# Patient Record
Sex: Female | Born: 1985 | Race: White | Hispanic: No | Marital: Married | Smoking: Never smoker
Health system: Southern US, Community
[De-identification: ages and names within clinical notes are randomized; demographics above are authoritative.]

## PROBLEM LIST (undated history)

## (undated) ENCOUNTER — Inpatient Hospital Stay (HOSPITAL_COMMUNITY): Payer: Self-pay

## (undated) DIAGNOSIS — E119 Type 2 diabetes mellitus without complications: Secondary | ICD-10-CM

## (undated) DIAGNOSIS — I10 Essential (primary) hypertension: Secondary | ICD-10-CM

## (undated) HISTORY — DX: Essential (primary) hypertension: I10

## (undated) HISTORY — PX: WISDOM TOOTH EXTRACTION: SHX21

## (undated) HISTORY — DX: Type 2 diabetes mellitus without complications: E11.9

---
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
## 2012-08-11 DIAGNOSIS — M25859 Other specified joint disorders, unspecified hip: Secondary | ICD-10-CM | POA: Insufficient documentation

## 2012-08-11 DIAGNOSIS — M7070 Other bursitis of hip, unspecified hip: Secondary | ICD-10-CM | POA: Insufficient documentation

## 2012-08-11 DIAGNOSIS — S73199A Other sprain of unspecified hip, initial encounter: Secondary | ICD-10-CM | POA: Insufficient documentation

## 2012-08-11 DIAGNOSIS — M25559 Pain in unspecified hip: Secondary | ICD-10-CM | POA: Insufficient documentation

## 2013-05-16 DIAGNOSIS — J343 Hypertrophy of nasal turbinates: Secondary | ICD-10-CM | POA: Insufficient documentation

## 2014-02-09 HISTORY — PX: DE QUERVAIN'S RELEASE: SHX1439

## 2014-03-14 DIAGNOSIS — E109 Type 1 diabetes mellitus without complications: Secondary | ICD-10-CM | POA: Insufficient documentation

## 2014-03-14 DIAGNOSIS — H30042 Focal chorioretinal inflammation, macular or paramacular, left eye: Secondary | ICD-10-CM | POA: Insufficient documentation

## 2014-03-30 DIAGNOSIS — E785 Hyperlipidemia, unspecified: Secondary | ICD-10-CM | POA: Insufficient documentation

## 2015-02-18 ENCOUNTER — Other Ambulatory Visit: Payer: Self-pay

## 2015-02-18 VITALS — BP 122/88 | HR 88 | Resp 16 | Ht 64.0 in | Wt 184.0 lb

## 2015-02-18 DIAGNOSIS — I10 Essential (primary) hypertension: Secondary | ICD-10-CM | POA: Insufficient documentation

## 2015-02-18 DIAGNOSIS — E109 Type 1 diabetes mellitus without complications: Secondary | ICD-10-CM

## 2015-02-18 NOTE — Patient Instructions (Signed)
1. Plan to meet with Jenita SeashoreBev Paddock RD at [facility] 03/14/15.   2. Plan to eat 30-45 GM (2-3) servings of carbohydrate a meal and 15 GM for snacks.  Plan to eat protein with your snacks 3. Plan to check blood sugars fasting and before meal, bedtime and as needed.  Goals of 80-130 fasting and 180 or less after eating. 4. Plan to do work out DVD twice a week for 90 minutes 5. Plan to see Dr. Claiborne Riggber on 02/19/15 6. Plan to return to [facility] on 04/23/15 at [facility]4PM

## 2015-02-18 NOTE — Patient Outreach (Signed)
[facility]) Care Management   02/18/2015  Valerie ButtStephanie Horwitz Mar 26, 1985 [id number]  Valerie ButtStephanie Wachob is an 30 y.o. female.  Member seen for initial office visit for Link to Wellness program for self management of Type 1 diabetes  Subjective: Member states that she would like to get back on the insulin pump and get her blood sugars under better control as she and her husband are wanting to start a family.  States she was on the pump in the past and she had better control when she was on the pump.  States it has been years since she was on a pump and she would like to get the new Medtronic pump if possible.  States that her blood sugars are higher during the day and low in the morning.  States she is to see her MD tomorrow to discuss her blood sugar levels and to talk about getting started on the pump.  States she does give herself a correction bolus of insulin every time she eats and she feels is doing a good job of counting her CHO.  States that she needs to watch her portion sizes at dinner.  States she had been running 3 times a week before the holidays but she has stopped doing much exercise in the last month or so.    Objective:   Review of Systems  All other systems reviewed and are negative.   Physical Exam Today's Vitals   02/18/15 1413  BP: 122/88  Pulse: 88  Resp: 16  Height: 1.626 m (5\' 4" )  Weight: 184 lb (83.462 kg)  SpO2: 99%  PainSc: 0-No pain   Current Medications:   Current Outpatient Prescriptions  Medication Sig Dispense Refill  . insulin aspart (NOVOLOG) 100 UNIT/ML injection Inject 1 Units into the skin 3 (three) times daily with meals. Amount varies on blood sugar and CHO correction    . insulin glargine (LANTUS) 100 UNIT/ML injection Inject 50 Units into the skin at bedtime.    . methyldopa (ALDOMET) 250 MG tablet Take 250 mg by mouth 2 (two) times daily.    . mometasone (NASONEX) 50 MCG/ACT nasal spray Place 2 sprays into the nose daily.     No  current facility-administered medications for this visit.    Functional Status:   In your present state of health, do you have any difficulty performing the following activities: 02/18/2015  Hearing? N  Vision? N  Difficulty concentrating or making decisions? N  Walking or climbing stairs? N  Dressing or bathing? N  Doing errands, shopping? N    Fall/Depression Screening:    PHQ 2/9 Scores 02/18/2015  PHQ - 2 Score 0    Assessment:  Member seen for initial office visit for Link to Wellness program for self management of Type 1 diabetes.  Member is above self management goal of hemoglobin A1C of  7% with last reading 8%.  Member has good knowledge of CHO counting and disease management.  Reports needing to improve blood sugars and to increase exercise.  She desires to become pregnant and would like to get on insulin pump to help improve her glycemic control.  Link to Wellness pharmacy benefits activated as she has good knowledge of diabetes and she has an upcoming appointment with Jenita SeashoreBev Paddock RD, CDE.  Plan:   Plan to meet with Jenita SeashoreBev Paddock RD [facility].   Plan to eat 30-45 GM (2-3) servings of carbohydrate a meal and 15 GM for  snacks.  Plan to eat protein with your snacks Plan to check blood sugars fasting and before meal, bedtime and as needed.  Goals of 80-130 fasting and 180 or less after eating. Plan to do work out DVD twice a week for 90 minutes Plan to see Dr. Claiborne Rigg on 02/19/15 to discuss insulin pump options Plan to return to [facility] to Wellness on 04/23/15 at 4PM  [facility] CM Care Plan Problem One        Most Recent Value   Care Plan Problem One  Elevated blood sugars related to dx of Type 1 DM   Role Documenting the Problem One  Care Management Coordinator   Care Plan for Problem One  Active   [facility] Long Term Goal (31-90 days)  Member will decrease hemoglobin A1C to 7 or below in the next 90 days   [facility] Long Term Goal Start Date  02/18/15    Interventions for Problem One Long Term Goal  Given Link to [facility]ness diabetes education teaching packet and reviewed program requirements, Instructed on CHO counting and portion control, Instructed about the insulin pump benefit with Link to Wellness and encouraged to discuss with MD about going back on the insulin pump, Instructed to keep her appointment with Jenita Seashore RD on 03/14/15 and to have her discuss the process of getting started on the insulin pump, Instructed on importance of having her blood sugars in good control if she is planning in trying to get pregnant, Instructed on importance of regular exercise for glycemic control    [name] RN, [facility] Care Management Coordinator-Link to Wellness [facility] Care Management [phone]

## 2015-02-19 DIAGNOSIS — E1065 Type 1 diabetes mellitus with hyperglycemia: Secondary | ICD-10-CM | POA: Diagnosis not present

## 2015-02-21 MED FILL — ACCU-CHEK MULTICLIX LANCETS: 39 days supply | Qty: 306 | Fill #0

## 2015-02-21 MED FILL — BD PEN NDL NANO 32GX5/32: 32G X 4 MM | 30 days supply | Qty: 300 | Fill #0

## 2015-02-21 MED FILL — CONTOUR NEXT STRIPS: 38 days supply | Qty: 300 | Fill #0

## 2015-02-21 MED FILL — LANTUS SOLOSTAR 100 UNITS/M: 100 | 60 days supply | Qty: 30 | Fill #0

## 2015-02-25 MED FILL — NOVOLOG FLEXPEN SYRINGE: 100 | 30 days supply | Qty: 30 | Fill #0

## 2015-02-25 MED FILL — GLUCAGON 1 MG EMERGENCY KIT: 1 | 30 days supply | Qty: 2 | Fill #0

## 2015-03-05 DIAGNOSIS — E108 Type 1 diabetes mellitus with unspecified complications: Secondary | ICD-10-CM | POA: Diagnosis not present

## 2015-03-14 ENCOUNTER — Encounter: Payer: Self-pay | Admitting: *Deleted

## 2015-03-14 ENCOUNTER — Encounter: Payer: 59 | Attending: Endocrinology | Admitting: *Deleted

## 2015-03-14 DIAGNOSIS — E109 Type 1 diabetes mellitus without complications: Secondary | ICD-10-CM

## 2015-03-14 DIAGNOSIS — Z713 Dietary counseling and surveillance: Secondary | ICD-10-CM | POA: Diagnosis not present

## 2015-03-20 MED FILL — MOMETASONE FUROATE 50 MCG S: 50 | 30 days supply | Qty: 17 | Fill #0

## 2015-03-20 NOTE — Progress Notes (Signed)
CGM Training:  Appt start time: 1700 end time:1830.  Assessment:  Primary concerns today: Patient here for initiation of Medtronic Continuous Glucose Monitoring.   Medications: see list     Intervention:   Understanding Glucose Sensing Programming Sensor Information  High Glucose: 300 mg/dl  OFF  Low Glucose 80 mg/dl  ON  Other settings to be added at follow up visit Starting [employer] of Product  Entering BG, Calibration Technique and Graphs with Public librarian and Alarms   Follow Up Patient to see MD for insulin dose adjustments and to schedule visit with me for CGM follow up within 2 week(s).

## 2015-03-20 NOTE — Progress Notes (Signed)
Diabetes Self-Management Education  Visit Type: First/Initial  Appt. Start Time: 1500 Appt. End Time: 1630  03/20/2015  Ms. Valerie Dennis, identified by name and date of birth, is a 30 y.o. female with a diagnosis of Diabetes: Type 1.   ASSESSMENT  Last menstrual period 01/11/2015. There is no weight on file to calculate BMI.      Diabetes Self-Management Education - 03/14/15 1505    Visit Information   Visit Type First/Initial   Initial Visit   Diabetes Type Type 1   Are you currently following a meal plan? No   Are you taking your medications as prescribed? Yes   Date Diagnosed age 53, 13 years ago   Health Coping   How would you rate your overall health? Fair   Psychosocial Assessment   Patient Belief/Attitude about Diabetes Defeat/Burnout   Self-care barriers None   Self-management support Family   Other persons present Patient;Spouse/SO   Patient Concerns Glycemic Control   Special Needs None   Preferred Learning Style Auditory   What is the last grade level you completed in school? Bachelor's degree   Complications   Last HgB A1C per patient/outside source 8 %   How often do you check your blood sugar? > 4 times/day   Number of hypoglycemic episodes per month 20   Can you tell when your blood sugar is low? Yes  sometimes not   What do you do if your blood sugar is low? juice box 4 oz   Have you had a dilated eye exam in the past 12 months? Yes   Have you had a dental exam in the past 12 months? Yes   Are you checking your feet? Yes   How many days per week are you checking your feet? 7   Dietary Intake   Breakfast usually - egg and cheese croissant at work, occasionally grapes   Snack (morning) no   Lunch [place] sandwich OR soup,    Snack (afternoon) no   Dinner cooking at home more often; lean meat, steamable vegetables,    Beverage(s) diet Coke, diet Sprite anytime after lunch, flavored carbonated water, wine   Exercise   Exercise Type Light (walking /  raking leaves)   How many days per week to you exercise? 2   How many minutes per day do you exercise? 60   Total minutes per week of exercise 120   Patient Education   Previous Diabetes Education Yes (please comment)  when diagnosed 13 years ago   Disease state  Definition of diabetes, type 1 and 2, and the diagnosis of diabetes   Nutrition management  Role of diet in the treatment of diabetes and the relationship between the three main macronutrients and blood glucose level   Physical activity and exercise  Role of exercise on diabetes management, blood pressure control and cardiac health.   Medications Other (comment)  On insulin pump, would like to start CGM ASAP   Preconception care Reviewed with patient blood glucose goals with pregnancy   Individualized Goals (developed by patient)   Nutrition Other (comment)  Confortable with Carb Counting in grams, introduced Carb Counting with Food Groups for restaurant meals   Medications take my medication as prescribed   Monitoring  test blood glucose pre and post meals as discussed  Plans to start CGM today if possible   Health Coping Other (comment)  Patient states she has good support from her husband who was here today too.   Outcomes  Expected Outcomes Demonstrated interest in learning. Expect positive outcomes   Future DMSE 2 wks   Program Status Not Completed      Individualized Plan for Diabetes Self-Management Training:   Learning Objective:  Patient will have a greater understanding of diabetes self-management. Patient education plan is to attend individual and/or group sessions per assessed needs and concerns.   Plan:   Patient Instructions  Plan:  Continue to count your carbs and enter into insulin pump for accurate bolusing at meals  Include protein in moderation with your meals and snacks Continue reading food labels for Total Carbohydrate of foods Continue checking BG at alternate times per day as directed by MD   Continue taking medication as directed by MD Consider attending DM 1/Pump Support Group meetings for ongoing education and social support.       Expected Outcomes:  Demonstrated interest in learning. Expect positive outcomes  Education material provided: Living Well with Diabetes, A1C conversion sheet, Meal plan card, Support group flyer and Carbohydrate counting sheet  If problems or questions, patient to contact team via:  Phone and Email  Future DSME appointment: 2 wks  Patient plans to come back in 1 hour for training on CGM today.

## 2015-03-20 NOTE — Patient Instructions (Signed)
Plan:  Continue to count your carbs and enter into insulin pump for accurate bolusing at meals  Include protein in moderation with your meals and snacks Continue reading food labels for Total Carbohydrate of foods Continue checking BG at alternate times per day as directed by MD  Continue taking medication as directed by MD Consider attending DM 1/Pump Support Group meetings for ongoing education and social support.

## 2015-03-25 MED FILL — NOVOLOG FLEXPEN SYRINGE: 100 | 30 days supply | Qty: 30 | Fill #1

## 2015-03-25 MED FILL — ACCU-CHEK MULTICLIX LANCETS: 39 days supply | Qty: 306 | Fill #1

## 2015-03-25 MED FILL — CONTOUR NEXT STRIPS: 38 days supply | Qty: 300 | Fill #1

## 2015-03-25 MED FILL — BD PEN NDL NANO 32GX5/32: 32G X 4 MM | 30 days supply | Qty: 300 | Fill #1

## 2015-04-04 ENCOUNTER — Ambulatory Visit: Payer: 59 | Admitting: *Deleted

## 2015-04-22 MED FILL — BD PEN NDL NANO 32GX5/32: 32G X 4 MM | 30 days supply | Qty: 300 | Fill #2

## 2015-04-22 MED FILL — ACCU-CHEK MULTICLIX LANCETS: 39 days supply | Qty: 306 | Fill #2

## 2015-04-22 MED FILL — CONTOUR NEXT STRIPS: 38 days supply | Qty: 300 | Fill #2

## 2015-04-22 MED FILL — MOMETASONE FUROATE 50 MCG S: 50 | 30 days supply | Qty: 17 | Fill #1

## 2015-04-22 MED FILL — LANTUS SOLOSTAR 100 UNITS/M: 100 | 60 days supply | Qty: 30 | Fill #1

## 2015-04-22 MED FILL — NOVOLOG FLEXPEN SYRINGE: 100 | 30 days supply | Qty: 30 | Fill #2

## 2015-04-23 ENCOUNTER — Other Ambulatory Visit: Payer: Self-pay

## 2015-04-23 VITALS — BP 110/60 | HR 84 | Resp 16 | Ht 64.0 in | Wt 189.2 lb

## 2015-04-23 DIAGNOSIS — E109 Type 1 diabetes mellitus without complications: Secondary | ICD-10-CM

## 2015-04-23 NOTE — Patient Outreach (Signed)
[facility]) Care Management   04/23/2015  Valerie Dennis 02-Jul-1985 [id number]  Valerie Dennis is an 30 y.o. female.   Member seen for follow up office visit for Link to Wellness program for self management of Type 1 diabetes  Subjective: Member states that she now has her Medtronic insulin pump and continuous glucose monitor.  States that Pul[employer] Homes[employer] Paddock Dennis helped her get started again on the pump.  States that the first few weeks were an adjustment for using the continuous glucose monitor and getting her alarms set.  States that she is having less lows and high readings now.  States she does not have a follow up appointment with the Dennis and she needs to call to schedule.  States she has been exercising more and she is doing her workout videos twice a week.  States she did go [place] class at [place] and she plans to start running again when the weather is warmer.  Objective:   Review of Systems  All other systems reviewed and are negative.   Physical Exam Today's Vitals   04/23/15 1606  BP: 110/60  Pulse: 84  Resp: 16  Height: 1.626 m (5\' 4" )  Weight: 189 lb 3.2 oz (85.821 kg)  SpO2: 98%  PainSc: 0-No pain   Current Medications:   Current Outpatient Prescriptions  Medication Sig Dispense Refill  . glucagon (GLUCAGON EMERGENCY) 1 MG injection Inject 1 mg into the vein once as needed.    . insulin aspart (NOVOLOG) 100 UNIT/ML injection Inject 1 Units into the skin continuous. Insulin pump    . methyldopa (ALDOMET) 250 MG tablet Take 250 mg by mouth 2 (two) times daily.    . mometasone (NASONEX) 50 MCG/ACT nasal spray Place 2 sprays into the nose daily.    . insulin glargine (LANTUS) 100 UNIT/ML injection Inject 50 Units into the skin at bedtime. Reported on 04/23/2015     No current facility-administered medications for this visit.    Functional Status:   In your present state of health, do you have any difficulty performing the following  activities: 04/23/2015 02/18/2015  Hearing? N N  Vision? N N  Difficulty concentrating or making decisions? N N  Walking or climbing stairs? N N  Dressing or bathing? N N  Doing errands, shopping? N N    Fall/Depression Screening:    PHQ 2/9 Scores 04/23/2015 03/14/2015 02/18/2015  PHQ - 2 Score 0 0 0    Assessment:   Member seen for follow up office visit for Link to Wellness program for self management of Type 1 diabetes.  Member has not had her hemoglobin A1C checked since MD visit in December.  She is not meeting goal of below 7% with last hemoglobin A1C of 8%.  Member now is wearing the Medtronic MiniMed 630G with Enlite sensor.  Her 30 day average has been 186.  Member has not followed up with pump trainer since starting pump.  Member is watching her CHO intake and giving her bolus accordingly.  She has started exercising 2-3 times a week.    Plan:   Plan to call to schedule follow up appointment with  [facility]  [phone] Plan to eat 30-45 GM (2-3) servings of carbohydrate a meal and 15 GM for snacks.  Plan to eat protein with your snacks Plan to do work out video twice a week for 90 minutes and go to gym or run  one day a week Plan to return to [facility] on 06/24/15 at [facility]   [facility] CM Care Plan Problem One        Most Recent Value   Care Plan Problem One  Elevated blood sugars related to dx of Type 1 DM   Role Documenting the Problem One  Care Management Coordinator   Care Plan for Problem One  Active   [facility] Long Term Goal (31-90 days)  Member will decrease hemoglobin A1C to 7 or below in the next 90 days   [facility] Long Term Goal Start Date  04/23/15 [Not due to have hemoglobin A1C rechecked yet]   Interventions for Problem One Long Term Goal  Reinforced CHO counting and portion control and to try to give insulin bolus based on her CHO intake and correction factor, Instructed to call to schedule a follow up  appointment with Valerie Dennis,  Reinforced on importance of regular exercise for glycemic control     [name] RN, [facility] Care Management Coordinator-Link to Wellness [facility] Care Management [phone]

## 2015-04-23 NOTE — Patient Instructions (Addendum)
1. Plan to call to schedule follow up appointment with  Jenita SeashoreBev Paddock RD at [facility]  [phone] 2. Plan to eat 30-45 GM (2-3) servings of carbohydrate a meal and 15 GM for snacks.  Plan to eat protein with your snacks 3. Plan to do work out video twice a week for 90 minutes and go to gym or run one day a week 4. Plan to return to [facility] on 06/24/15 at [facility]4PM

## 2015-04-29 DIAGNOSIS — E108 Type 1 diabetes mellitus with unspecified complications: Secondary | ICD-10-CM | POA: Diagnosis not present

## 2015-04-29 MED FILL — ZOLPIDEM TARTRATE 5 MG TAB: 5 | 20 days supply | Qty: 20 | Fill #0

## 2015-05-01 DIAGNOSIS — I10 Essential (primary) hypertension: Secondary | ICD-10-CM | POA: Diagnosis not present

## 2015-05-01 DIAGNOSIS — F909 Attention-deficit hyperactivity disorder, unspecified type: Secondary | ICD-10-CM | POA: Diagnosis not present

## 2015-05-01 DIAGNOSIS — B349 Viral infection, unspecified: Secondary | ICD-10-CM | POA: Diagnosis not present

## 2015-05-01 DIAGNOSIS — E1065 Type 1 diabetes mellitus with hyperglycemia: Secondary | ICD-10-CM | POA: Diagnosis not present

## 2015-05-02 MED FILL — D-AMPHETAMINE ER 15 MG CAP: 15 | 30 days supply | Qty: 120 | Fill #0

## 2015-05-09 MED FILL — IPRATROPIUM 0.03% SPRAY: 0.03 | 87 days supply | Qty: 30 | Fill #0

## 2015-05-22 ENCOUNTER — Ambulatory Visit: Payer: 59 | Admitting: *Deleted

## 2015-05-24 MED FILL — MOMETASONE FUROATE 50 MCG S: 50 | 30 days supply | Qty: 17 | Fill #2

## 2015-05-24 MED FILL — NOVOLOG FLEXPEN SYRINGE: 100 | 30 days supply | Qty: 30 | Fill #3

## 2015-05-24 MED FILL — CONTOUR NEXT STRIPS: 38 days supply | Qty: 300 | Fill #3

## 2015-05-28 MED FILL — SF 5000 PLUS CREAM: 1.1 | 30 days supply | Qty: 51 | Fill #0

## 2015-05-29 ENCOUNTER — Ambulatory Visit: Payer: 59 | Admitting: *Deleted

## 2015-06-05 DIAGNOSIS — E108 Type 1 diabetes mellitus with unspecified complications: Secondary | ICD-10-CM | POA: Diagnosis not present

## 2015-06-24 ENCOUNTER — Other Ambulatory Visit: Payer: Self-pay

## 2015-06-24 VITALS — BP 112/74 | HR 64 | Resp 16 | Ht 64.0 in | Wt 184.2 lb

## 2015-06-24 DIAGNOSIS — E109 Type 1 diabetes mellitus without complications: Secondary | ICD-10-CM

## 2015-06-24 LAB — POCT GLYCOSYLATED HEMOGLOBIN (HGB A1C): Hemoglobin A1C: 7.3

## 2015-06-24 NOTE — Patient Outreach (Signed)
[facility]) Care Management   06/24/2015  Valerie Dennis 06/08/1985 [id number]  Valerie Dennis is an 30 y.o. female.   Member seen for follow up office visit for Link to Wellness program for self management of Type 1 diabetes  Subjective: Member states that she really likes using her insulin pump and sensor now that she has learned how to program it to help alert her for lows and highs.  States that she is just returned from a week long hiking retreat in [place].  States that she only went low one time during the trip even though she was hiking for miles every day.  States that since she started wearing her pump and sensor she has had fewer lows and fewer high readings.  States that she plans to start running at least 2 times a week along with her workout video.  States she tries to eat healthy and count her CHO.  States she is to see Valerie Dennis on 07/10/15 and Dr.Ober in July.    Objective:   Review of Systems  All other systems reviewed and are negative. POC HemoglobinA1C- 7.3%  Physical Exam Today's Vitals   06/24/15 1606  BP: 112/74  Pulse: 64  Resp: 16  Height: 1.626 m ( )  Weight: 184 lb 3.2 oz (83.553 kg)  SpO2: 98%  PainSc: 0-No pain   Encounter Medications:   Outpatient Encounter Prescriptions as of 06/24/2015  Medication Sig  . glucagon (GLUCAGON EMERGENCY) 1 MG injection Inject 1 mg into the vein once as needed.  . insulin aspart (NOVOLOG) 100 UNIT/ML injection Inject 1 Units into the skin continuous. Insulin pump  . insulin glargine (LANTUS) 100 UNIT/ML injection Inject 50 Units into the skin at bedtime. Reported on 04/23/2015  . ipratropium (ATROVENT) 0.03 % nasal spray Place 2 sprays into both nostrils 2 (two) times daily as needed for rhinitis.  . methyldopa (ALDOMET) 250 MG tablet Take 250 mg by mouth 2 (two) times daily.  . mometasone (NASONEX) 50 MCG/ACT nasal spray Place 2 sprays into the nose daily.   No facility-administered encounter  medications on file as of 06/24/2015.    Functional Status:   In your present state of health, do you have any difficulty performing the following activities: 06/24/2015 04/23/2015  Hearing? N N  Vision? N N  Difficulty concentrating or making decisions? N N  Walking or climbing stairs? N N  Dressing or bathing? N N  Doing errands, shopping? N N    Fall/Depression Screening:    PHQ 2/9 Scores 06/24/2015 04/23/2015 03/14/2015 02/18/2015  PHQ - 2 Score 0 0 0 0    Assessment:  Member seen for follow up office visit for Link to Wellness program for self management of Type 1 diabetes. She is not meeting  Hemoglobin A1C goal of below 7% with  hemoglobin A1C of 7.3% today which is down from 8%. Member  wearing the Medtronic MiniMed 630G with Enlite sensor. Member reports fewer episodes of hypoglycmia and hyperglycemia since starting the pump. Member is to  followed up with pump trainer on 07/10/15. Member is watching her CHO intake and giving her bolus accordingly. She has started exercising 2-3 times a week and just returned from a strenuous hiking vacation.   Plan:  Plan to  keep appointment with  Valerie Dennis on 07/10/15 Plan to eat 30-45 GM (2-3) servings of carbohydrate a meal and 15 GM for snacks.  Plan to eat protein with your snacks Plan to do  work out DVD once a week for 90 minutes and go run twice a week for 45 minutes. Plan to return to [facility] on 10/28/15 [facility] CM Care Plan Problem One        Most Recent Value   Care Plan Problem One  Elevated blood sugars related to dx of Type 1 DM   Role Documenting the Problem One  Care Management Coordinator   Care Plan for Problem One  Active   [facility] Long Term Goal (31-90 days)  Member will decrease hemoglobin A1C to 7 or below in the next 90 days   [facility] Long Term Goal Start Date  06/24/15 [Continue hemoglobin A1C down to 7.3%]   Interventions for Problem One Long Term Goal  Reinforced CHO counting and portion control and to try to  give insulin bolus based on her CHO intake and correction factor, Encouraged to eat snacks with protein and 15 gm of CHO, Reinforced actions to take for hypoglycemia, Praised for wearing sensor, Instructed to discuss with Valerie [employer] HeightsPadd[employer] Dennis about different sites she can use for her sensor and insulin pump insertions, Reinforced to keep appt with Valerie on 07/10/15 Reinforced on importance of regular exercise for glycemic control    Dudley MajorMelissa Sandlin RN, [facility] Care Management Coordinator-[facility] to [facility] Care [facility] [phone]

## 2015-06-25 NOTE — Patient Instructions (Signed)
1. Plan to  keep appointment with  Jenita SeashoreBev Paddock RD on 07/10/15 2. Plan to eat 30-45 GM (2-3) servings of carbohydrate a meal and 15 GM for snacks.  Plan to eat protein with your snacks 3. Plan to do work out DVD once a week for 90 minutes and go run twice a week for 45 minutes. 4. Plan to return to Link to Wellness on 10/28/15 at [facility]4PM

## 2015-06-27 MED FILL — MOMETASONE FUROATE 50 MCG S: 50 | 30 days supply | Qty: 17 | Fill #3

## 2015-06-27 MED FILL — NOVOLOG FLEXPEN SYRINGE: 100 | 30 days supply | Qty: 30 | Fill #4

## 2015-06-27 MED FILL — CONTOUR NEXT STRIPS: 38 days supply | Qty: 300 | Fill #4

## 2015-07-01 DIAGNOSIS — Z8249 Family history of ischemic heart disease and other diseases of the circulatory system: Secondary | ICD-10-CM | POA: Diagnosis not present

## 2015-07-01 DIAGNOSIS — H5213 Myopia, bilateral: Secondary | ICD-10-CM | POA: Diagnosis not present

## 2015-07-01 DIAGNOSIS — H30041 Focal chorioretinal inflammation, macular or paramacular, right eye: Secondary | ICD-10-CM | POA: Diagnosis not present

## 2015-07-01 DIAGNOSIS — H52223 Regular astigmatism, bilateral: Secondary | ICD-10-CM | POA: Diagnosis not present

## 2015-07-01 DIAGNOSIS — Z83511 Family history of glaucoma: Secondary | ICD-10-CM | POA: Diagnosis not present

## 2015-07-01 DIAGNOSIS — Z794 Long term (current) use of insulin: Secondary | ICD-10-CM | POA: Diagnosis not present

## 2015-07-01 DIAGNOSIS — Z973 Presence of spectacles and contact lenses: Secondary | ICD-10-CM | POA: Diagnosis not present

## 2015-07-01 DIAGNOSIS — E103293 Type 1 diabetes mellitus with mild nonproliferative diabetic retinopathy without macular edema, bilateral: Secondary | ICD-10-CM | POA: Diagnosis not present

## 2015-07-01 DIAGNOSIS — H40013 Open angle with borderline findings, low risk, bilateral: Secondary | ICD-10-CM | POA: Diagnosis not present

## 2015-07-01 MED FILL — METHYLDOPA 250 MG TABLET: 250 | 30 days supply | Qty: 60 | Fill #0

## 2015-07-10 ENCOUNTER — Ambulatory Visit: Payer: 59 | Admitting: *Deleted

## 2015-07-11 DIAGNOSIS — E108 Type 1 diabetes mellitus with unspecified complications: Secondary | ICD-10-CM | POA: Diagnosis not present

## 2015-07-14 DIAGNOSIS — E103293 Type 1 diabetes mellitus with mild nonproliferative diabetic retinopathy without macular edema, bilateral: Secondary | ICD-10-CM | POA: Insufficient documentation

## 2015-07-15 DIAGNOSIS — E108 Type 1 diabetes mellitus with unspecified complications: Secondary | ICD-10-CM | POA: Diagnosis not present

## 2015-07-15 DIAGNOSIS — Z794 Long term (current) use of insulin: Secondary | ICD-10-CM | POA: Diagnosis not present

## 2015-07-29 DIAGNOSIS — E108 Type 1 diabetes mellitus with unspecified complications: Secondary | ICD-10-CM | POA: Diagnosis not present

## 2015-08-05 DIAGNOSIS — E103293 Type 1 diabetes mellitus with mild nonproliferative diabetic retinopathy without macular edema, bilateral: Secondary | ICD-10-CM | POA: Diagnosis not present

## 2015-08-05 DIAGNOSIS — Z794 Long term (current) use of insulin: Secondary | ICD-10-CM | POA: Diagnosis not present

## 2015-08-05 DIAGNOSIS — H30041 Focal chorioretinal inflammation, macular or paramacular, right eye: Secondary | ICD-10-CM | POA: Diagnosis not present

## 2015-08-05 DIAGNOSIS — H40013 Open angle with borderline findings, low risk, bilateral: Secondary | ICD-10-CM | POA: Diagnosis not present

## 2015-08-05 DIAGNOSIS — E10649 Type 1 diabetes mellitus with hypoglycemia without coma: Secondary | ICD-10-CM | POA: Diagnosis not present

## 2015-08-05 DIAGNOSIS — Z83511 Family history of glaucoma: Secondary | ICD-10-CM | POA: Diagnosis not present

## 2015-08-07 ENCOUNTER — Ambulatory Visit: Payer: 59 | Admitting: *Deleted

## 2015-08-16 DIAGNOSIS — E108 Type 1 diabetes mellitus with unspecified complications: Secondary | ICD-10-CM | POA: Diagnosis not present

## 2015-08-20 DIAGNOSIS — E108 Type 1 diabetes mellitus with unspecified complications: Secondary | ICD-10-CM | POA: Diagnosis not present

## 2015-09-02 MED FILL — CONTOUR NEXT STRIPS: 38 days supply | Qty: 300 | Fill #5

## 2015-09-04 ENCOUNTER — Encounter: Payer: 59 | Attending: Endocrinology | Admitting: *Deleted

## 2015-09-04 DIAGNOSIS — Z713 Dietary counseling and surveillance: Secondary | ICD-10-CM | POA: Diagnosis not present

## 2015-09-04 DIAGNOSIS — E109 Type 1 diabetes mellitus without complications: Secondary | ICD-10-CM

## 2015-09-11 ENCOUNTER — Encounter: Payer: Self-pay | Admitting: *Deleted

## 2015-09-11 NOTE — Progress Notes (Signed)
  Pump Follow Up Progress Note 09/04/2015 Start time: 1630 End time: 1700  I plan to fax request to Dr. Saddie Benders for orders giving me permission to make insulin pump adjustments for this patient if he agrees.  Patient has upgraded to the Medtronic 670G since I last saw her and she has used in in Auto Mode for a little over a week. She is more pleased with this sensor product's accuracy. She is concerned over having delivery interruptions with bent cannulas. She states she has just found out she is pregnant and would like to follow up more often here to monitor her BG's very closely. She is aware that Auto Mode is not approved for pregnancy, so she is no longer in Auto Mode at this time. She is using the 670G pump and Low Glucose Suspend features on the pump.   Reviewed blood glucose logs on 09/04/15 via: [facility]                   Plan:  Patient requests that I ask her endocrinologist for permission to work with her on BG and pump management, so I am faxing orders for his signature. Patient agrees to use Bolus Wizard for meal boluses at every meal and snack Patient agrees to upload to [facility] as needed for BG review remotely We discussed other infusion set options including the SureT. She would like to try that set if she has any more problems with bent cannula.   Follow up: 1 month, after she sees Dr. Saddie Benders, her endocrinologist on 09/24/2015

## 2015-09-20 MED FILL — NOVOLOG FLEXPEN SYRINGE: 100 | 30 days supply | Qty: 30 | Fill #5

## 2015-09-23 ENCOUNTER — Ambulatory Visit (INDEPENDENT_AMBULATORY_CARE_PROVIDER_SITE_OTHER): Payer: 59 | Admitting: Obstetrics & Gynecology

## 2015-09-23 ENCOUNTER — Encounter: Payer: Self-pay | Admitting: Obstetrics & Gynecology

## 2015-09-23 VITALS — BP 121/78 | HR 84 | Wt 189.0 lb

## 2015-09-23 DIAGNOSIS — O10911 Unspecified pre-existing hypertension complicating pregnancy, first trimester: Secondary | ICD-10-CM

## 2015-09-23 DIAGNOSIS — Z3490 Encounter for supervision of normal pregnancy, unspecified, unspecified trimester: Secondary | ICD-10-CM

## 2015-09-23 DIAGNOSIS — Z113 Encounter for screening for infections with a predominantly sexual mode of transmission: Secondary | ICD-10-CM

## 2015-09-23 DIAGNOSIS — Z794 Long term (current) use of insulin: Secondary | ICD-10-CM

## 2015-09-23 DIAGNOSIS — O0991 Supervision of high risk pregnancy, unspecified, first trimester: Secondary | ICD-10-CM

## 2015-09-23 DIAGNOSIS — E109 Type 1 diabetes mellitus without complications: Secondary | ICD-10-CM

## 2015-09-23 DIAGNOSIS — Z36 Encounter for antenatal screening of mother: Secondary | ICD-10-CM | POA: Diagnosis not present

## 2015-09-23 DIAGNOSIS — Z9641 Presence of insulin pump (external) (internal): Secondary | ICD-10-CM

## 2015-09-23 DIAGNOSIS — O24011 Pre-existing diabetes mellitus, type 1, in pregnancy, first trimester: Secondary | ICD-10-CM

## 2015-09-23 NOTE — Progress Notes (Signed)
Bedside ultrasound reveals CRL c/w 7 weeks and 5 days. FHR 176. Armandina StammerJennifer Howard RNBSN

## 2015-09-24 DIAGNOSIS — E1065 Type 1 diabetes mellitus with hyperglycemia: Secondary | ICD-10-CM | POA: Diagnosis not present

## 2015-09-24 LAB — COMPREHENSIVE METABOLIC PANEL
ALT: 16 U/L (ref 6–29)
AST: 15 U/L (ref 10–30)
Albumin: 4 g/dL (ref 3.6–5.1)
Alkaline Phosphatase: 56 U/L (ref 33–115)
BUN: 13 mg/dL (ref 7–25)
CO2: 22 mmol/L (ref 20–31)
Calcium: 9.2 mg/dL (ref 8.6–10.2)
Chloride: 104 mmol/L (ref 98–110)
Creat: 0.65 mg/dL (ref 0.50–1.10)
Glucose, Bld: 178 mg/dL — ABNORMAL HIGH (ref 65–99)
Potassium: 3.6 mmol/L (ref 3.5–5.3)
Sodium: 136 mmol/L (ref 135–146)
Total Bilirubin: 0.3 mg/dL (ref 0.2–1.2)
Total Protein: 6.7 g/dL (ref 6.1–8.1)

## 2015-09-24 LAB — OBSTETRIC PANEL
Antibody Screen: NEGATIVE
Basophils Absolute: 0 cells/uL (ref 0–200)
Basophils Relative: 0 %
Eosinophils Absolute: 107 cells/uL (ref 15–500)
Eosinophils Relative: 1 %
HCT: 37.2 % (ref 35.0–45.0)
Hemoglobin: 12.5 g/dL (ref 11.7–15.5)
Hepatitis B Surface Ag: NEGATIVE
Lymphocytes Relative: 25 %
Lymphs Abs: 2675 cells/uL (ref 850–3900)
MCH: 30.4 pg (ref 27.0–33.0)
MCHC: 33.6 g/dL (ref 32.0–36.0)
MCV: 90.5 fL (ref 80.0–100.0)
MPV: 10.7 fL (ref 7.5–12.5)
Monocytes Absolute: 642 cells/uL (ref 200–950)
Monocytes Relative: 6 %
Neutro Abs: 7276 cells/uL (ref 1500–7800)
Neutrophils Relative %: 68 %
Platelets: 287 10*3/uL (ref 140–400)
RBC: 4.11 MIL/uL (ref 3.80–5.10)
RDW: 13.7 % (ref 11.0–15.0)
Rh Type: POSITIVE
Rubella: 0.96 Index — ABNORMAL HIGH (ref <0.90)
WBC: 10.7 10*3/uL (ref 3.8–10.8)

## 2015-09-24 LAB — HIV ANTIBODY (ROUTINE TESTING W REFLEX): HIV 1&2 Ab, 4th Generation: NONREACTIVE

## 2015-09-24 LAB — LIPID PANEL
Cholesterol: 152 mg/dL (ref 125–200)
HDL: 66 mg/dL (ref >=46)
LDL Cholesterol: 69 mg/dL (ref <130)
Total CHOL/HDL Ratio: 2.3 Ratio (ref <=5.0)
Triglycerides: 83 mg/dL (ref <150)
VLDL: 17 mg/dL (ref <30)

## 2015-09-24 LAB — HEMOGLOBIN A1C
Hgb A1c MFr Bld: 7.2 % — ABNORMAL HIGH (ref <5.7)
Mean Plasma Glucose: 160 mg/dL

## 2015-09-25 ENCOUNTER — Encounter: Payer: Self-pay | Admitting: Obstetrics & Gynecology

## 2015-09-25 DIAGNOSIS — E108 Type 1 diabetes mellitus with unspecified complications: Secondary | ICD-10-CM | POA: Diagnosis not present

## 2015-09-25 DIAGNOSIS — O0990 Supervision of high risk pregnancy, unspecified, unspecified trimester: Secondary | ICD-10-CM | POA: Insufficient documentation

## 2015-09-25 DIAGNOSIS — Z794 Long term (current) use of insulin: Secondary | ICD-10-CM | POA: Diagnosis not present

## 2015-09-25 LAB — CULTURE, URINE COMPREHENSIVE

## 2015-09-25 LAB — CYTOLOGY - PAP

## 2015-09-25 LAB — GC/CHLAMYDIA PROBE AMP (~~LOC~~) NOT AT ARMC
Chlamydia: NEGATIVE
Neisseria Gonorrhea: NEGATIVE

## 2015-09-25 NOTE — Progress Notes (Signed)
Subjective:    Valerie Dennis is a G1P0 7667w5d being seen today for her first obstetrical visit.  Her obstetrical history is significant for hypertension, type 1 DM. Patient does intend to breast feed. Pregnancy history fully reviewed.  Patient reports no complaints.  Vitals:   09/23/15 1547  BP: 121/78  Pulse: 84  Weight: 189 lb (85.7 kg)    HISTORY: OB History  Gravida Para Term Preterm AB Living  1            SAB TAB Ectopic Multiple Live Births               # Outcome Date GA Lbr Len/2nd Weight Sex Delivery Anes PTL Lv  1 Current              Past Medical History:  Diagnosis Date  . Diabetes mellitus without complication (HCC)   . Hypertension    Past Surgical History:  Procedure Laterality Date  . CAUTERY OF TURBINATES  2003  . DE QUERVAIN'S RELEASE  2016   Family History  Problem Relation Age of Onset  . Hyperlipidemia Mother   . Rheum arthritis Mother   . Heart disease Father   . Hyperlipidemia Father   . Hypertension Father   . Stroke Father   . Hypertension Sister   . Heart disease Maternal Grandmother   . Heart disease Maternal Grandfather   . Heart disease Paternal Grandfather   . Hypertension Paternal Grandfather   . Diabetes Paternal Grandfather      Exam    Uterus:     Pelvic Exam:    Perineum: No Hemorrhoids   Vulva: normal   Vagina:  normal mucosa   pH: n/a   Cervix: no lesions   Adnexa: normal adnexa   Bony Pelvis: average  System: Breast:  normal appearance, no masses or tenderness   Skin: normal coloration and turgor, no rashes    Neurologic: oriented, normal mood   Extremities: normal strength, tone, and muscle mass   HEENT sclera clear, anicteric, oropharynx clear, no lesions, neck supple with midline trachea and thyroid without masses   Mouth/Teeth mucous membranes moist, pharynx normal without lesions and dental hygiene good   Neck supple and no masses   Cardiovascular: regular rate and rhythm   Respiratory:  appears  well, vitals normal, no respiratory distress, acyanotic, normal RR, chest clear, no wheezing, crepitations, rhonchi, normal symmetric air entry   Abdomen: soft, non-tender; bowel sounds normal; no masses,  no organomegaly   Urinary: urethral meatus normal      Assessment:    Pregnancy: G1P0 Patient Active Problem List   Diagnosis Date Noted  . Supervision of high risk pregnancy, antepartum 09/25/2015  . BP (high blood pressure) 02/18/2015  . HLD (hyperlipidemia) 03/30/2014  . Type 1 diabetes mellitus (HCC) 03/14/2014  . Hypertrophy of nasal turbinates 05/16/2013        Plan:     Initial labs drawn. Prenatal vitamins. Problem list reviewed and updated. Genetic Screening discussed First Screen: requested.  Ultrasound discussed; fetal survey: requested.  Follow up in 4 weeks.  Type 1 DM:  Pt has pump (closed loop with continuous glucose monitoring--shut off during day since not FDA approved in pregnancy and set to 120).  Endocrinologist managing.  She is seeing endocrinologist tomorrow. Baseline labs--cbc, cmp, hgb A1C, 24 hour urine Baby ASA = 81 mg at 12 weeks Fetal Echo  HTN:  Controlled not on meds; does not do well on Labetalol, does ok on Aldomet,  24 hour urine  First screen ordered, AFP at 16 weeks.  Anatomy US at 19-20 weeks with MFM  RTC 4 weeks   Valerie Norlander H. 09/25/2015

## 2015-10-01 LAB — CYSTIC FIBROSIS DIAGNOSTIC STUDY

## 2015-10-07 DIAGNOSIS — Z3490 Encounter for supervision of normal pregnancy, unspecified, unspecified trimester: Secondary | ICD-10-CM | POA: Diagnosis not present

## 2015-10-07 MED FILL — CONTOUR NEXT STRIPS: 38 days supply | Qty: 300 | Fill #6

## 2015-10-07 NOTE — Addendum Note (Signed)
Addended by: Granville LewisLARK, Jaimie Redditt L on: 10/07/2015 08:36 AM   Modules accepted: Orders

## 2015-10-08 LAB — CBC
HCT: 37.2 % (ref 35.0–45.0)
Hemoglobin: 12.6 g/dL (ref 11.7–15.5)
MCH: 30.4 pg (ref 27.0–33.0)
MCHC: 33.9 g/dL (ref 32.0–36.0)
MCV: 89.6 fL (ref 80.0–100.0)
MPV: 10.5 fL (ref 7.5–12.5)
Platelets: 256 10*3/uL (ref 140–400)
RBC: 4.15 MIL/uL (ref 3.80–5.10)
RDW: 13.6 % (ref 11.0–15.0)
WBC: 9 10*3/uL (ref 3.8–10.8)

## 2015-10-08 LAB — COMPREHENSIVE METABOLIC PANEL
ALT: 12 U/L (ref 6–29)
AST: 13 U/L (ref 10–30)
Albumin: 3.9 g/dL (ref 3.6–5.1)
Alkaline Phosphatase: 54 U/L (ref 33–115)
BUN: 12 mg/dL (ref 7–25)
CO2: 25 mmol/L (ref 20–31)
Calcium: 9.5 mg/dL (ref 8.6–10.2)
Chloride: 103 mmol/L (ref 98–110)
Creat: 0.6 mg/dL (ref 0.50–1.10)
Glucose, Bld: 95 mg/dL (ref 65–99)
Potassium: 4.2 mmol/L (ref 3.5–5.3)
Sodium: 136 mmol/L (ref 135–146)
Total Bilirubin: 0.4 mg/dL (ref 0.2–1.2)
Total Protein: 6.7 g/dL (ref 6.1–8.1)

## 2015-10-08 LAB — OTHER SOLSTAS TEST

## 2015-10-11 LAB — CREATININE CLEARANCE, URINE, 24 HOUR
Body Surface Area: 1.89
Creatinine Clearance: 207 mL/min — ABNORMAL HIGH (ref 75–115)
Creatinine, 24H Ur: 1.95 g/(24.h) (ref 0.63–2.50)
Creatinine, Urine: 65 mg/dL (ref 20–320)
Height Feet: 5 ft
Height Inches: 4 [in_us]
Weight Pounds: 184 [lb_av]

## 2015-10-11 LAB — PROTEIN, URINE, 24 HOUR
Protein, 24H Urine: 150 mg/24 h — ABNORMAL HIGH (ref <150)
Protein, Urine: 5 mg/dL (ref 5–24)

## 2015-10-15 ENCOUNTER — Encounter (HOSPITAL_COMMUNITY): Payer: Self-pay | Admitting: Obstetrics & Gynecology

## 2015-10-17 ENCOUNTER — Ambulatory Visit (INDEPENDENT_AMBULATORY_CARE_PROVIDER_SITE_OTHER): Payer: 59 | Admitting: Obstetrics & Gynecology

## 2015-10-17 DIAGNOSIS — O24011 Pre-existing diabetes mellitus, type 1, in pregnancy, first trimester: Secondary | ICD-10-CM

## 2015-10-18 DIAGNOSIS — Z36 Encounter for antenatal screening of mother: Secondary | ICD-10-CM | POA: Diagnosis not present

## 2015-10-18 DIAGNOSIS — O24919 Unspecified diabetes mellitus in pregnancy, unspecified trimester: Secondary | ICD-10-CM | POA: Insufficient documentation

## 2015-10-18 DIAGNOSIS — O09891 Supervision of other high risk pregnancies, first trimester: Secondary | ICD-10-CM | POA: Diagnosis not present

## 2015-10-19 NOTE — Progress Notes (Signed)
   PRENATAL VISIT NOTE  Subjective:  Valerie Dennis is a 30 y.o. G1P0 at 7146w1d being seen today for ongoing prenatal care.  She is currently monitored for the following issues for this high-risk pregnancy and has HLD (hyperlipidemia); BP (high blood pressure); Hypertrophy of nasal turbinates; Type 1 diabetes mellitus (HCC); Supervision of high risk pregnancy, antepartum; and Diabetes mellitus in pregnancy [facility](HCC) on her problem list.  Patient reports delayed gastric emptying which leads to low CBGs.  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  Overall feeling less fatigues and less nausea..  Contractions: Not present. Vag. Bleeding: None.  Movement: Absent. Denies leaking of fluid.   The following portions of the patient's history were reviewed and updated as appropriate: allergies, current medications, past family history, past medical history, past social history, past surgical history and problem list. Problem list updated.  Objective:   Vitals:   10/17/15 1605  BP: 113/74  Pulse: 86  Weight: 191 lb (86.6 kg)    Fetal Status: Fetal Heart Rate (bpm): 162   Movement: Absent     General:  Alert, oriented and cooperative. Patient is in no acute distress.  Skin: Skin is warm and dry. No rash noted.   Cardiovascular: Normal heart rate noted  Respiratory: Normal respiratory effort, no problems with respiration noted  Abdomen: Soft, gravid, appropriate for gestational age. Pain/Pressure: Absent     Pelvic:  Cervical exam deferred        Extremities: Normal range of motion.  Edema: None  Mental Status: Normal mood and affect. Normal behavior. Normal judgment and thought content.   Urinalysis: Urine Protein: Negative Urine Glucose: Negative  Assessment and Plan:  Pregnancy: G1P0 at 6746w1d  1. Pre-existing type 1 diabetes mellitus during pregnancy in first trimester -increase to 6 small meals a day instead of two medium and one large meal  See if  this helps her motility and CBGs.  Can consider adding reglan. -pt ot get second opinion about using the closed loop pump for her DM.  It balances her to 120 when on.  It is not approved for pregnancy.  When she is off the closed loop, she is harder to control. -fetal echo -optho  2. Hypertension--well controlled, not on meds  3- Pt desires NIPS and AFP   Preterm labor symptoms and general obstetric precautions including but not limited to vaginal bleeding, contractions, leaking of fluid and fetal movement were reviewed in detail with the patient. Please refer to After Visit Summary for other counseling recommendations.    RTC 3 weeks Lesly DukesKelly H Leggett, MD

## 2015-10-24 ENCOUNTER — Ambulatory Visit (HOSPITAL_COMMUNITY)
Admission: RE | Admit: 2015-10-24 | Discharge: 2015-10-24 | Disposition: A | Discharge status: Home / Self Care | Payer: 59 | Source: Ambulatory Visit | Attending: Obstetrics & Gynecology | Admitting: Obstetrics & Gynecology

## 2015-10-24 ENCOUNTER — Ambulatory Visit (HOSPITAL_COMMUNITY): Payer: 59

## 2015-10-24 ENCOUNTER — Encounter (HOSPITAL_COMMUNITY): Payer: Self-pay

## 2015-10-24 ENCOUNTER — Other Ambulatory Visit: Payer: Self-pay | Admitting: Obstetrics & Gynecology

## 2015-10-24 ENCOUNTER — Ambulatory Visit: Payer: 59 | Admitting: *Deleted

## 2015-10-24 DIAGNOSIS — O24311 Unspecified pre-existing diabetes mellitus in pregnancy, first trimester: Secondary | ICD-10-CM

## 2015-10-24 DIAGNOSIS — Z369 Encounter for antenatal screening, unspecified: Secondary | ICD-10-CM

## 2015-10-24 DIAGNOSIS — Z3A13 13 weeks gestation of pregnancy: Secondary | ICD-10-CM | POA: Diagnosis not present

## 2015-10-24 DIAGNOSIS — Z36 Encounter for antenatal screening of mother: Secondary | ICD-10-CM | POA: Diagnosis not present

## 2015-10-24 DIAGNOSIS — O34211 Maternal care for low transverse scar from previous cesarean delivery: Secondary | ICD-10-CM | POA: Diagnosis not present

## 2015-10-24 DIAGNOSIS — Z3A12 12 weeks gestation of pregnancy: Secondary | ICD-10-CM | POA: Diagnosis not present

## 2015-10-24 DIAGNOSIS — Z3A00 Weeks of gestation of pregnancy not specified: Secondary | ICD-10-CM | POA: Diagnosis not present

## 2015-10-24 DIAGNOSIS — Z3490 Encounter for supervision of normal pregnancy, unspecified, unspecified trimester: Secondary | ICD-10-CM

## 2015-10-28 ENCOUNTER — Other Ambulatory Visit: Payer: Self-pay

## 2015-10-28 ENCOUNTER — Encounter: Payer: Self-pay | Admitting: *Deleted

## 2015-10-28 VITALS — BP 112/76 | HR 78 | Resp 14 | Ht 64.0 in | Wt 194.0 lb

## 2015-10-28 DIAGNOSIS — E109 Type 1 diabetes mellitus without complications: Secondary | ICD-10-CM

## 2015-10-28 NOTE — Patient Outreach (Signed)
[facility]) Care Management   10/28/2015  Valerie Dennis [birth year] [id number]  Valerie Dennis is an 30 y.o. female.   Member seen for follow up office visit for Link to Wellness program for self management of Type 1 diabetes  Subjective: Member stets that she is now [redacted] weeks pregnant.  States she has had some nausea and is feeling tired.  States that she has the new 670 G pump but she is not able to put it in automatic mode as she is pregnant.  States that she is to send her Carelink download to the diabetic educator at Dr. Larena Glassman office twice a week and they make adjustments to her insulin as needed.  States she is trying to keep her sugars 60-90 before meals but it has been difficult at times.  States she has not been exercising much since she became pregnant.  Objective:   Review of Systems  Gastrointestinal: Positive for heartburn and nausea.  All other systems reviewed and are negative.   Physical Exam Today's Vitals   10/28/15 1559 10/28/15 1610  BP: 112/76   Pulse: 78   Resp: 14   SpO2: 98%   Weight: 194 lb (88 kg)   Height: 1.626 m (5\' 4" )   PainSc: 0-No pain 0-No pain   Encounter Medications:   Outpatient Encounter Prescriptions as of 10/28/2015  Medication Sig Note  . glucagon (GLUCAGON EMERGENCY) 1 MG injection Inject 1 mg into the vein once as needed.   . insulin aspart (NOVOLOG) 100 UNIT/ML injection Inject 1 Units into the skin continuous. Insulin pump   . insulin glargine (LANTUS) 100 UNIT/ML injection Inject 50 Units into the skin at bedtime. Reported on 04/23/2015 09/11/2015: Now on insulin pump with Novolog in pump  . NOVOLOG FLEXPEN 100 UNIT/ML FlexPen  09/23/2015: Received from: External Pharmacy  . Prenatal Vit-Fe Fumarate-FA (MULTIVITAMIN-PRENATAL) 27-0.8 MG TABS tablet Take 1 tablet by mouth daily at 12 noon.   Marland Kitchen ipratropium (ATROVENT) 0.03 % nasal spray Place 2 sprays into both nostrils 2 (two) times daily as needed for rhinitis.   .  methyldopa (ALDOMET) 250 MG tablet Take 250 mg by mouth 2 (two) times daily.   . mometasone (NASONEX) 50 MCG/ACT nasal spray Place 2 sprays into the nose daily.    No facility-administered encounter medications on file as of 10/28/2015.     Functional Status:   In your present state of health, do you have any difficulty performing the following activities: 10/28/2015 06/24/2015  Hearing? N N  Vision? N N  Difficulty concentrating or making decisions? N N  Walking or climbing stairs? N N  Dressing or bathing? N N  Doing errands, shopping? N N    Fall/Depression Screening:    PHQ 2/9 Scores 10/28/2015 06/24/2015 04/23/2015 03/14/2015 02/18/2015  PHQ - 2 Score 0 0 0 0 0    Assessment:  Member seen for follow up office visit for Link to Wellness program for self management of Type 1 diabetes. She is not meeting  Hemoglobin A1C goal of below 7% with  hemoglobin A1C of 7.2%. Member is now pregnant and is being followed closely by her OB/GYN and endocrinologist to control her blood sugars  Member now has the  the Medtronic MiniMed 670G with Enlite sensor but is using in manual mode. Member is watching her CHO intake and giving her bolus accordingly. Member has not been exercising since becoming pregnant. Reports some indigestion and nausea at times.  Plan:  Plan to eat 3  meals and 2 small snacks Plan to walk 2 times a walk 15-30 minutes a week Plan to send Carelink upload 2 times a week to endocrinologist office Plan to return to Link to Wellness on 02/06/16 at 4PM  [facility] Problem One   Flowsheet Row Most Recent Value  Care Plan Problem One  Elevated blood sugars related to dx of Type 1 DM  Role Documenting the Problem One  Care Management Coordinator  Care Plan for Problem One  Active  [facility] Long Term Goal (31-90 days)  Member will decrease hemoglobin A1C to 7 or below in the next 90 days  [facility] Long Term Goal Start Date  10/28/15 [Continue hemoglobin A1C down to 7.2%]  Interventions for  Problem One Long Term Goal  Instructed on importance of keeping her blood sugars tighly controlled while pregnant, Encouraged to eat small frequent meals if having nausea, Instructed to continue to [facility],  Reinforced CHO counting and portion control and to try to give insulin bolus based on her CHO intake and correction factor, Encouraged to eat snacks with protein and 15 gm of CHO, Reinforced actions to take for hypoglycemia, Reinforced on importance of regular exercise for glycemic control    Dudley MajorMelissa Francyne Arreaga RN, [facility] CenterBS[facility],CCM Care Management Coordinator-Link to Wellness [facility] [phone]

## 2015-10-29 ENCOUNTER — Encounter: Payer: Self-pay | Admitting: *Deleted

## 2015-10-29 NOTE — Patient Instructions (Signed)
1. Plan to eat 3 meals and 2 small snacks 2. Plan to walk 2 times a walk 15-30 minutes a week 3. Plan to send Carelink upload 2 times a week to endocrinologist office 4. Plan to return to Link to Wellness on 02/06/16 at [facility]4PM

## 2015-10-31 ENCOUNTER — Other Ambulatory Visit (HOSPITAL_COMMUNITY): Payer: Self-pay

## 2015-11-04 ENCOUNTER — Encounter: Payer: Self-pay | Admitting: Obstetrics & Gynecology

## 2015-11-04 DIAGNOSIS — E103291 Type 1 diabetes mellitus with mild nonproliferative diabetic retinopathy without macular edema, right eye: Secondary | ICD-10-CM | POA: Diagnosis not present

## 2015-11-04 DIAGNOSIS — H30041 Focal chorioretinal inflammation, macular or paramacular, right eye: Secondary | ICD-10-CM | POA: Diagnosis not present

## 2015-11-04 DIAGNOSIS — Z83511 Family history of glaucoma: Secondary | ICD-10-CM | POA: Diagnosis not present

## 2015-11-04 DIAGNOSIS — Z3400 Encounter for supervision of normal first pregnancy, unspecified trimester: Secondary | ICD-10-CM | POA: Diagnosis not present

## 2015-11-04 DIAGNOSIS — E103392 Type 1 diabetes mellitus with moderate nonproliferative diabetic retinopathy without macular edema, left eye: Secondary | ICD-10-CM | POA: Diagnosis not present

## 2015-11-04 DIAGNOSIS — H40013 Open angle with borderline findings, low risk, bilateral: Secondary | ICD-10-CM | POA: Diagnosis not present

## 2015-11-04 DIAGNOSIS — Z973 Presence of spectacles and contact lenses: Secondary | ICD-10-CM | POA: Diagnosis not present

## 2015-11-04 DIAGNOSIS — H52223 Regular astigmatism, bilateral: Secondary | ICD-10-CM | POA: Diagnosis not present

## 2015-11-04 DIAGNOSIS — H5213 Myopia, bilateral: Secondary | ICD-10-CM | POA: Diagnosis not present

## 2015-11-05 ENCOUNTER — Telehealth: Payer: Self-pay | Admitting: *Deleted

## 2015-11-05 DIAGNOSIS — K5900 Constipation, unspecified: Secondary | ICD-10-CM

## 2015-11-05 MED ORDER — DOCUSATE SODIUM 50 MG PO CAPS: 50.0000 mg | ORAL_CAPSULE | Freq: Two times a day (BID) | ORAL | 3 refills | Status: DC

## 2015-11-05 MED FILL — ACCU-CHEK MULTICLIX LANCETS: 39 days supply | Qty: 306 | Fill #3

## 2015-11-05 MED FILL — NOVOLOG FLEXPEN SYRINGE: 100 | 30 days supply | Qty: 30 | Fill #6

## 2015-11-05 MED FILL — DOK 100 MG SOFTGEL: 100 | 50 days supply | Qty: 100 | Fill #0

## 2015-11-05 NOTE — Telephone Encounter (Signed)
Pt sent a [facility] through [facility] request [facility] RX for Colace.  This is an OTC RX but pt insistant that if she got a RX her insurance would pay for it.

## 2015-11-07 ENCOUNTER — Ambulatory Visit (INDEPENDENT_AMBULATORY_CARE_PROVIDER_SITE_OTHER): Payer: 59 | Admitting: Obstetrics & Gynecology

## 2015-11-07 VITALS — BP 109/67 | HR 76 | Wt 195.0 lb

## 2015-11-07 DIAGNOSIS — O0992 Supervision of high risk pregnancy, unspecified, second trimester: Secondary | ICD-10-CM

## 2015-11-07 DIAGNOSIS — I10 Essential (primary) hypertension: Secondary | ICD-10-CM

## 2015-11-07 DIAGNOSIS — O24012 Pre-existing diabetes mellitus, type 1, in pregnancy, second trimester: Secondary | ICD-10-CM

## 2015-11-07 DIAGNOSIS — O10912 Unspecified pre-existing hypertension complicating pregnancy, second trimester: Secondary | ICD-10-CM

## 2015-11-07 DIAGNOSIS — E109 Type 1 diabetes mellitus without complications: Secondary | ICD-10-CM

## 2015-11-07 NOTE — Patient Instructions (Signed)
Sciatica With Rehab The sciatic nerve runs from the back down the leg and is responsible for sensation and control of the muscles in the back (posterior) side of the thigh, lower leg, and foot. Sciatica is a condition that is characterized by inflammation of this nerve.  SYMPTOMS   Signs of nerve damage, including numbness and/or weakness along the posterior side of the lower extremity.  Pain in the back of the thigh that may also travel down the leg.  Pain that worsens when sitting for long periods of time.  Occasionally, pain in the back or buttock. CAUSES  Inflammation of the sciatic nerve is the cause of sciatica. The inflammation is due to something irritating the nerve. Common sources of irritation include:  Sitting for long periods of time.  Direct trauma to the nerve.  Arthritis of the spine.  Herniated or ruptured disk.  Slipping of the vertebrae (spondylolisthesis).  Pressure from soft tissues, such as muscles or ligament-like tissue (fascia). RISK INCREASES WITH:  Sports that place pressure or stress on the spine (football or weightlifting).  Poor strength and flexibility.  Failure to warm up properly before activity.  Family history of low back pain or disk disorders.  Previous back injury or surgery.  Poor body mechanics, especially when lifting, or poor posture. PREVENTION   Warm up and stretch properly before activity.  Maintain physical fitness:  Strength, flexibility, and endurance.  Cardiovascular fitness.  Learn and use proper technique, especially with posture and lifting. When possible, have coach correct improper technique.  Avoid activities that place stress on the spine. PROGNOSIS If treated properly, then sciatica usually resolves within 6 weeks. However, occasionally surgery is necessary.  RELATED COMPLICATIONS   Permanent nerve damage, including pain, numbness, tingle, or weakness.  Chronic back pain.  Risks of surgery: infection,  bleeding, nerve damage, or damage to surrounding tissues. TREATMENT Treatment initially involves resting from any activities that aggravate your symptoms. The use of ice and medication may help reduce pain and inflammation. The use of strengthening and stretching exercises may help reduce pain with activity. These exercises may be performed at home or with referral to a therapist. A therapist may recommend further treatments, such as transcutaneous electronic nerve stimulation (TENS) or ultrasound. Your caregiver may recommend corticosteroid injections to help reduce inflammation of the sciatic nerve. If symptoms persist despite non-surgical (conservative) treatment, then surgery may be recommended. MEDICATION  If pain medication is necessary, then nonsteroidal anti-inflammatory medications, such as aspirin and ibuprofen, or other minor pain relievers, such as acetaminophen, are often recommended.  Do not take pain medication for 7 days before surgery.  Prescription pain relievers may be given if deemed necessary by your caregiver. Use only as directed and only as much as you need.  Ointments applied to the skin may be helpful.  Corticosteroid injections may be given by your caregiver. These injections should be reserved for the most serious cases, because they may only be given a certain number of times. HEAT AND COLD  Cold treatment (icing) relieves pain and reduces inflammation. Cold treatment should be applied for 10 to 15 minutes every 2 to 3 hours for inflammation and pain and immediately after any activity that aggravates your symptoms. Use ice packs or massage the area with a piece of ice (ice massage).  Heat treatment may be used prior to performing the stretching and strengthening activities prescribed by your caregiver, physical therapist, or athletic trainer. Use a heat pack or soak the injury in warm water.   SEEK MEDICAL CARE IF:  Treatment seems to offer no benefit, or the condition  worsens.  Any medications produce adverse side effects. EXERCISES  RANGE OF MOTION (ROM) AND STRETCHING EXERCISES - Sciatica Most people with sciatic will find that their symptoms worsen with either excessive bending forward (flexion) or arching at the low back (extension). The exercises which will help resolve your symptoms will focus on the opposite motion. Your physician, physical therapist or athletic trainer will help you determine which exercises will be most helpful to resolve your low back pain. Do not complete any exercises without first consulting with your clinician. Discontinue any exercises which worsen your symptoms until you speak to your clinician. If you have pain, numbness or tingling which travels down into your buttocks, leg or foot, the goal of the therapy is for these symptoms to move closer to your back and eventually resolve. Occasionally, these leg symptoms will get better, but your low back pain may worsen; this is typically an indication of progress in your rehabilitation. Be certain to be very alert to any changes in your symptoms and the activities in which you participated in the 24 hours prior to the change. Sharing this information with your clinician will allow him/her to most efficiently treat your condition. These exercises may help you when beginning to rehabilitate your injury. Your symptoms may resolve with or without further involvement from your physician, physical therapist or athletic trainer. While completing these exercises, remember:   Restoring tissue flexibility helps normal motion to return to the joints. This allows healthier, less painful movement and activity.  An effective stretch should be held for at least 30 seconds.  A stretch should never be painful. You should only feel a gentle lengthening or release in the stretched tissue. FLEXION RANGE OF MOTION AND STRETCHING EXERCISES: STRETCH - Flexion, Single Knee to Chest   Lie on a firm bed or floor  with both legs extended in front of you.  Keeping one leg in contact with the floor, bring your opposite knee to your chest. Hold your leg in place by either grabbing behind your thigh or at your knee.  Pull until you feel a gentle stretch in your low back. Hold __________ seconds.  Slowly release your grasp and repeat the exercise with the opposite side. Repeat __________ times. Complete this exercise __________ times per day.  STRETCH - Flexion, Double Knee to Chest  Lie on a firm bed or floor with both legs extended in front of you.  Keeping one leg in contact with the floor, bring your opposite knee to your chest.  Tense your stomach muscles to support your back and then lift your other knee to your chest. Hold your legs in place by either grabbing behind your thighs or at your knees.  Pull both knees toward your chest until you feel a gentle stretch in your low back. Hold __________ seconds.  Tense your stomach muscles and slowly return one leg at a time to the floor. Repeat __________ times. Complete this exercise __________ times per day.  STRETCH - Low Trunk Rotation   Lie on a firm bed or floor. Keeping your legs in front of you, bend your knees so they are both pointed toward the ceiling and your feet are flat on the floor.  Extend your arms out to the side. This will stabilize your upper body by keeping your shoulders in contact with the floor.  Gently and slowly drop both knees together to one side until   you feel a gentle stretch in your low back. Hold for __________ seconds.  Tense your stomach muscles to support your low back as you bring your knees back to the starting position. Repeat the exercise to the other side. Repeat __________ times. Complete this exercise __________ times per day  EXTENSION RANGE OF MOTION AND FLEXIBILITY EXERCISES: STRETCH - Extension, Prone on Elbows  Lie on your stomach on the floor, a bed will be too soft. Place your palms about shoulder  width apart and at the height of your head.  Place your elbows under your shoulders. If this is too painful, stack pillows under your chest.  Allow your body to relax so that your hips drop lower and make contact more completely with the floor.  Hold this position for __________ seconds.  Slowly return to lying flat on the floor. Repeat __________ times. Complete this exercise __________ times per day.  RANGE OF MOTION - Extension, Prone Press Ups  Lie on your stomach on the floor, a bed will be too soft. Place your palms about shoulder width apart and at the height of your head.  Keeping your back as relaxed as possible, slowly straighten your elbows while keeping your hips on the floor. You may adjust the placement of your hands to maximize your comfort. As you gain motion, your hands will come more underneath your shoulders.  Hold this position __________ seconds.  Slowly return to lying flat on the floor. Repeat __________ times. Complete this exercise __________ times per day.  STRENGTHENING EXERCISES - Sciatica  These exercises may help you when beginning to rehabilitate your injury. These exercises should be done near your "sweet spot." This is the neutral, low-back arch, somewhere between fully rounded and fully arched, that is your least painful position. When performed in this safe range of motion, these exercises can be used for people who have either a flexion or extension based injury. These exercises may resolve your symptoms with or without further involvement from your physician, physical therapist or athletic trainer. While completing these exercises, remember:   Muscles can gain both the endurance and the strength needed for everyday activities through controlled exercises.  Complete these exercises as instructed by your physician, physical therapist or athletic trainer. Progress with the resistance and repetition exercises only as your caregiver advises.  You may  experience muscle soreness or fatigue, but the pain or discomfort you are trying to eliminate should never worsen during these exercises. If this pain does worsen, stop and make certain you are following the directions exactly. If the pain is still present after adjustments, discontinue the exercise until you can discuss the trouble with your clinician. STRENGTHENING - Deep Abdominals, Pelvic Tilt   Lie on a firm bed or floor. Keeping your legs in front of you, bend your knees so they are both pointed toward the ceiling and your feet are flat on the floor.  Tense your lower abdominal muscles to press your low back into the floor. This motion will rotate your pelvis so that your tail bone is scooping upwards rather than pointing at your feet or into the floor.  With a gentle tension and even breathing, hold this position for __________ seconds. Repeat __________ times. Complete this exercise __________ times per day.  STRENGTHENING - Abdominals, Crunches   Lie on a firm bed or floor. Keeping your legs in front of you, bend your knees so they are both pointed toward the ceiling and your feet are flat on the   floor. Cross your arms over your chest.  Slightly tip your chin down without bending your neck.  Tense your abdominals and slowly lift your trunk high enough to just clear your shoulder blades. Lifting higher can put excessive stress on the low back and does not further strengthen your abdominal muscles.  Control your return to the starting position. Repeat __________ times. Complete this exercise __________ times per day.  STRENGTHENING - Quadruped, Opposite UE/LE Lift  Assume a hands and knees position on a firm surface. Keep your hands under your shoulders and your knees under your hips. You may place padding under your knees for comfort.  Find your neutral spine and gently tense your abdominal muscles so that you can maintain this position. Your shoulders and hips should form a rectangle  that is parallel with the floor and is not twisted.  Keeping your trunk steady, lift your right hand no higher than your shoulder and then your left leg no higher than your hip. Make sure you are not holding your breath. Hold this position __________ seconds.  Continuing to keep your abdominal muscles tense and your back steady, slowly return to your starting position. Repeat with the opposite arm and leg. Repeat __________ times. Complete this exercise __________ times per day.  STRENGTHENING - Abdominals and Quadriceps, Straight Leg Raise   Lie on a firm bed or floor with both legs extended in front of you.  Keeping one leg in contact with the floor, bend the other knee so that your foot can rest flat on the floor.  Find your neutral spine, and tense your abdominal muscles to maintain your spinal position throughout the exercise.  Slowly lift your straight leg off the floor about 6 inches for a count of 15, making sure to not hold your breath.  Still keeping your neutral spine, slowly lower your leg all the way to the floor. Repeat this exercise with each leg __________ times. Complete this exercise __________ times per day. POSTURE AND BODY MECHANICS CONSIDERATIONS - Sciatica Keeping correct posture when sitting, standing or completing your activities will reduce the stress put on different body tissues, allowing injured tissues a chance to heal and limiting painful experiences. The following are general guidelines for improved posture. Your physician or physical therapist will provide you with any instructions specific to your needs. While reading these guidelines, remember:  The exercises prescribed by your provider will help you have the flexibility and strength to maintain correct postures.  The correct posture provides the optimal environment for your joints to work. All of your joints have less wear and tear when properly supported by a spine with good posture. This means you will  experience a healthier, less painful body.  Correct posture must be practiced with all of your activities, especially prolonged sitting and standing. Correct posture is as important when doing repetitive low-stress activities (typing) as it is when doing a single heavy-load activity (lifting). RESTING POSITIONS Consider which positions are most painful for you when choosing a resting position. If you have pain with flexion-based activities (sitting, bending, stooping, squatting), choose a position that allows you to rest in a less flexed posture. You would want to avoid curling into a fetal position on your side. If your pain worsens with extension-based activities (prolonged standing, working overhead), avoid resting in an extended position such as sleeping on your stomach. Most people will find more comfort when they rest with their spine in a more neutral position, neither too rounded nor too   arched. Lying on a non-sagging bed on your side with a pillow between your knees, or on your back with a pillow under your knees will often provide some relief. Keep in mind, being in any one position for a prolonged period of time, no matter how correct your posture, can still lead to stiffness. PROPER SITTING POSTURE In order to minimize stress and discomfort on your spine, you must sit with correct posture Sitting with good posture should be effortless for a healthy body. Returning to good posture is a gradual process. Many people can work toward this most comfortably by using various supports until they have the flexibility and strength to maintain this posture on their own. When sitting with proper posture, your ears will fall over your shoulders and your shoulders will fall over your hips. You should use the back of the chair to support your upper back. Your low back will be in a neutral position, just slightly arched. You may place a small pillow or folded towel at the base of your low back for support.  When  working at a desk, create an environment that supports good, upright posture. Without extra support, muscles fatigue and lead to excessive strain on joints and other tissues. Keep these recommendations in mind: CHAIR:   A chair should be able to slide under your desk when your back makes contact with the back of the chair. This allows you to work closely.  The chair's height should allow your eyes to be level with the upper part of your monitor and your hands to be slightly lower than your elbows. BODY POSITION  Your feet should make contact with the floor. If this is not possible, use a foot rest.  Keep your ears over your shoulders. This will reduce stress on your neck and low back. INCORRECT SITTING POSTURES   If you are feeling tired and unable to assume a healthy sitting posture, do not slouch or slump. This puts excessive strain on your back tissues, causing more damage and pain. Healthier options include:  Using more support, like a lumbar pillow.  Switching tasks to something that requires you to be upright or walking.  Talking a brief walk.  Lying down to rest in a neutral-spine position. PROLONGED STANDING WHILE SLIGHTLY LEANING FORWARD  When completing a task that requires you to lean forward while standing in one place for a long time, place either foot up on a stationary 2-4 inch high object to help maintain the best posture. When both feet are on the ground, the low back tends to lose its slight inward curve. If this curve flattens (or becomes too large), then the back and your other joints will experience too much stress, fatigue more quickly and can cause pain.  CORRECT STANDING POSTURES Proper standing posture should be assumed with all daily activities, even if they only take a few moments, like when brushing your teeth. As in sitting, your ears should fall over your shoulders and your shoulders should fall over your hips. You should keep a slight tension in your abdominal  muscles to brace your spine. Your tailbone should point down to the ground, not behind your body, resulting in an over-extended swayback posture.  INCORRECT STANDING POSTURES  Common incorrect standing postures include a forward head, locked knees and/or an excessive swayback. WALKING Walk with an upright posture. Your ears, shoulders and hips should all line-up. PROLONGED ACTIVITY IN A FLEXED POSITION When completing a task that requires you to bend forward   at your waist or lean over a low surface, try to find a way to stabilize 3 of 4 of your limbs. You can place a hand or elbow on your thigh or rest a knee on the surface you are reaching across. This will provide you more stability so that your muscles do not fatigue as quickly. By keeping your knees relaxed, or slightly bent, you will also reduce stress across your low back. CORRECT LIFTING TECHNIQUES DO :   Assume a wide stance. This will provide you more stability and the opportunity to get as close as possible to the object which you are lifting.  Tense your abdominals to brace your spine; then bend at the knees and hips. Keeping your back locked in a neutral-spine position, lift using your leg muscles. Lift with your legs, keeping your back straight.  Test the weight of unknown objects before attempting to lift them.  Try to keep your elbows locked down at your sides in order get the best strength from your shoulders when carrying an object.  Always ask for help when lifting heavy or awkward objects. INCORRECT LIFTING TECHNIQUES DO NOT:   Lock your knees when lifting, even if it is a small object.  Bend and twist. Pivot at your feet or move your feet when needing to change directions.  Assume that you cannot safely pick up a paperclip without proper posture.   This information is not intended to replace advice given to you by your health care provider. Make sure you discuss any questions you have with your health care provider.     Document Released: 01/26/2005 Document Revised: 06/12/2014 Document Reviewed: 05/10/2008 Else[employer] Interactive Patient Education 2016 [employer]  

## 2015-11-07 NOTE — Progress Notes (Signed)
Lt sciatica pain

## 2015-11-07 NOTE — Progress Notes (Signed)
   PRENATAL VISIT NOTE  Subjective:  Valerie Dennis is a 30 y.o. G1P0 at 8357w6d being seen today for ongoing prenatal care.  She is currently monitored for the following issues for this high-risk pregnancy and has HLD (hyperlipidemia); BP (high blood pressure); Hypertrophy of nasal turbinates; Type 1 diabetes mellitus (HCC); Supervision of high risk pregnancy, antepartum; and Diabetes mellitus in pregnancy [facility](HCC) on her problem list.  Patient reports left upper buttocks pain..  Contractions: Not present. Vag. Bleeding: None.  Movement: Absent. Denies leaking of fluid.   The following portions of the patient's history were reviewed and updated as appropriate: allergies, current medications, past family history, past medical history, past social history, past surgical history and problem list. Problem list updated.  Objective:   Vitals:   11/07/15 1321  BP: 109/67  Pulse: 76  Weight: 195 lb (88.5 kg)    Fetal Status: Fetal Heart Rate (bpm): 143   Movement: Absent     General:  Alert, oriented and cooperative. Patient is in no acute distress.  Skin: Skin is warm and dry. No rash noted.   Cardiovascular: Normal heart rate noted  Respiratory: Normal respiratory effort, no problems with respiration noted  Abdomen: Soft, gravid, appropriate for gestational age. Pain/Pressure: Absent     Pelvic:  Cervical exam deferred        Extremities: Normal range of motion.  Edema: None  Mental Status: Normal mood and affect. Normal behavior. Normal judgment and thought content.   Urinalysis: Urine Protein: Negative Urine Glucose: Negative  Assessment and Plan:  Pregnancy: G1P0 at 7957w6d  1. Supervision of high risk pregnancy, antepartum, second trimester  2. Type 1 diabetes mellitus without complication (HCC) GLC fasting all <90 Pt has glucose sensor and all of the levels are <160.   She is sending her labs to her endocrinologists lab biweekly.  She was off 'auto' on her insulin pump and is doing  better with carb counting.    - US MFM OB DETAIL +14 WK; Future - US Fetal Echocardiography; Future  3. Pre-existing type 1 diabetes mellitus during pregnancy in second trimester See above  - US MFM OB DETAIL +14 WK; Future - US Fetal Echocardiography; Future  4. Essential hypertension BP stable  5. Sciatica- the pain is a bit higher and more midline than expected but, pt given exercises to treat sciatica.  Will assess next visit.   Preterm labor symptoms and general obstetric precautions including but not limited to vaginal bleeding, contractions, leaking of fluid and fetal movement were reviewed in detail with the patient. Please refer to After Visit Summary for other counseling recommendations.  Return in about 4 weeks (around 12/05/2015).  Willodean Rosenthalarolyn Harraway-Smith, MD

## 2015-11-08 ENCOUNTER — Encounter: Payer: Self-pay | Admitting: *Deleted

## 2015-11-11 DIAGNOSIS — E108 Type 1 diabetes mellitus with unspecified complications: Secondary | ICD-10-CM | POA: Diagnosis not present

## 2015-12-04 ENCOUNTER — Ambulatory Visit (HOSPITAL_COMMUNITY)
Admission: RE | Admit: 2015-12-04 | Discharge: 2015-12-04 | Disposition: A | Discharge status: Home / Self Care | Payer: 59 | Source: Ambulatory Visit | Attending: Obstetrics & Gynecology | Admitting: Obstetrics & Gynecology

## 2015-12-04 ENCOUNTER — Encounter (HOSPITAL_COMMUNITY): Payer: Self-pay

## 2015-12-04 DIAGNOSIS — Z3A18 18 weeks gestation of pregnancy: Secondary | ICD-10-CM | POA: Insufficient documentation

## 2015-12-04 DIAGNOSIS — E109 Type 1 diabetes mellitus without complications: Secondary | ICD-10-CM

## 2015-12-04 DIAGNOSIS — O24013 Pre-existing diabetes mellitus, type 1, in pregnancy, third trimester: Secondary | ICD-10-CM | POA: Diagnosis not present

## 2015-12-04 DIAGNOSIS — O24012 Pre-existing diabetes mellitus, type 1, in pregnancy, second trimester: Secondary | ICD-10-CM | POA: Insufficient documentation

## 2015-12-04 DIAGNOSIS — O0990 Supervision of high risk pregnancy, unspecified, unspecified trimester: Secondary | ICD-10-CM

## 2015-12-04 DIAGNOSIS — O10013 Pre-existing essential hypertension complicating pregnancy, third trimester: Secondary | ICD-10-CM | POA: Diagnosis not present

## 2015-12-04 DIAGNOSIS — Z363 Encounter for antenatal screening for malformations: Secondary | ICD-10-CM | POA: Diagnosis not present

## 2015-12-04 DIAGNOSIS — O10012 Pre-existing essential hypertension complicating pregnancy, second trimester: Secondary | ICD-10-CM | POA: Insufficient documentation

## 2015-12-05 ENCOUNTER — Ambulatory Visit (INDEPENDENT_AMBULATORY_CARE_PROVIDER_SITE_OTHER): Payer: 59 | Admitting: Obstetrics & Gynecology

## 2015-12-05 ENCOUNTER — Other Ambulatory Visit (HOSPITAL_COMMUNITY): Payer: Self-pay | Admitting: *Deleted

## 2015-12-05 VITALS — BP 122/80 | HR 78 | Wt 200.0 lb

## 2015-12-05 DIAGNOSIS — Z794 Long term (current) use of insulin: Principal | ICD-10-CM

## 2015-12-05 DIAGNOSIS — O24019 Pre-existing diabetes mellitus, type 1, in pregnancy, unspecified trimester: Secondary | ICD-10-CM

## 2015-12-05 DIAGNOSIS — O10912 Unspecified pre-existing hypertension complicating pregnancy, second trimester: Secondary | ICD-10-CM

## 2015-12-05 DIAGNOSIS — O0990 Supervision of high risk pregnancy, unspecified, unspecified trimester: Secondary | ICD-10-CM

## 2015-12-05 DIAGNOSIS — I10 Essential (primary) hypertension: Secondary | ICD-10-CM

## 2015-12-05 DIAGNOSIS — O0992 Supervision of high risk pregnancy, unspecified, second trimester: Secondary | ICD-10-CM

## 2015-12-05 DIAGNOSIS — IMO0001 Reserved for inherently not codable concepts without codable children: Secondary | ICD-10-CM

## 2015-12-05 DIAGNOSIS — O24319 Unspecified pre-existing diabetes mellitus in pregnancy, unspecified trimester: Principal | ICD-10-CM

## 2015-12-05 DIAGNOSIS — E109 Type 1 diabetes mellitus without complications: Secondary | ICD-10-CM

## 2015-12-06 ENCOUNTER — Ambulatory Visit (HOSPITAL_COMMUNITY): Payer: 59

## 2015-12-06 LAB — ALPHA FETOPROTEIN, MATERNAL
AFP: 32.9 ng/mL
Curr Gest Age: 18.9 weeks
MoM for AFP: 0.99
Open Spina bifida: NEGATIVE
Osb Risk: 1:3860 {titer}

## 2015-12-06 MED FILL — CONTOUR NEXT STRIPS: 38 days supply | Qty: 300 | Fill #7

## 2015-12-06 MED FILL — NOVOLOG FLEXPEN SYRINGE: 100 | 30 days supply | Qty: 30 | Fill #0

## 2015-12-06 NOTE — Progress Notes (Addendum)
PRENATAL VISIT NOTE  Subjective:  Valerie Dennis is a 30 y.o. G1P0 at [redacted]w[redacted]d being seen today for ongoing prenatal care.  She is currently monitored for the following issues for this high-risk pregnancy and has HLD (hyperlipidemia); BP (high blood pressure); Hypertrophy of nasal turbinates; Type 1 diabetes mellitus (HCC); Supervision of high risk pregnancy, antepartum; and Diabetes mellitus in pregnancy on her problem list.  Patient reports elevated glucose despite dietary control and increases in insulin dosage. .  Contractions: Not present. Vag. Bleeding: None.  Movement: Present. Denies leaking of fluid.   The following portions of the patient's history were reviewed and updated as appropriate: allergies, current medications, past family history, past medical history, past social history, past surgical history and problem list. Problem list updated.  Objective:   Vitals:   12/05/15 1539  BP: 122/80  Pulse: 78  Weight: 200 lb (90.7 kg)    Fetal Status: Fetal Heart Rate (bpm): 145   Movement: Present     General:  Alert, oriented and cooperative. Patient is in no acute distress.  Skin: Skin is warm and dry. No rash noted.   Cardiovascular: Normal heart rate noted  Respiratory: Normal respiratory effort, no problems with respiration noted  Abdomen: Soft, gravid, appropriate for gestational age. Pain/Pressure: Absent     Pelvic:  Cervical exam deferred        Extremities: Normal range of motion.  Edema: None  Mental Status: Normal mood and affect. Normal behavior. Normal judgment and thought content.   Assessment and Plan:  Pregnancy: G1P0 at [redacted]w[redacted]d  1. High-risk pregnancy in second trimester - Alpha fetoprotein, maternal  2. Supervision of high risk pregnancy, antepartum Pt has f/u US to complete anatomy in 3 weeks  Pt is already scheduled for a fetal ECHO  3. Type 1 diabetes mellitus without complication (HCC) Pt is reluctant to share her glc log.  She reports that in the  past she has felt 'shamed' if her glc was not where it should be despite her best efforts. She has a good working relationship with her endocrinologist and ants to continue working with him but, is worried that his group (mainly the diabetic educator or nurse) is not as familiar with working with DM in pregnancy and is not being as aggressive as perhaps they should be.  Her fasting glc in the past 3-4 days has been in the 150's and the postprandial has bene around 200.   The pt has a glc pump.  She and her spouse are bother RN's  I have explained to her that we would be happy to work with ehr diabetic nurse to provide guidance in adjusting her insulin.  She will first discuss with her nure that we want tighter control.  If her glc is not regulated soon she will forward via MyChart her glc log for Dr. Penne Lash or myself to review.  Mervyn Gay is aware.    4. Pre-existing type 1 diabetes mellitus during pregnancy, antepartum See above  5. Essential hypertension Stable on Aldomet  Preterm labor symptoms and general obstetric precautions including but not limited to vaginal bleeding, contractions, leaking of fluid and fetal movement were reviewed in detail with the patient. Please refer to After Visit Summary for other counseling recommendations.  Return in about 4 weeks (around 01/02/2016).   Total face-to-face time with patient was 30 min.  Greater than 50% was spent in counseling and coordination of care with the patient. We discussed type I DM in pregnancy and insulin  dosing as above.  Willodean Rosenthalarolyn Harraway-Smith, MD

## 2015-12-07 ENCOUNTER — Encounter: Payer: Self-pay | Admitting: Obstetrics & Gynecology

## 2015-12-09 ENCOUNTER — Encounter: Payer: Self-pay | Admitting: Obstetrics & Gynecology

## 2015-12-12 DIAGNOSIS — E108 Type 1 diabetes mellitus with unspecified complications: Secondary | ICD-10-CM | POA: Diagnosis not present

## 2015-12-17 ENCOUNTER — Encounter: Payer: Self-pay | Admitting: *Deleted

## 2015-12-24 ENCOUNTER — Encounter: Payer: Self-pay | Admitting: *Deleted

## 2015-12-31 DIAGNOSIS — E1065 Type 1 diabetes mellitus with hyperglycemia: Secondary | ICD-10-CM | POA: Diagnosis not present

## 2015-12-31 DIAGNOSIS — O24013 Pre-existing diabetes mellitus, type 1, in pregnancy, third trimester: Secondary | ICD-10-CM | POA: Diagnosis not present

## 2015-12-31 DIAGNOSIS — E7800 Pure hypercholesterolemia, unspecified: Secondary | ICD-10-CM | POA: Diagnosis not present

## 2015-12-31 DIAGNOSIS — F909 Attention-deficit hyperactivity disorder, unspecified type: Secondary | ICD-10-CM | POA: Diagnosis not present

## 2015-12-31 DIAGNOSIS — O10013 Pre-existing essential hypertension complicating pregnancy, third trimester: Secondary | ICD-10-CM | POA: Diagnosis not present

## 2015-12-31 DIAGNOSIS — O99343 Other mental disorders complicating pregnancy, third trimester: Secondary | ICD-10-CM | POA: Diagnosis not present

## 2015-12-31 DIAGNOSIS — Z794 Long term (current) use of insulin: Secondary | ICD-10-CM | POA: Diagnosis not present

## 2015-12-31 DIAGNOSIS — Z9641 Presence of insulin pump (external) (internal): Secondary | ICD-10-CM | POA: Diagnosis not present

## 2015-12-31 DIAGNOSIS — O9989 Other specified diseases and conditions complicating pregnancy, childbirth and the puerperium: Secondary | ICD-10-CM | POA: Diagnosis not present

## 2016-01-01 ENCOUNTER — Encounter: Payer: Self-pay | Admitting: Obstetrics & Gynecology

## 2016-01-06 MED FILL — CONTOUR NEXT STRIPS: 38 days supply | Qty: 300 | Fill #8

## 2016-01-06 MED FILL — NOVOLOG FLEXPEN SYRINGE: 100 | 30 days supply | Qty: 30 | Fill #1

## 2016-01-08 ENCOUNTER — Encounter (HOSPITAL_COMMUNITY): Payer: Self-pay

## 2016-01-08 ENCOUNTER — Ambulatory Visit (HOSPITAL_COMMUNITY)
Admission: RE | Admit: 2016-01-08 | Discharge: 2016-01-08 | Disposition: A | Discharge status: Home / Self Care | Payer: 59 | Source: Ambulatory Visit | Attending: Obstetrics & Gynecology | Admitting: Obstetrics & Gynecology

## 2016-01-08 ENCOUNTER — Other Ambulatory Visit (HOSPITAL_COMMUNITY): Payer: Self-pay | Admitting: Obstetrics and Gynecology

## 2016-01-08 ENCOUNTER — Ambulatory Visit (INDEPENDENT_AMBULATORY_CARE_PROVIDER_SITE_OTHER): Payer: 59 | Admitting: Obstetrics & Gynecology

## 2016-01-08 VITALS — BP 137/94 | HR 93 | Wt 205.0 lb

## 2016-01-08 DIAGNOSIS — O10012 Pre-existing essential hypertension complicating pregnancy, second trimester: Secondary | ICD-10-CM | POA: Diagnosis not present

## 2016-01-08 DIAGNOSIS — O24319 Unspecified pre-existing diabetes mellitus in pregnancy, unspecified trimester: Principal | ICD-10-CM

## 2016-01-08 DIAGNOSIS — O162 Unspecified maternal hypertension, second trimester: Secondary | ICD-10-CM | POA: Insufficient documentation

## 2016-01-08 DIAGNOSIS — Z362 Encounter for other antenatal screening follow-up: Secondary | ICD-10-CM

## 2016-01-08 DIAGNOSIS — Z794 Long term (current) use of insulin: Secondary | ICD-10-CM | POA: Diagnosis not present

## 2016-01-08 DIAGNOSIS — I10 Essential (primary) hypertension: Secondary | ICD-10-CM

## 2016-01-08 DIAGNOSIS — O10912 Unspecified pre-existing hypertension complicating pregnancy, second trimester: Secondary | ICD-10-CM

## 2016-01-08 DIAGNOSIS — Z3A23 23 weeks gestation of pregnancy: Secondary | ICD-10-CM | POA: Insufficient documentation

## 2016-01-08 DIAGNOSIS — O0990 Supervision of high risk pregnancy, unspecified, unspecified trimester: Secondary | ICD-10-CM

## 2016-01-08 DIAGNOSIS — O24419 Gestational diabetes mellitus in pregnancy, unspecified control: Secondary | ICD-10-CM | POA: Diagnosis not present

## 2016-01-08 DIAGNOSIS — IMO0001 Reserved for inherently not codable concepts without codable children: Secondary | ICD-10-CM

## 2016-01-08 DIAGNOSIS — O24019 Pre-existing diabetes mellitus, type 1, in pregnancy, unspecified trimester: Secondary | ICD-10-CM

## 2016-01-08 NOTE — Addendum Note (Signed)
Encounter addended by: Lestine MountSybil Kamin Niblack, RT on: 01/08/2016  4:58 PM<BR>    Actions taken: Imaging Exam ended

## 2016-01-08 NOTE — Addendum Note (Signed)
Addended by: Allie BossierVE, Latorria Zeoli C on: 01/08/2016 10:38 AM   Modules accepted: Orders

## 2016-01-08 NOTE — Progress Notes (Signed)
   PRENATAL VISIT NOTE  Subjective:  Valerie Dennis is a 30 y.o. G1P0 at 6079w5d being seen today for ongoing prenatal care.  She is currently monitored for the following issues for this high-risk pregnancy and has HLD (hyperlipidemia); BP (high blood pressure); Hypertrophy of nasal turbinates; Type 1 diabetes mellitus (HCC); Supervision of high risk pregnancy, antepartum; and Diabetes mellitus in pregnancy on her problem list.  Patient reports no complaints.  Contractions: Not present. Vag. Bleeding: None.  Movement: Present. Denies leaking of fluid.   The following portions of the patient's history were reviewed and updated as appropriate: allergies, current medications, past family history, past medical history, past social history, past surgical history and problem list. Problem list updated.  Objective:   Vitals:   01/08/16 0957  BP: (!) 137/94  Pulse: 93  Weight: 205 lb (93 kg)    Fetal Status: Fetal Heart Rate (bpm): 150   Movement: Present     General:  Alert, oriented and cooperative. Patient is in no acute distress.  Skin: Skin is warm and dry. No rash noted.   Cardiovascular: Normal heart rate noted  Respiratory: Normal respiratory effort, no problems with respiration noted  Abdomen: Soft, gravid, appropriate for gestational age. Pain/Pressure: Absent     Pelvic:  Cervical exam deferred        Extremities: Normal range of motion.  Edema: Mild pitting, slight indentation  Mental Status: Normal mood and affect. Normal behavior. Normal judgment and thought content.   Assessment and Plan:  Pregnancy: G1P0 at 579w5d  1. Essential hypertension - add aldomet 250 mg BID - add baby asa daily   2. Pre-existing type 1 diabetes mellitus during pregnancy, antepartum - She reports that her hba1c is now 6.4, down from 7.2.  - MFM appt  - upt on opthalmology -fetal echo and growth u/s today  3. Supervision of high risk pregnancy, antepartum   Preterm labor symptoms and  general obstetric precautions including but not limited to vaginal bleeding, contractions, leaking of fluid and fetal movement were reviewed in detail with the patient. Please refer to After Visit Summary for other counseling recommendations.  Return in about 2 weeks (around 01/22/2016).   Allie BossierMyra C Yarrow Linhart, MD

## 2016-01-09 ENCOUNTER — Other Ambulatory Visit (HOSPITAL_COMMUNITY): Payer: Self-pay | Admitting: *Deleted

## 2016-01-09 ENCOUNTER — Encounter (HOSPITAL_COMMUNITY): Payer: Self-pay

## 2016-01-09 DIAGNOSIS — IMO0001 Reserved for inherently not codable concepts without codable children: Secondary | ICD-10-CM

## 2016-01-09 DIAGNOSIS — Z794 Long term (current) use of insulin: Principal | ICD-10-CM

## 2016-01-09 DIAGNOSIS — O24319 Unspecified pre-existing diabetes mellitus in pregnancy, unspecified trimester: Principal | ICD-10-CM

## 2016-01-13 DIAGNOSIS — M9901 Segmental and somatic dysfunction of cervical region: Secondary | ICD-10-CM | POA: Diagnosis not present

## 2016-01-13 DIAGNOSIS — E108 Type 1 diabetes mellitus with unspecified complications: Secondary | ICD-10-CM | POA: Diagnosis not present

## 2016-01-13 DIAGNOSIS — M9904 Segmental and somatic dysfunction of sacral region: Secondary | ICD-10-CM | POA: Diagnosis not present

## 2016-01-13 DIAGNOSIS — M9902 Segmental and somatic dysfunction of thoracic region: Secondary | ICD-10-CM | POA: Diagnosis not present

## 2016-01-13 DIAGNOSIS — M9903 Segmental and somatic dysfunction of lumbar region: Secondary | ICD-10-CM | POA: Diagnosis not present

## 2016-01-17 DIAGNOSIS — Z83511 Family history of glaucoma: Secondary | ICD-10-CM | POA: Diagnosis not present

## 2016-01-17 DIAGNOSIS — E103393 Type 1 diabetes mellitus with moderate nonproliferative diabetic retinopathy without macular edema, bilateral: Secondary | ICD-10-CM | POA: Diagnosis not present

## 2016-01-17 DIAGNOSIS — H40013 Open angle with borderline findings, low risk, bilateral: Secondary | ICD-10-CM | POA: Diagnosis not present

## 2016-01-17 DIAGNOSIS — H30041 Focal chorioretinal inflammation, macular or paramacular, right eye: Secondary | ICD-10-CM | POA: Diagnosis not present

## 2016-01-17 DIAGNOSIS — H5213 Myopia, bilateral: Secondary | ICD-10-CM | POA: Diagnosis not present

## 2016-01-24 ENCOUNTER — Ambulatory Visit (INDEPENDENT_AMBULATORY_CARE_PROVIDER_SITE_OTHER): Payer: 59 | Admitting: Advanced Practice Midwife

## 2016-01-24 VITALS — BP 137/81 | HR 90 | Wt 210.0 lb

## 2016-01-24 DIAGNOSIS — O24012 Pre-existing diabetes mellitus, type 1, in pregnancy, second trimester: Secondary | ICD-10-CM

## 2016-01-24 DIAGNOSIS — O9989 Other specified diseases and conditions complicating pregnancy, childbirth and the puerperium: Secondary | ICD-10-CM

## 2016-01-24 DIAGNOSIS — O99891 Other specified diseases and conditions complicating pregnancy: Secondary | ICD-10-CM

## 2016-01-24 DIAGNOSIS — O10912 Unspecified pre-existing hypertension complicating pregnancy, second trimester: Secondary | ICD-10-CM

## 2016-01-24 DIAGNOSIS — M549 Dorsalgia, unspecified: Secondary | ICD-10-CM

## 2016-01-24 DIAGNOSIS — O26892 Other specified pregnancy related conditions, second trimester: Secondary | ICD-10-CM

## 2016-01-24 DIAGNOSIS — O0992 Supervision of high risk pregnancy, unspecified, second trimester: Secondary | ICD-10-CM

## 2016-01-24 NOTE — Patient Instructions (Signed)
Third Trimester of Pregnancy The third trimester is from week 29 through week 40 (months 7 through 9). The third trimester is a time when the unborn baby (fetus) is growing rapidly. At the end of the ninth month, the fetus is about 20 inches in length and weighs 6-10 pounds. Body changes during your third trimester Your body goes through many changes during pregnancy. The changes vary from woman to woman. During the third trimester:  Your weight will continue to increase. You can expect to gain 25-35 pounds (11-16 kg) by the end of the pregnancy.  You may begin to get stretch marks on your hips, abdomen, and breasts.  You may urinate more often because the fetus is moving lower into your pelvis and pressing on your bladder.  You may develop or continue to have heartburn. This is caused by increased hormones that slow down muscles in the digestive tract.  You may develop or continue to have constipation because increased hormones slow digestion and cause the muscles that push waste through your intestines to relax.  You may develop hemorrhoids. These are swollen veins (varicose veins) in the rectum that can itch or be painful.  You may develop swollen, bulging veins (varicose veins) in your legs.  You may have increased body aches in the pelvis, back, or thighs. This is due to weight gain and increased hormones that are relaxing your joints.  You may have changes in your hair. These can include thickening of your hair, rapid growth, and changes in texture. Some women also have hair loss during or after pregnancy, or hair that feels dry or thin. Your hair will most likely return to normal after your baby is born.  Your breasts will continue to grow and they will continue to become tender. A yellow fluid (colostrum) may leak from your breasts. This is the first milk you are producing for your baby.  Your belly button may stick out.  You may notice more swelling in your hands, face, or  ankles.  You may have increased tingling or numbness in your hands, arms, and legs. The skin on your belly may also feel numb.  You may feel short of breath because of your expanding uterus.  You may have more problems sleeping. This can be caused by the size of your belly, increased need to urinate, and an increase in your body's metabolism.  You may notice the fetus "dropping," or moving lower in your abdomen.  You may have increased vaginal discharge.  Your cervix becomes thin and soft (effaced) near your due date. What to expect at prenatal visits You will have prenatal exams every 2 weeks until week 36. Then you will have weekly prenatal exams. During a routine prenatal visit:  You will be weighed to make sure you and the fetus are growing normally.  Your blood pressure will be taken.  Your abdomen will be measured to track your baby's growth.  The fetal heartbeat will be listened to.  Any test results from the previous visit will be discussed.  You may have a cervical check near your due date to see if you have effaced. At around 36 weeks, your health care provider will check your cervix. At the same time, your health care provider will also perform a test on the secretions of the vaginal tissue. This test is to determine if a type of bacteria, Group B streptococcus, is present. Your health care provider will explain this further. Your health care provider may ask you:    What your birth plan is.  How you are feeling.  If you are feeling the baby move.  If you have had any abnormal symptoms, such as leaking fluid, bleeding, severe headaches, or abdominal cramping.  If you are using any tobacco products, including cigarettes, chewing tobacco, and electronic cigarettes.  If you have any questions. Other tests or screenings that may be performed during your third trimester include:  Blood tests that check for low iron levels (anemia).  Fetal testing to check the health,  activity level, and growth of the fetus. Testing is done if you have certain medical conditions or if there are problems during the pregnancy.  Nonstress test (NST). This test checks the health of your baby to make sure there are no signs of problems, such as the baby not getting enough oxygen. During this test, a belt is placed around your belly. The baby is made to move, and its heart rate is monitored during movement. What is false labor? False labor is a condition in which you feel small, irregular tightenings of the muscles in the womb (contractions) that eventually go away. These are called Braxton Hicks contractions. Contractions may last for hours, days, or even weeks before true labor sets in. If contractions come at regular intervals, become more frequent, increase in intensity, or become painful, you should see your health care provider. What are the signs of labor?  Abdominal cramps.  Regular contractions that start at 10 minutes apart and become stronger and more frequent with time.  Contractions that start on the top of the uterus and spread down to the lower abdomen and back.  Increased pelvic pressure and dull back pain.  A watery or bloody mucus discharge that comes from the vagina.  Leaking of amniotic fluid. This is also known as your "water breaking." It could be a slow trickle or a gush. Let your doctor know if it has a color or strange odor. If you have any of these signs, call your health care provider right away, even if it is before your due date. Follow these instructions at home: Eating and drinking  Continue to eat regular, healthy meals.  Do not eat:  Raw meat or meat spreads.  Unpasteurized milk or cheese.  Unpasteurized juice.  Store-made salad.  Refrigerated smoked seafood.  Hot dogs or deli meat, unless they are piping hot.  More than 6 ounces of albacore tuna a week.  Shark, swordfish, king mackerel, or tile fish.  Store-made salads.  Raw  sprouts, such as mung bean or alfalfa sprouts.  Take prenatal vitamins as told by your health care provider.  Take 1000 mg of calcium daily as told by your health care provider.  If you develop constipation:  Take over-the-counter or prescription medicines.  Drink enough fluid to keep your urine clear or pale yellow.  Eat foods that are high in fiber, such as fresh fruits and vegetables, whole grains, and beans.  Limit foods that are high in fat and processed sugars, such as fried and sweet foods. Activity  Exercise only as directed by your health care provider. Healthy pregnant women should aim for 2 hours and 30 minutes of moderate exercise per week. If you experience any pain or discomfort while exercising, stop.  Avoid heavy lifting.  Do not exercise in extreme heat or humidity, or at high altitudes.  Wear low-heel, comfortable shoes.  Practice good posture.  Do not travel far distances unless it is absolutely necessary and only with the approval   of your health care provider.  Wear your seat belt at all times while in a car, on a bus, or on a plane.  Take frequent breaks and rest with your legs elevated if you have leg cramps or low back pain.  Do not use hot tubs, steam rooms, or saunas.  You may continue to have sex unless your health care provider tells you otherwise. Lifestyle  Do not use any products that contain nicotine or tobacco, such as cigarettes and e-cigarettes. If you need help quitting, ask your health care provider.  Do not drink alcohol.  Do not use any medicinal herbs or unprescribed drugs. These chemicals affect the formation and growth of the baby.  If you develop varicose veins:  Wear support pantyhose or compression stockings as told by your healthcare provider.  Elevate your feet for 15 minutes, 3-4 times a day.  Wear a supportive maternity bra to help with breast tenderness. General instructions  Take over-the-counter and prescription  medicines only as told by your health care provider. There are medicines that are either safe or unsafe to take during pregnancy.  Take warm sitz baths to soothe any pain or discomfort caused by hemorrhoids. Use hemorrhoid cream or witch hazel if your health care provider approves.  Avoid cat litter boxes and soil used by cats. These carry germs that can cause birth defects in the baby. If you have a cat, ask someone to clean the litter box for you.  To prepare for the arrival of your baby:  Take prenatal classes to understand, practice, and ask questions about the labor and delivery.  Make a trial run to the hospital.  Visit the hospital and tour the maternity area.  Arrange for maternity or paternity leave through employers.  Arrange for family and friends to take care of pets while you are in the hospital.  Purchase a rear-facing car seat and make sure you know how to install it in your car.  Pack your hospital bag.  Prepare the baby's nursery. Make sure to remove all pillows and stuffed animals from the baby's crib to prevent suffocation.  Visit your dentist if you have not gone during your pregnancy. Use a soft toothbrush to brush your teeth and be gentle when you floss.  Keep all prenatal follow-up visits as told by your health care provider. This is important. Contact a health care provider if:  You are unsure if you are in labor or if your water has broken.  You become dizzy.  You have mild pelvic cramps, pelvic pressure, or nagging pain in your abdominal area.  You have lower back pain.  You have persistent nausea, vomiting, or diarrhea.  You have an unusual or bad smelling vaginal discharge.  You have pain when you urinate. Get help right away if:  You have a fever.  You are leaking fluid from your vagina.  You have spotting or bleeding from your vagina.  You have severe abdominal pain or cramping.  You have rapid weight loss or weight gain.  You have  shortness of breath with chest pain.  You notice sudden or extreme swelling of your face, hands, ankles, feet, or legs.  Your baby makes fewer than 10 movements in 2 hours.  You have severe headaches that do not go away with medicine.  You have vision changes. Summary  The third trimester is from week 29 through week 40, months 7 through 9. The third trimester is a time when the unborn baby (fetus)   is growing rapidly.  During the third trimester, your discomfort may increase as you and your baby continue to gain weight. You may have abdominal, leg, and back pain, sleeping problems, and an increased need to urinate.  During the third trimester your breasts will keep growing and they will continue to become tender. A yellow fluid (colostrum) may leak from your breasts. This is the first milk you are producing for your baby.  False labor is a condition in which you feel small, irregular tightenings of the muscles in the womb (contractions) that eventually go away. These are called Braxton Hicks contractions. Contractions may last for hours, days, or even weeks before true labor sets in.  Signs of labor can include: abdominal cramps; regular contractions that start at 10 minutes apart and become stronger and more frequent with time; watery or bloody mucus discharge that comes from the vagina; increased pelvic pressure and dull back pain; and leaking of amniotic fluid. This information is not intended to replace advice given to you by your health care provider. Make sure you discuss any questions you have with your health care provider. Document Released: 01/20/2001 Document Revised: 07/04/2015 Document Reviewed: 03/29/2012 Elsevier Interactive Patient Education  2017 [employer]  

## 2016-01-24 NOTE — Progress Notes (Signed)
   PRENATAL VISIT NOTE  Subjective:  Valerie Dennis is a 30 y.o. G1P0 at 2051w0d being seen today for ongoing prenatal care.  She is currently monitored for the following issues for this high-risk pregnancy and has HLD (hyperlipidemia); BP (high blood pressure); Hypertrophy of nasal turbinates; Type 1 diabetes mellitus (HCC); Supervision of high risk pregnancy, antepartum; and Diabetes mellitus in pregnancy on her problem list.  Patient reports backache.  Contractions: Not present. Vag. Bleeding: None.  Movement: Present. Denies leaking of fluid.   The following portions of the patient's history were reviewed and updated as appropriate: allergies, current medications, past family history, past medical history, past social history, past surgical history and problem list. Problem list updated.  Objective:   Vitals:   01/24/16 0813  BP: 137/81  Pulse: 90  Weight: 210 lb (95.3 kg)    Fetal Status: Fetal Heart Rate (bpm): 141 Fundal Height: 27 cm Movement: Present     General:  Alert, oriented and cooperative. Patient is in no acute distress.  Skin: Skin is warm and dry. No rash noted.   Cardiovascular: Normal heart rate noted  Respiratory: Normal respiratory effort, no problems with respiration noted  Abdomen: Soft, gravid, appropriate for gestational age. Pain/Pressure: Absent     Pelvic:  Cervical exam deferred        Extremities: Normal range of motion.  Edema: None  Mental Status: Normal mood and affect. Normal behavior. Normal judgment and thought content.   Assessment and Plan:  Pregnancy: G1P0 at 5451w0d  1. High-risk pregnancy in second trimester   2. Pre-existing type 1 diabetes mellitus during pregnancy in second trimester --CBGs wnl, on insulin pump  3. Chronic hypertension complicating or reason for care during pregnancy, second trimester --Pt was prescribed methyldopa by primary care prior to pregnancy, did not tolerate labetalol. When BP at home were in 130s/80s this  week, she resumed her methyldopa 250 mg for last 2 days.  Discussed with pt that usually wait until higher BPs to treat in pregnancy. 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 Today she denies h/a, epigastric pain, or visual disturbances.  Preeclampsia precautions reviewed.  4. Back pain affecting pregnancy in second trimester --Mostly at night when lying in bed.  Recommend wear pregnancy support belt consistently in the daytime. Rest/heat/Tylenol.  Discussed Flexeril PRN but pt desires to wait and try nonpharm options.  Preterm labor symptoms and general obstetric precautions including but not limited to vaginal bleeding, contractions, leaking of fluid and fetal movement were reviewed in detail with the patient. Please refer to After Visit Summary for other counseling recommendations.  Return in about 2 weeks (around 02/07/2016).   Hurshel PartyLisa A Leftwich-Kirby, CNM

## 2016-02-06 ENCOUNTER — Other Ambulatory Visit: Payer: Self-pay

## 2016-02-06 ENCOUNTER — Ambulatory Visit (HOSPITAL_COMMUNITY)
Admission: RE | Admit: 2016-02-06 | Discharge: 2016-02-06 | Disposition: A | Discharge status: Home / Self Care | Payer: 59 | Source: Ambulatory Visit | Attending: Obstetrics & Gynecology | Admitting: Obstetrics & Gynecology

## 2016-02-06 ENCOUNTER — Encounter (HOSPITAL_COMMUNITY): Payer: Self-pay

## 2016-02-06 ENCOUNTER — Ambulatory Visit: Payer: Self-pay

## 2016-02-06 VITALS — BP 140/86 | HR 88 | Ht 64.0 in

## 2016-02-06 DIAGNOSIS — E109 Type 1 diabetes mellitus without complications: Secondary | ICD-10-CM

## 2016-02-06 DIAGNOSIS — Z794 Long term (current) use of insulin: Secondary | ICD-10-CM

## 2016-02-06 DIAGNOSIS — Z362 Encounter for other antenatal screening follow-up: Secondary | ICD-10-CM | POA: Diagnosis not present

## 2016-02-06 DIAGNOSIS — Z3A27 27 weeks gestation of pregnancy: Secondary | ICD-10-CM | POA: Insufficient documentation

## 2016-02-06 DIAGNOSIS — O10012 Pre-existing essential hypertension complicating pregnancy, second trimester: Secondary | ICD-10-CM | POA: Diagnosis not present

## 2016-02-06 DIAGNOSIS — IMO0001 Reserved for inherently not codable concepts without codable children: Secondary | ICD-10-CM

## 2016-02-06 DIAGNOSIS — O24012 Pre-existing diabetes mellitus, type 1, in pregnancy, second trimester: Secondary | ICD-10-CM | POA: Diagnosis not present

## 2016-02-06 DIAGNOSIS — O24319 Unspecified pre-existing diabetes mellitus in pregnancy, unspecified trimester: Secondary | ICD-10-CM

## 2016-02-06 MED FILL — NOVOLOG FLEXPEN SYRINGE: 100 | 30 days supply | Qty: 30 | Fill #2

## 2016-02-06 NOTE — Patient Outreach (Signed)
[facility] Care Management   02/06/2016  Frazier ButtStephanie Tussey 03/08/1985 [id number]  Frazier ButtStephanie Godman is an 30 y.o. female.   Member seen for follow up office visit for Link to Wellness program for self management of Type 1 diabetes  Subjective: Member states that her pregnancy is going well and she is followed closely for her diabetes by her OB/GYN and her endocrinologist.  States she is to have an ultrasound later today.  States she is trying to keep her blood sugars in the range that they want her to but her sugars usually are 80-100 in the AM.  States she is wearing her sensor which has made it easier to keep her blood sugars lower.  States her last hemoglobin A1C was down to 6.4%.  States she walking for exercise 2-3 times a week and is active at work  Objective:   ROS  Physical Exam Today's Vitals   02/06/16 1449 02/06/16 1453  BP: 140/86   Pulse: 88   SpO2: 100%   Height: 1.626 m (5\' 4" )   PainSc: 0-No pain 0-No pain   Encounter Medications:   Outpatient Encounter Prescriptions as of 02/06/2016  Medication Sig Note  . BAYER CONTOUR NEXT TEST test strip  01/24/2016: Received from: External Pharmacy  . glucagon (GLUCAGON EMERGENCY) 1 MG injection Inject 1 mg into the vein once as needed.   . insulin aspart (NOVOLOG) 100 UNIT/ML injection Inject 1 Units into the skin continuous. Insulin pump   . NOVOLOG FLEXPEN 100 UNIT/ML FlexPen  09/23/2015: Received from: External Pharmacy  . Prenatal Vit-Fe Fumarate-FA (MULTIVITAMIN-PRENATAL) 27-0.8 MG TABS tablet Take 1 tablet by mouth daily at 12 noon.   . docusate sodium (COLACE) 50 MG capsule Take 1 capsule (50 mg total) by mouth 2 (two) times daily. (Patient not taking: Reported on 02/06/2016)   . ipratropium (ATROVENT) 0.03 % nasal spray Place 2 sprays into both nostrils 2 (two) times daily as needed for rhinitis.   . methyldopa (ALDOMET) 250 MG tablet Take 250 mg by mouth 2 (two) times daily.   . mometasone (NASONEX) 50  MCG/ACT nasal spray Place 2 sprays into the nose daily.    No facility-administered encounter medications on file as of 02/06/2016.     Functional Status:   In your present state of health, do you have any difficulty performing the following activities: 10/28/2015 06/24/2015  Hearing? N N  Vision? N N  Difficulty concentrating or making decisions? N N  Walking or climbing stairs? N N  Dressing or bathing? N N  Doing errands, shopping? N N    Fall/Depression Screening:    PHQ 2/9 Scores 10/28/2015 06/24/2015 04/23/2015 03/14/2015 02/18/2015  PHQ - 2 Score 0 0 0 0 0    Assessment:  Member seen for follow up office visit for Link to Wellness program for self management of Type 1 diabetes. She is now meeting Hemoglobin A1C goal of below 7% with hemoglobin A1C of 6.4%%. Member is pregnant  and is being followed closely by her OB/GYN and endocrinologist to control her blood sugars.  Member is due on 05/01/16.  Member now has the  the Medtronic MiniMed 670G with Enlite sensor but is using in manual mode. Member is watching her CHO intake and giving her bolus accordingly. Member has  been walking for exercise.  Plan:   1. Plan to eat 3 meals and 2 small snacks 2. Plan to walk 2 times a walk 15-30 minutes a week 3. Plan to return to  Link to Wellness on 05/19/16 at 1:30PM   [facility] Problem One   Flowsheet Row Most Recent Value  Care Plan Problem One  Elevated blood sugars related to dx of Type 1 DM  Role Documenting the Problem One  Care Management Coordinator  Care Plan for Problem One  Active  [facility] Long Term Goal (31-90 days)  Member will decrease hemoglobin A1C to 7 or below in the next 90 days  [facility] Long Term Goal Start Date  02/06/16 [Continue hemoglobin A1C down to 6.4%]  Interventions for Problem One Long Term Goal  Reinforced on importance of keeping her blood sugars tighly controlled while pregnant, Reinforced to eat small frequent meals if having nausea,  Reinforced CHO counting  and portion control and to try to give insulin bolus based on her CHO intak, Encouraged to eat snacks with protein and 15 gm of CHO, Reinforced actions to take for hypoglycemia, Reinforced on importance of regular exercise for glycemic control and to walk as tolerated    Dudley MajorMelissa Olanrewaju Osborn RN, [facility],CCM Care Management Coordinator-Link to Wellness [facility] Care [facility] [phone]

## 2016-02-06 NOTE — Patient Instructions (Signed)
1. Plan to eat 3 meals and 2 small snacks 2. Plan to walk 2 times a walk 15-30 minutes a week 3. Plan to return to Link to Wellness on 05/19/16 at 1:30PM

## 2016-02-07 MED FILL — CONTOUR NEXT STRIPS: 38 days supply | Qty: 300 | Fill #0

## 2016-02-11 ENCOUNTER — Other Ambulatory Visit (HOSPITAL_COMMUNITY): Payer: Self-pay | Admitting: *Deleted

## 2016-02-11 DIAGNOSIS — IMO0001 Reserved for inherently not codable concepts without codable children: Secondary | ICD-10-CM

## 2016-02-11 DIAGNOSIS — O24313 Unspecified pre-existing diabetes mellitus in pregnancy, third trimester: Principal | ICD-10-CM

## 2016-02-11 DIAGNOSIS — Z794 Long term (current) use of insulin: Principal | ICD-10-CM

## 2016-02-14 DIAGNOSIS — E108 Type 1 diabetes mellitus with unspecified complications: Secondary | ICD-10-CM | POA: Diagnosis not present

## 2016-02-18 ENCOUNTER — Ambulatory Visit (INDEPENDENT_AMBULATORY_CARE_PROVIDER_SITE_OTHER): Payer: 59 | Admitting: Obstetrics & Gynecology

## 2016-02-18 VITALS — BP 133/75 | HR 92 | Wt 219.0 lb

## 2016-02-18 DIAGNOSIS — O24913 Unspecified diabetes mellitus in pregnancy, third trimester: Secondary | ICD-10-CM

## 2016-02-18 DIAGNOSIS — O10913 Unspecified pre-existing hypertension complicating pregnancy, third trimester: Secondary | ICD-10-CM

## 2016-02-18 DIAGNOSIS — O0990 Supervision of high risk pregnancy, unspecified, unspecified trimester: Secondary | ICD-10-CM | POA: Diagnosis not present

## 2016-02-18 DIAGNOSIS — I10 Essential (primary) hypertension: Secondary | ICD-10-CM

## 2016-02-18 DIAGNOSIS — E109 Type 1 diabetes mellitus without complications: Secondary | ICD-10-CM

## 2016-02-18 NOTE — Progress Notes (Signed)
   PRENATAL VISIT NOTE  Subjective:  Valerie Dennis is a 31 y.o. G1P0 at 1976w4d being seen today for ongoing prenatal care.  She is currently monitored for the following issues for this high-risk pregnancy and has HLD (hyperlipidemia); BP (high blood pressure); Hypertrophy of nasal turbinates; Type 1 diabetes mellitus (HCC); Supervision of high risk pregnancy, antepartum; and Diabetes mellitus in pregnancy on her problem list.  Patient reports back pain midline at bra strap, worse at night when laying down..  Contractions: Not present. Vag. Bleeding: None.  Movement: Present. Denies leaking of fluid.   The following portions of the patient's history were reviewed and updated as appropriate: allergies, current medications, past family history, past medical history, past social history, past surgical history and problem list. Problem list updated.  Objective:   Vitals:   02/18/16 1602  BP: 133/75  Pulse: 92  Weight: 219 lb (99.3 kg)    Fetal Status: Fetal Heart Rate (bpm): 143 Fundal Height: 32 cm Movement: Present     General:  Alert, oriented and cooperative. Patient is in no acute distress.  Skin: Skin is warm and dry. No rash noted.   Cardiovascular: Normal heart rate noted  Respiratory: Normal respiratory effort, no problems with respiration noted  Abdomen: Soft, gravid, appropriate for gestational age. Pain/Pressure: Absent     Pelvic:  Cervical exam deferred        Extremities: Normal range of motion.  Edema: None  Mental Status: Normal mood and affect. Normal behavior. Normal judgment and thought content.   Assessment and Plan:  Pregnancy: G1P0 at 1276w4d  1. Supervision of high risk pregnancy, antepartum - Refuses Tdap this week, will take next visit - Hemoglobin A1c - CBC - HIV antibody (with reflex) - RPR  2. Type 1 diabetes mellitus without complication (HCC) Better controlled off ADHD meds Hgb A1c today  3. Essential hypertension nml BP  Preterm labor symptoms  and general obstetric precautions including but not limited to vaginal bleeding, contractions, leaking of fluid and fetal movement were reviewed in detail with the patient. Please refer to After Visit Summary for other counseling recommendations.  Return in 2 weeks (on 03/03/2016).   Lesly DukesKelly H Leggett, MD

## 2016-02-20 LAB — CBC
Hematocrit: 39.1 % (ref 34.0–46.6)
Hemoglobin: 12.6 g/dL (ref 11.1–15.9)
MCH: 29.6 pg (ref 26.6–33.0)
MCHC: 32.2 g/dL (ref 31.5–35.7)
MCV: 92 fL (ref 79–97)
Platelets: 228 10*3/uL (ref 150–379)
RBC: 4.26 x10E6/uL (ref 3.77–5.28)
RDW: 14.1 % (ref 12.3–15.4)
WBC: 11 10*3/uL — ABNORMAL HIGH (ref 3.4–10.8)

## 2016-02-20 LAB — RPR: RPR Ser Ql: NONREACTIVE

## 2016-02-20 LAB — HEMOGLOBIN A1C
Est. average glucose Bld gHb Est-mCnc: 140 mg/dL
Hgb A1c MFr Bld: 6.5 % — ABNORMAL HIGH (ref 4.8–5.6)

## 2016-02-20 LAB — HIV ANTIBODY (ROUTINE TESTING W REFLEX): HIV Screen 4th Generation wRfx: NONREACTIVE

## 2016-02-24 DIAGNOSIS — E108 Type 1 diabetes mellitus with unspecified complications: Secondary | ICD-10-CM | POA: Diagnosis not present

## 2016-02-24 DIAGNOSIS — Z794 Long term (current) use of insulin: Secondary | ICD-10-CM | POA: Diagnosis not present

## 2016-02-28 MED FILL — HUMALOG 100 UNITS/ML KWIKPE: 100 | 60 days supply | Qty: 45 | Fill #0

## 2016-03-02 DIAGNOSIS — H30041 Focal chorioretinal inflammation, macular or paramacular, right eye: Secondary | ICD-10-CM | POA: Diagnosis not present

## 2016-03-02 DIAGNOSIS — E103393 Type 1 diabetes mellitus with moderate nonproliferative diabetic retinopathy without macular edema, bilateral: Secondary | ICD-10-CM | POA: Diagnosis not present

## 2016-03-02 DIAGNOSIS — H40013 Open angle with borderline findings, low risk, bilateral: Secondary | ICD-10-CM | POA: Diagnosis not present

## 2016-03-02 DIAGNOSIS — H5213 Myopia, bilateral: Secondary | ICD-10-CM | POA: Diagnosis not present

## 2016-03-03 ENCOUNTER — Ambulatory Visit (INDEPENDENT_AMBULATORY_CARE_PROVIDER_SITE_OTHER): Payer: 59 | Admitting: Obstetrics & Gynecology

## 2016-03-03 VITALS — BP 120/75 | HR 109 | Wt 221.0 lb

## 2016-03-03 DIAGNOSIS — O0990 Supervision of high risk pregnancy, unspecified, unspecified trimester: Secondary | ICD-10-CM

## 2016-03-03 DIAGNOSIS — I10 Essential (primary) hypertension: Secondary | ICD-10-CM

## 2016-03-03 DIAGNOSIS — O24019 Pre-existing diabetes mellitus, type 1, in pregnancy, unspecified trimester: Secondary | ICD-10-CM

## 2016-03-04 ENCOUNTER — Encounter: Payer: Self-pay | Admitting: Obstetrics & Gynecology

## 2016-03-04 DIAGNOSIS — F909 Attention-deficit hyperactivity disorder, unspecified type: Secondary | ICD-10-CM | POA: Insufficient documentation

## 2016-03-04 NOTE — Progress Notes (Signed)
   PRENATAL VISIT NOTE  Subjective:  Valerie Dennis is a 31 y.o. G1P0 at 8473w5d being seen today for ongoing prenatal care.  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  Patient reports decreased sense of taste.  Contractions: Not present. Vag. Bleeding: None.  Movement: Present. Denies leaking of fluid.   The following portions of the patient's history were reviewed and updated as appropriate: allergies, current medications, past family history, past medical history, past social history, past surgical history and problem list. Problem list updated.  Objective:   Vitals:   03/03/16 1553  BP: 120/75  Pulse: (!) 109  Weight: 221 lb (100.2 kg)    Fetal Status: Fetal Heart Rate (bpm): 142   Movement: Present     General:  Alert, oriented and cooperative. Patient is in no acute distress.  Skin: Skin is warm and dry. No rash noted.   Cardiovascular: Normal heart rate noted  Respiratory: Normal respiratory effort, no problems with respiration noted  Abdomen: Soft, gravid, appropriate for gestational age. Pain/Pressure: Absent     Pelvic:  Cervical exam deferred        Extremities: Normal range of motion.  Edema: None  Mental Status: Normal mood and affect. Normal behavior. Normal judgment and thought content.   Assessment and Plan:  Pregnancy: G1P0 at 373w5d  1.  Taste disturbance--?due to viral illness or chronic nasal issues  2.  Type 1 DM--Pt reports good glycemic control.  Has growth US early Feb.  Twice weekly testing (or weekly BPP--pt has work issues).    3.  Back pain--going to see  Dr. Adrian BlackwaterStinson for next appt and manipulation  4.  HTN--no problems today.  5.  Palpations--pt not SOB or syncopal.  Pt would like to monitor before going to cardiology workup.  If worsens, has syncope, or SOB-->Pt will call us.   Preterm labor symptoms and general obstetric precautions including but not limited to vaginal bleeding, contractions, leaking of fluid and fetal movement were reviewed in detail with the patient. Please refer to After Visit Summary for other counseling recommendations.   RTC 1 week for biweekly testing and 2 weeks to MD  Lesly DukesKelly H Leggett, MD

## 2016-03-09 MED FILL — CONTOUR NEXT STRIPS: 38 days supply | Qty: 300 | Fill #1

## 2016-03-10 ENCOUNTER — Other Ambulatory Visit: Payer: Self-pay | Admitting: *Deleted

## 2016-03-10 ENCOUNTER — Encounter (HOSPITAL_COMMUNITY): Payer: Self-pay | Admitting: *Deleted

## 2016-03-10 ENCOUNTER — Inpatient Hospital Stay (HOSPITAL_COMMUNITY)
Admission: AD | Admit: 2016-03-10 | Discharge: 2016-03-10 | Disposition: A | Discharge status: Home / Self Care | Payer: 59 | Source: Ambulatory Visit | Attending: Obstetrics and Gynecology | Admitting: Obstetrics and Gynecology

## 2016-03-10 DIAGNOSIS — O9A213 Injury, poisoning and certain other consequences of external causes complicating pregnancy, third trimester: Secondary | ICD-10-CM | POA: Diagnosis not present

## 2016-03-10 DIAGNOSIS — E109 Type 1 diabetes mellitus without complications: Secondary | ICD-10-CM | POA: Insufficient documentation

## 2016-03-10 DIAGNOSIS — O26893 Other specified pregnancy related conditions, third trimester: Secondary | ICD-10-CM | POA: Insufficient documentation

## 2016-03-10 DIAGNOSIS — Z880 Allergy status to penicillin: Secondary | ICD-10-CM | POA: Diagnosis not present

## 2016-03-10 DIAGNOSIS — Y9289 Other specified places as the place of occurrence of the external cause: Secondary | ICD-10-CM | POA: Insufficient documentation

## 2016-03-10 DIAGNOSIS — W000XXA Fall on same level due to ice and snow, initial encounter: Secondary | ICD-10-CM | POA: Insufficient documentation

## 2016-03-10 DIAGNOSIS — O24013 Pre-existing diabetes mellitus, type 1, in pregnancy, third trimester: Secondary | ICD-10-CM | POA: Diagnosis not present

## 2016-03-10 DIAGNOSIS — Z3A32 32 weeks gestation of pregnancy: Secondary | ICD-10-CM | POA: Diagnosis not present

## 2016-03-10 DIAGNOSIS — O9989 Other specified diseases and conditions complicating pregnancy, childbirth and the puerperium: Secondary | ICD-10-CM

## 2016-03-10 DIAGNOSIS — O24019 Pre-existing diabetes mellitus, type 1, in pregnancy, unspecified trimester: Secondary | ICD-10-CM

## 2016-03-10 DIAGNOSIS — Z794 Long term (current) use of insulin: Secondary | ICD-10-CM | POA: Insufficient documentation

## 2016-03-10 LAB — URINALYSIS, ROUTINE W REFLEX MICROSCOPIC
Bilirubin Urine: NEGATIVE
Glucose, UA: 50 mg/dL — AB
Hgb urine dipstick: NEGATIVE
Ketones, ur: NEGATIVE mg/dL
Leukocytes, UA: NEGATIVE
Nitrite: NEGATIVE
Protein, ur: NEGATIVE mg/dL
Specific Gravity, Urine: 1.015 (ref 1.005–1.030)
pH: 6 (ref 5.0–8.0)

## 2016-03-10 MED ORDER — CYCLOBENZAPRINE HCL 5 MG PO TABS: 5.0000 mg | ORAL_TABLET | Freq: Three times a day (TID) | ORAL | 0 refills | Status: DC | PRN

## 2016-03-10 MED ORDER — ACETAMINOPHEN 325 MG PO TABS
650.0000 mg | ORAL_TABLET | Freq: Once | ORAL | Status: AC
  Administered 2016-03-10: 650 mg via ORAL
  Filled 2016-03-10: qty 2

## 2016-03-10 MED FILL — CYCLOBENZAPRINE 5 MG TABLET: 5 | 3 days supply | Qty: 10 | Fill #0

## 2016-03-10 MED FILL — MOMETASONE FUROATE 50 MCG S: 50 | 30 days supply | Qty: 17 | Fill #4

## 2016-03-10 NOTE — MAU Provider Note (Signed)
Chief Complaint:  Fall  HPI: Valerie Dennis is a 31 y.o. G1P0 at [redacted]w[redacted]d who presents to maternity admissions reporting fall.  At 7:50 AM this morning, patient was getting out of her car to go into work, when she fell on ice, landed on left hip. Her entire back from left hip to neck is hurting. Patient is not feeling contractions. She is feeling baby move now, but did not feel baby move after the incident. Did not fall on belly at all. Did not hit head. Not having any VB or LOF.    Pregnancy Course: Type I diabetic, GDM-class R  Past Medical History: Past Medical History:  Diagnosis Date  . Diabetes mellitus without complication (HCC)   . Hypertension     Past obstetric history: OB History  Gravida Para Term Preterm AB Living  1            SAB TAB Ectopic Multiple Live Births               # Outcome Date GA Lbr Len/2nd Weight Sex Delivery Anes PTL Lv  1 Current               Past Surgical History: Past Surgical History:  Procedure Laterality Date  . CAUTERY OF TURBINATES  2003  . DE QUERVAIN'S RELEASE  2016  . WISDOM TOOTH EXTRACTION       Family History: Family History  Problem Relation Age of Onset  . Hyperlipidemia Mother   . Rheum arthritis Mother   . Heart disease Father   . Hyperlipidemia Father   . Hypertension Father   . Stroke Father   . Hypertension Sister   . Heart disease Maternal Grandmother   . Heart disease Maternal Grandfather   . Heart disease Paternal Grandfather   . Hypertension Paternal Grandfather   . Diabetes Paternal Grandfather     Social History: Social History  Substance Use Topics  . Smoking status: Never Smoker  . Smokeless tobacco: Never Used  . Alcohol use 0.0 oz/week     Comment: Socially before pregnant    Allergies:  Allergies  Allergen Reactions  . Amoxicillin Rash    Has patient had a PCN reaction causing immediate rash, facial/tongue/throat swelling, SOB or lightheadedness with hypotension: no Has patient had a PCN  reaction causing severe rash involving mucus membranes or skin necrosis: yes Has patient had a PCN reaction that required hospitalization no Has patient had a PCN reaction occurring within the last 10 years: no If all of the above answers are "NO", then may proceed with Cephalosporin use.     Meds:  Prescriptions Prior to Admission  Medication Sig Dispense Refill Last Dose  . mometasone (NASONEX) 50 MCG/ACT nasal spray Place 2 sprays into the nose daily.   Past Week at Unknown time  . Prenatal Vit-Fe Fumarate-FA (MULTIVITAMIN-PRENATAL) 27-0.8 MG TABS tablet Take 1 tablet by mouth daily at 12 noon.   03/09/2016 at Unknown time  . insulin lispro (HUMALOG) 100 UNIT/ML KiwkPen Use as directed up to 150 units daily   Taking    I have reviewed patient's Past Medical Hx, Surgical Hx, Family Hx, Social Hx, medications and allergies.   ROS:  A comprehensive ROS was negative except per HPI.    Physical Exam   Patient Vitals for the past 24 hrs:  BP Temp Temp src Pulse Resp  03/10/16 1110 139/78 - - 108 -  03/10/16 1026 129/85 98 F (36.7 C) Oral 115 18   Constitutional:  Well-developed, well-nourished female in no acute distress.  Cardiovascular: normal rate Respiratory: normal effort GI: Abd soft, non-tender, gravid appropriate for gestational age. Pos BS x 4 MS: Extremities nontender, no edema, normal ROM; Tenderness throughout back, L>R, minimal spinal tenderness, mostly muscular tenderness. Good ROM of neck/back. Neurologic: Alert and oriented x 4.  GU: Neg CVAT.  FHT:  Baseline 140 , moderate variability, accelerations present, no decelerations Contractions: None   Labs: Results for orders placed or performed during the hospital encounter of 03/10/16 (from the past 24 hour(s))  Urinalysis, Routine w reflex microscopic     Status: Abnormal   Collection Time: 03/10/16 10:37 AM  Result Value Ref Range   Color, Urine YELLOW YELLOW   APPearance HAZY (A) CLEAR   Specific Gravity,  Urine 1.015 1.005 - 1.030   pH 6.0 5.0 - 8.0   Glucose, UA 50 (A) NEGATIVE mg/dL   Hgb urine dipstick NEGATIVE NEGATIVE   Bilirubin Urine NEGATIVE NEGATIVE   Ketones, ur NEGATIVE NEGATIVE mg/dL   Protein, ur NEGATIVE NEGATIVE mg/dL   Nitrite NEGATIVE NEGATIVE   Leukocytes, UA NEGATIVE NEGATIVE    Imaging:  No results found.  MAU Course: Monitoring for 4 hours after incident - reactive without contractions Tylenol for pain (patient refused Flexeril or Percocet) - will send home on flexeril  I personally reviewed the patient's NST today, found to be REACTIVE. 140 bpm, mod var, +accels, no decels. CTX: None.   MDM: Plan of care reviewed with patient, including labs and tests ordered and medical treatment. Patient was stable during monitoring after fall, no sign of labor/abruption, no contractions during stay, NST was reactive. Discussed signs/symptoms for returning, including but not limited to: regular contractions, painful abdomen, worsening back pain, vaginal bleeding. Will send home on flexeril to be taken at night, discussed may make her drowsy and to not operate vehicle.     Assessment: 1. Traumatic injury during pregnancy in third trimester   2. Pre-existing type 1 diabetes mellitus during pregnancy, antepartum   3. Traumatic injury during pregnancy, third trimester     Plan: Discharge home in stable condition.  Preterm Labor precautions and fetal kick counts Bleeding precautions    Follow-up Information    [facility] [place]. Schedule an appointment as soon as possible for a visit in 1 week(s).   Specialty:  Obstetrics and Gynecology Why:  Routine OB follow up appt Contact information: [address], [address] [place] [id number] [phone]          Allergies as of 03/10/2016      Reactions   Amoxicillin Rash   Has patient had a PCN reaction causing immediate rash, facial/tongue/throat swelling, SOB or  lightheadedness with hypotension: no Has patient had a PCN reaction causing severe rash involving mucus membranes or skin necrosis: yes Has patient had a PCN reaction that required hospitalization no Has patient had a PCN reaction occurring within the last 10 years: no If all of the above answers are "NO", then may proceed with Cephalosporin use.      Medication List    TAKE these medications   cyclobenzaprine 5 MG tablet Commonly known as:  FLEXERIL Take 1 tablet (5 mg total) by mouth 3 (three) times daily as needed for muscle spasms (Back pain).   insulin lispro 100 UNIT/ML KiwkPen Commonly known as:  HUMALOG Use as directed up to 150 units daily   mometasone 50 MCG/ACT nasal spray Commonly known as:  NASONEX Place 2  sprays into the nose daily.   multivitamin-prenatal 27-0.8 MG Tabs tablet Take 1 tablet by mouth daily at 12 noon.       Jen Mow, DO OB Fellow [facility] for [facility] 03/10/2016 1:20 PM

## 2016-03-10 NOTE — Discharge Instructions (Signed)
What Do I Need to Know About Injuries During Pregnancy? °Injuries can happen during pregnancy. Minor falls and accidents usually do not harm you or your baby. However, any injury should be reported to your doctor. °What can I do to protect myself from injuries? °· Remove rugs and loose objects on the floor. °· Wear comfortable shoes that have a good grip. Do not wear high-heeled shoes. °· Always wear your seat belt. The lap belt should be below your belly. Always practice safe driving. °· Do not ride on a motorcycle. °· Do not participate in high-impact activities or sports. °· Avoid: °¨ Walking on wet or slippery floors. °¨ Fires. °¨ Starting fires. °¨ Lifting heavy pots of boiling or hot liquids. °¨ Fixing electrical problems. °· Only take medicine as told by your doctor. °· Know your blood type and the blood type of the baby's father. °· Call your local emergency services (911 in the [place] if you are a victim of domestic violence or assault. For help and support, contact the National Domestic Violence Hotline. °Get help right away if: °· You fall on your belly or have any high-impact accident or injury. °· You have been a victim of domestic violence or any kind of violence. °· You have been in a car accident. °· You have bleeding from your vagina. °· Fluid is leaking from your vagina. °· You start to have belly cramping (contractions) or pain. °· You feel weak or pass out (faint). °· You start to throw up (vomit) after an injury. °· You have been burned. °· You have a stiff neck or neck pain. °· You get a headache or have vision problems after an injury. °· You do not feel the baby move or the baby is not moving as much as normal. °This information is not intended to replace advice given to you by your health care provider. Make sure you discuss any questions you have with your health care provider. °Document Released: 02/28/2010 Document Revised: 07/04/2015 Document Reviewed: 11/02/2012 °Elsevier Interactive  Patient Education © 2017 [employer] ° °

## 2016-03-10 NOTE — MAU Note (Addendum)
Slipped on ice and fell this morning.  Landed on left hip, back is hurting. No bleeding or leaking.  In wc, hurts more to walk

## 2016-03-10 NOTE — Progress Notes (Signed)
efm d/ced per Dr Eustace PenMaumaw

## 2016-03-10 NOTE — Progress Notes (Signed)
Written and verbal d/c instructions given and understanding voiced. 

## 2016-03-11 ENCOUNTER — Ambulatory Visit (INDEPENDENT_AMBULATORY_CARE_PROVIDER_SITE_OTHER): Payer: 59 | Admitting: Family Medicine

## 2016-03-11 VITALS — BP 116/67 | HR 87 | Wt 221.0 lb

## 2016-03-11 DIAGNOSIS — M9902 Segmental and somatic dysfunction of thoracic region: Secondary | ICD-10-CM | POA: Diagnosis not present

## 2016-03-11 DIAGNOSIS — O9989 Other specified diseases and conditions complicating pregnancy, childbirth and the puerperium: Secondary | ICD-10-CM

## 2016-03-11 DIAGNOSIS — O0990 Supervision of high risk pregnancy, unspecified, unspecified trimester: Secondary | ICD-10-CM

## 2016-03-11 DIAGNOSIS — M9908 Segmental and somatic dysfunction of rib cage: Secondary | ICD-10-CM | POA: Diagnosis not present

## 2016-03-11 DIAGNOSIS — O99891 Other specified diseases and conditions complicating pregnancy: Secondary | ICD-10-CM

## 2016-03-11 DIAGNOSIS — E109 Type 1 diabetes mellitus without complications: Secondary | ICD-10-CM

## 2016-03-11 DIAGNOSIS — M9905 Segmental and somatic dysfunction of pelvic region: Secondary | ICD-10-CM

## 2016-03-11 DIAGNOSIS — M549 Dorsalgia, unspecified: Secondary | ICD-10-CM

## 2016-03-11 DIAGNOSIS — O26892 Other specified pregnancy related conditions, second trimester: Secondary | ICD-10-CM | POA: Diagnosis not present

## 2016-03-11 DIAGNOSIS — M9903 Segmental and somatic dysfunction of lumbar region: Secondary | ICD-10-CM | POA: Diagnosis not present

## 2016-03-11 DIAGNOSIS — M9904 Segmental and somatic dysfunction of sacral region: Secondary | ICD-10-CM

## 2016-03-11 NOTE — Progress Notes (Signed)
   PRENATAL VISIT NOTE  Subjective:  Valerie Dennis is a 31 y.o. G1P0 at 3747w5d being seen today for ongoing prenatal care.  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  Patient reports backache. Has been having pain in mid-back across brastrap. Pain worse with laying in bed. Also had fall on ice yesterday - has pain in left lower back, no radiation.  Contractions: Not present. Vag. Bleeding: None.  Movement: Present. Denies leaking of fluid.   The following portions of the patient's history were reviewed and updated as appropriate: allergies, current medications, past family history, past medical history, past social history, past surgical history and problem list. Problem list updated.  Objective:   Vitals:   03/11/16 1501  BP: 116/67  Pulse: 87  Weight: 221 lb (100.2 kg)    Fetal Status:     Movement: Present     General:  Alert, oriented and cooperative. Patient is in no acute distress.  Skin: Skin is warm and dry. No rash noted.   Cardiovascular: Normal heart rate noted  Respiratory: Normal respiratory effort, no problems with respiration noted  Abdomen: Soft, gravid, appropriate for gestational age. Pain/Pressure: Absent     Pelvic:  Cervical exam deferred        MSK: Restriction, tenderness, tissue texture changes, and paraspinal spasm in the thoracic and lumbar spine  Neuro: Moves all four extremities with no focal neurological deficit  Extremities: Normal range of motion.  Edema: None  Mental Status: Normal mood and affect. Normal behavior. Normal judgment and thought content.    OSE: Head   Cervical   Thoracic  T7-8 FSRR, T4 FSRL  Rib  7-8 inhaled  Lumbar L5 ESRL  Sacrum  r/r  Pelvis  r ant innom    Assessment and Plan:  Pregnancy: G1P0 at 7247w5d  1. Supervision of high risk pregnancy, antepartum FH and FHT normal.  2. Type 1 diabetes mellitus without complication (HCC) Under care of endocrinologist. Fasting blood sugars a little elevated - around 100. BPPs weekly - has f/u US and BPP this Friday  3. Back pain affecting pregnancy in second trimester 4. Somatic dysfunction of thoracic region 5. Somatic dysfunction of rib cage region 6. Somatic dysfunction of lumbar region 7. Somatic dysfunction of sacral region 8. Somatic dysfunction of pelvis region OMT done after patient permission. Patient tolerated procedure well - experienced subjective and objective improvement.   Preterm labor symptoms and general obstetric precautions including but not limited to vaginal bleeding, contractions, leaking of fluid and fetal movement were reviewed in detail with the patient. Please refer to After Visit Summary for other counseling recommendations.  No Follow-up on file.  Levie HeritageJacob J Stinson, DO

## 2016-03-13 ENCOUNTER — Other Ambulatory Visit (HOSPITAL_COMMUNITY): Payer: Self-pay | Admitting: Maternal and Fetal Medicine

## 2016-03-13 ENCOUNTER — Other Ambulatory Visit: Payer: Self-pay | Admitting: Obstetrics & Gynecology

## 2016-03-13 ENCOUNTER — Ambulatory Visit (HOSPITAL_COMMUNITY)
Admission: RE | Admit: 2016-03-13 | Discharge: 2016-03-13 | Disposition: A | Discharge status: Home / Self Care | Payer: 59 | Source: Ambulatory Visit | Attending: Obstetrics & Gynecology | Admitting: Obstetrics & Gynecology

## 2016-03-13 ENCOUNTER — Encounter (HOSPITAL_COMMUNITY): Payer: Self-pay

## 2016-03-13 ENCOUNTER — Ambulatory Visit (HOSPITAL_COMMUNITY)
Admission: RE | Admit: 2016-03-13 | Discharge: 2016-03-13 | Disposition: A | Discharge status: Home / Self Care | Payer: 59 | Source: Ambulatory Visit

## 2016-03-13 DIAGNOSIS — O24013 Pre-existing diabetes mellitus, type 1, in pregnancy, third trimester: Secondary | ICD-10-CM | POA: Insufficient documentation

## 2016-03-13 DIAGNOSIS — O10019 Pre-existing essential hypertension complicating pregnancy, unspecified trimester: Secondary | ICD-10-CM

## 2016-03-13 DIAGNOSIS — Z362 Encounter for other antenatal screening follow-up: Secondary | ICD-10-CM

## 2016-03-13 DIAGNOSIS — IMO0001 Reserved for inherently not codable concepts without codable children: Secondary | ICD-10-CM

## 2016-03-13 DIAGNOSIS — O10013 Pre-existing essential hypertension complicating pregnancy, third trimester: Secondary | ICD-10-CM | POA: Diagnosis not present

## 2016-03-13 DIAGNOSIS — O24313 Unspecified pre-existing diabetes mellitus in pregnancy, third trimester: Principal | ICD-10-CM

## 2016-03-13 DIAGNOSIS — Z3A33 33 weeks gestation of pregnancy: Secondary | ICD-10-CM | POA: Insufficient documentation

## 2016-03-13 DIAGNOSIS — Z794 Long term (current) use of insulin: Secondary | ICD-10-CM

## 2016-03-13 DIAGNOSIS — O24019 Pre-existing diabetes mellitus, type 1, in pregnancy, unspecified trimester: Secondary | ICD-10-CM

## 2016-03-13 DIAGNOSIS — Z3A32 32 weeks gestation of pregnancy: Secondary | ICD-10-CM

## 2016-03-18 ENCOUNTER — Ambulatory Visit (HOSPITAL_COMMUNITY): Admission: RE | Admit: 2016-03-18 | Payer: 59 | Source: Ambulatory Visit

## 2016-03-18 DIAGNOSIS — E108 Type 1 diabetes mellitus with unspecified complications: Secondary | ICD-10-CM | POA: Diagnosis not present

## 2016-03-19 ENCOUNTER — Encounter: Payer: Self-pay | Admitting: *Deleted

## 2016-03-19 ENCOUNTER — Ambulatory Visit (INDEPENDENT_AMBULATORY_CARE_PROVIDER_SITE_OTHER): Payer: 59 | Admitting: Obstetrics & Gynecology

## 2016-03-19 VITALS — BP 129/76 | HR 106 | Wt 225.0 lb

## 2016-03-19 DIAGNOSIS — Z3689 Encounter for other specified antenatal screening: Secondary | ICD-10-CM

## 2016-03-19 DIAGNOSIS — O24013 Pre-existing diabetes mellitus, type 1, in pregnancy, third trimester: Secondary | ICD-10-CM | POA: Diagnosis not present

## 2016-03-19 DIAGNOSIS — O0990 Supervision of high risk pregnancy, unspecified, unspecified trimester: Secondary | ICD-10-CM

## 2016-03-21 NOTE — Progress Notes (Signed)
   PRENATAL VISIT NOTE  Subjective:  Valerie Dennis is a 31 y.o. G1P0 at 3028w1d being seen today for ongoing prenatal care.  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  Patient reports no complaints.  Contractions: Not present. Vag. Bleeding: None.  Movement: Present. Denies leaking of fluid.   The following portions of the patient's history were reviewed and updated as appropriate: allergies, current medications, past family history, past medical history, past social history, past surgical history and problem list. Problem list updated.  Objective:   Vitals:   03/19/16 1535  BP: 129/76  Pulse: (!) 106  Weight: 225 lb (102.1 kg)    Fetal Status:     Movement: Present     General:  Alert, oriented and cooperative. Patient is in no acute distress.  Skin: Skin is warm and dry. No rash noted.   Cardiovascular: Normal heart rate noted  Respiratory: Normal respiratory effort, no problems with respiration noted  Abdomen: Soft, gravid, appropriate for gestational age. Pain/Pressure: Absent     Pelvic:  Cervical exam deferred        Extremities: Normal range of motion.  Edema: Trace  Mental Status: Normal mood and affect. Normal behavior. Normal judgment and thought content.   Assessment and Plan:  Pregnancy: G1P0 at 30628w1d  1. Pre-existing type 1 diabetes mellitus during pregnancy in third trimester -Pt reports good sugar control with pump; mostly manages herself.  [facility]inologist at [facility] Claiborne Rigg([place]) -Growth is increasing--extensive counseling about delivery.  Discussed primary c/s at 4500g.  Pt  wants to labor if possible.  Has doula.   -Continue weekly BPP or 2x week testing.    2. Supervision of high risk pregnancy, antepartum -Seeing Dr. Adrian BlackwaterStinson for back pain prn GBS at 35-36 weeks  Preterm labor symptoms and general obstetric precautions including but not limited to vaginal bleeding, contractions, leaking of fluid and fetal movement were reviewed in detail with the patient. Please refer to After Visit Summary for other counseling recommendations.  Return in about 1 week (around 03/26/2016).   Lesly DukesKelly H Takerra Lupinacci, MD

## 2016-03-25 ENCOUNTER — Encounter (HOSPITAL_COMMUNITY): Payer: Self-pay

## 2016-03-25 ENCOUNTER — Ambulatory Visit (INDEPENDENT_AMBULATORY_CARE_PROVIDER_SITE_OTHER): Payer: 59 | Admitting: Family Medicine

## 2016-03-25 ENCOUNTER — Ambulatory Visit: Payer: 59 | Admitting: Family Medicine

## 2016-03-25 ENCOUNTER — Ambulatory Visit (HOSPITAL_COMMUNITY)
Admission: RE | Admit: 2016-03-25 | Discharge: 2016-03-25 | Disposition: A | Discharge status: Home / Self Care | Payer: 59 | Source: Ambulatory Visit | Attending: Obstetrics & Gynecology | Admitting: Obstetrics & Gynecology

## 2016-03-25 VITALS — BP 134/72 | HR 100 | Wt 228.0 lb

## 2016-03-25 DIAGNOSIS — M9908 Segmental and somatic dysfunction of rib cage: Secondary | ICD-10-CM

## 2016-03-25 DIAGNOSIS — M9901 Segmental and somatic dysfunction of cervical region: Secondary | ICD-10-CM

## 2016-03-25 DIAGNOSIS — M9903 Segmental and somatic dysfunction of lumbar region: Secondary | ICD-10-CM

## 2016-03-25 DIAGNOSIS — M9902 Segmental and somatic dysfunction of thoracic region: Secondary | ICD-10-CM | POA: Diagnosis not present

## 2016-03-25 DIAGNOSIS — M9905 Segmental and somatic dysfunction of pelvic region: Secondary | ICD-10-CM

## 2016-03-25 DIAGNOSIS — Z3A34 34 weeks gestation of pregnancy: Secondary | ICD-10-CM | POA: Insufficient documentation

## 2016-03-25 DIAGNOSIS — E109 Type 1 diabetes mellitus without complications: Secondary | ICD-10-CM

## 2016-03-25 DIAGNOSIS — O24013 Pre-existing diabetes mellitus, type 1, in pregnancy, third trimester: Secondary | ICD-10-CM | POA: Diagnosis not present

## 2016-03-25 DIAGNOSIS — O26893 Other specified pregnancy related conditions, third trimester: Secondary | ICD-10-CM | POA: Diagnosis not present

## 2016-03-25 DIAGNOSIS — M549 Dorsalgia, unspecified: Secondary | ICD-10-CM

## 2016-03-25 DIAGNOSIS — O9989 Other specified diseases and conditions complicating pregnancy, childbirth and the puerperium: Secondary | ICD-10-CM

## 2016-03-25 DIAGNOSIS — M9900 Segmental and somatic dysfunction of head region: Secondary | ICD-10-CM

## 2016-03-25 DIAGNOSIS — Z23 Encounter for immunization: Secondary | ICD-10-CM | POA: Diagnosis not present

## 2016-03-25 DIAGNOSIS — O0990 Supervision of high risk pregnancy, unspecified, unspecified trimester: Secondary | ICD-10-CM

## 2016-03-25 DIAGNOSIS — O10013 Pre-existing essential hypertension complicating pregnancy, third trimester: Secondary | ICD-10-CM | POA: Insufficient documentation

## 2016-03-25 DIAGNOSIS — M9904 Segmental and somatic dysfunction of sacral region: Secondary | ICD-10-CM

## 2016-03-25 DIAGNOSIS — Z3A33 33 weeks gestation of pregnancy: Secondary | ICD-10-CM

## 2016-03-25 DIAGNOSIS — O24019 Pre-existing diabetes mellitus, type 1, in pregnancy, unspecified trimester: Secondary | ICD-10-CM

## 2016-03-25 DIAGNOSIS — O10019 Pre-existing essential hypertension complicating pregnancy, unspecified trimester: Secondary | ICD-10-CM

## 2016-03-25 DIAGNOSIS — Z362 Encounter for other antenatal screening follow-up: Secondary | ICD-10-CM

## 2016-03-25 NOTE — Addendum Note (Signed)
Encounter addended by: Vanetta Shawlarolyn H Mathieu Schloemer, RT, RVT, RDMS on: 03/25/2016  6:22 PM<BR>    Actions taken: Imaging Exam ended

## 2016-03-25 NOTE — Progress Notes (Signed)
   PRENATAL VISIT NOTE  Subjective:  Valerie Dennis is a 31 y.o. G1P0 at 6337w5d being seen today for ongoing prenatal care.  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  Patient type 1 diabetic - not able to currently print out blood sugars from meter. Over last 2 weeks, average blood sugar is 116, rnage of 55 - 150. Fasting average around 90,   Patient reports Improvement of back pain. Still has some dull aching in left lower back. Pain in right center back improved for about 1 week, then returned..  Contractions: Not present. Vag. Bleeding: None.  Movement: Present. Denies leaking of fluid.   The following portions of the patient's history were reviewed and updated as appropriate: allergies, current medications, past family history, past medical history, past social history, past surgical history and problem list. Problem list updated.  Objective:   Vitals:   03/25/16 1450  BP: 134/72  Pulse: 100  Weight: 228 lb (103.4 kg)    Fetal Status: Fetal Heart Rate (bpm): 135   Movement: Present     General:  Alert, oriented and cooperative. Patient is in no acute distress.  Skin: Skin is warm and dry. No rash noted.   Cardiovascular: Normal heart rate noted  Respiratory: Normal respiratory effort, no problems with respiration noted  Abdomen: Soft, gravid, appropriate for gestational age. Pain/Pressure: Absent     Pelvic:  Cervical exam deferred        MSK: Restriction, tenderness, tissue texture changes, and paraspinal spasm in the lumbar, thoracic, and cervical spine  Neuro: Moves all four  extremities with no focal neurological deficit  Extremities: Normal range of motion.  Edema: None  Mental Status: Normal mood and affect. Normal behavior. Normal judgment and thought content.   OSE: Head OA SRRL  Cervical C3 FSRR  Thoracic T4 FSRL, T7-8 FSRR  Rib 7-8 inhaled on right  Lumbar L5 ESRL  Sacrum R/r torsion  Pelvis Right ant innom    Assessment and Plan:  Pregnancy: G1P0 at 637w5d  1. Supervision of high risk pregnancy, antepartum FHT and FH normal.  2. Type 1 diabetes mellitus without complication (HCC) Continue insulin. BPP tomorrow.   3. Back pain affecting pregnancy in second trimester 4. Somatic dysfunction of thoracic region 5. Somatic dysfunction of rib cage region 6. Somatic dysfunction of lumbar region 7. Somatic dysfunction of sacral region 8. Somatic dysfunction of pelvis region 9. Somatic dysfunction of head region 10. Somatic dysfunction of cervical region OMT done after patient permission. Patient tolerated procedure well - experienced subjective and objective improvement.   Preterm labor symptoms and general obstetric precautions including but not limited to vaginal bleeding, contractions, leaking of fluid and fetal movement were reviewed in detail with the patient. Please refer to After Visit Summary for other counseling recommendations.  No Follow-up on file.  [name], DO

## 2016-03-31 ENCOUNTER — Ambulatory Visit (HOSPITAL_COMMUNITY)
Admission: RE | Admit: 2016-03-31 | Discharge: 2016-03-31 | Disposition: A | Discharge status: Home / Self Care | Payer: 59 | Source: Ambulatory Visit | Attending: Obstetrics & Gynecology | Admitting: Obstetrics & Gynecology

## 2016-03-31 ENCOUNTER — Other Ambulatory Visit (HOSPITAL_COMMUNITY): Payer: Self-pay | Admitting: Maternal and Fetal Medicine

## 2016-03-31 ENCOUNTER — Other Ambulatory Visit: Payer: Self-pay | Admitting: Obstetrics & Gynecology

## 2016-03-31 ENCOUNTER — Encounter (HOSPITAL_COMMUNITY): Payer: Self-pay

## 2016-03-31 DIAGNOSIS — Z3A36 36 weeks gestation of pregnancy: Secondary | ICD-10-CM

## 2016-03-31 DIAGNOSIS — Z3A33 33 weeks gestation of pregnancy: Secondary | ICD-10-CM

## 2016-03-31 DIAGNOSIS — O10019 Pre-existing essential hypertension complicating pregnancy, unspecified trimester: Secondary | ICD-10-CM

## 2016-03-31 DIAGNOSIS — O10013 Pre-existing essential hypertension complicating pregnancy, third trimester: Secondary | ICD-10-CM | POA: Insufficient documentation

## 2016-03-31 DIAGNOSIS — O24013 Pre-existing diabetes mellitus, type 1, in pregnancy, third trimester: Secondary | ICD-10-CM

## 2016-03-31 DIAGNOSIS — O24019 Pre-existing diabetes mellitus, type 1, in pregnancy, unspecified trimester: Secondary | ICD-10-CM

## 2016-03-31 DIAGNOSIS — Z3A35 35 weeks gestation of pregnancy: Secondary | ICD-10-CM | POA: Insufficient documentation

## 2016-03-31 DIAGNOSIS — Z362 Encounter for other antenatal screening follow-up: Secondary | ICD-10-CM

## 2016-03-31 DIAGNOSIS — Z3A37 37 weeks gestation of pregnancy: Secondary | ICD-10-CM

## 2016-04-01 ENCOUNTER — Encounter: Payer: Self-pay | Admitting: Obstetrics & Gynecology

## 2016-04-02 ENCOUNTER — Encounter: Payer: Self-pay | Admitting: *Deleted

## 2016-04-03 ENCOUNTER — Ambulatory Visit (INDEPENDENT_AMBULATORY_CARE_PROVIDER_SITE_OTHER): Payer: 59 | Admitting: Advanced Practice Midwife

## 2016-04-03 VITALS — BP 121/81 | HR 98 | Wt 230.0 lb

## 2016-04-03 DIAGNOSIS — O0990 Supervision of high risk pregnancy, unspecified, unspecified trimester: Secondary | ICD-10-CM

## 2016-04-03 DIAGNOSIS — E103293 Type 1 diabetes mellitus with mild nonproliferative diabetic retinopathy without macular edema, bilateral: Secondary | ICD-10-CM

## 2016-04-03 DIAGNOSIS — Z113 Encounter for screening for infections with a predominantly sexual mode of transmission: Secondary | ICD-10-CM

## 2016-04-03 DIAGNOSIS — O0993 Supervision of high risk pregnancy, unspecified, third trimester: Secondary | ICD-10-CM | POA: Diagnosis not present

## 2016-04-03 LAB — OB RESULTS CONSOLE GBS: GBS: NEGATIVE

## 2016-04-03 LAB — OB RESULTS CONSOLE GC/CHLAMYDIA: Gonorrhea: NEGATIVE

## 2016-04-03 NOTE — Progress Notes (Signed)
   PRENATAL VISIT NOTE  Subjective:  Valerie Dennis is a 31 y.o. G1P0 at 3933w0d being seen today for ongoing prenatal care.  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  Patient reports occasional contractions.  Contractions: Irritability. Vag. Bleeding: None.  Movement: Present. Denies leaking of fluid.   The following portions of the patient's history were reviewed and updated as appropriate: allergies, current medications, past family history, past medical history, past social history, past surgical history and problem list. Problem list updated.  Objective:   Vitals:   04/03/16 0813  BP: 121/81  Pulse: 98  Weight: 230 lb (104.3 kg)    Fetal Status: Fetal Heart Rate (bpm): 152   Movement: Present  Presentation: Vertex  General:  Alert, oriented and cooperative. Patient is in no acute distress.  Skin: Skin is warm and dry. No rash noted.   Cardiovascular: Normal heart rate noted  Respiratory: Normal respiratory effort, no problems with respiration noted  Abdomen: Soft, gravid, appropriate for gestational age. Pain/Pressure: Present     Pelvic:  Cervical exam deferred Dilation: Closed Effacement (%): 0    Extremities: Normal range of motion.  Edema: Trace  Mental Status: Normal mood and affect. Normal behavior. Normal judgment and thought content.   Fasting CBGs <90, average daily CBGs 113   Assessment and Plan:  Pregnancy: G1P0 at 6033w0d  1. Supervision of high risk pregnancy in third trimester  - Culture, Grp B Strep w/Rflx Suscept - Urine cytology ancillary only  2.  Supervision of high risk pregnancy, antepartum   3. 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 Continue weekly BPPs for antenatal testing - Discussed that if EFW >4500 we would recommend primary C/S and that if pt labors we would watch labor curve and progress of 2nd stage very closely 2/2 increased risk of shoulder dystocia.   4. CHTN--No meds, no evidence of SIP - CTO for pre-E - Pre-E precautions  Preterm labor symptoms and general obstetric precautions including but not limited to vaginal bleeding, contractions, leaking of fluid and fetal movement were reviewed in detail with the patient. Please refer to After Visit Summary for other counseling recommendations.  Continue antenatal testing   Dorathy KinsmanVirginia Jesus Poplin, CNM

## 2016-04-05 LAB — CULTURE, STREPTOCOCCUS GRP B W/SUSCEPT

## 2016-04-06 ENCOUNTER — Encounter (HOSPITAL_COMMUNITY): Payer: Self-pay

## 2016-04-06 ENCOUNTER — Encounter: Payer: 59 | Admitting: Obstetrics & Gynecology

## 2016-04-06 ENCOUNTER — Telehealth: Payer: Self-pay | Admitting: *Deleted

## 2016-04-06 ENCOUNTER — Inpatient Hospital Stay (HOSPITAL_COMMUNITY): Payer: 59

## 2016-04-06 ENCOUNTER — Encounter: Payer: 59 | Admitting: Certified Nurse Midwife

## 2016-04-06 ENCOUNTER — Inpatient Hospital Stay (HOSPITAL_COMMUNITY)
Admission: AD | Admit: 2016-04-06 | Discharge: 2016-04-06 | Disposition: A | Discharge status: Home / Self Care | Payer: 59 | Source: Ambulatory Visit | Attending: Obstetrics and Gynecology | Admitting: Obstetrics and Gynecology

## 2016-04-06 DIAGNOSIS — O24013 Pre-existing diabetes mellitus, type 1, in pregnancy, third trimester: Secondary | ICD-10-CM | POA: Insufficient documentation

## 2016-04-06 DIAGNOSIS — E109 Type 1 diabetes mellitus without complications: Secondary | ICD-10-CM | POA: Insufficient documentation

## 2016-04-06 DIAGNOSIS — O24113 Pre-existing diabetes mellitus, type 2, in pregnancy, third trimester: Secondary | ICD-10-CM | POA: Diagnosis not present

## 2016-04-06 DIAGNOSIS — Z880 Allergy status to penicillin: Secondary | ICD-10-CM | POA: Diagnosis not present

## 2016-04-06 DIAGNOSIS — Z3A36 36 weeks gestation of pregnancy: Secondary | ICD-10-CM | POA: Insufficient documentation

## 2016-04-06 DIAGNOSIS — O99810 Abnormal glucose complicating pregnancy: Secondary | ICD-10-CM

## 2016-04-06 DIAGNOSIS — Z3689 Encounter for other specified antenatal screening: Secondary | ICD-10-CM

## 2016-04-06 DIAGNOSIS — Z794 Long term (current) use of insulin: Secondary | ICD-10-CM | POA: Diagnosis not present

## 2016-04-06 DIAGNOSIS — O288 Other abnormal findings on antenatal screening of mother: Secondary | ICD-10-CM

## 2016-04-06 DIAGNOSIS — O368130 Decreased fetal movements, third trimester, not applicable or unspecified: Secondary | ICD-10-CM | POA: Diagnosis not present

## 2016-04-06 DIAGNOSIS — T85694A Other mechanical complication of insulin pump, initial encounter: Secondary | ICD-10-CM

## 2016-04-06 LAB — URINALYSIS, ROUTINE W REFLEX MICROSCOPIC
Bacteria, UA: NONE SEEN
Bilirubin Urine: NEGATIVE
Glucose, UA: >=500 mg/dL — AB
Hgb urine dipstick: NEGATIVE
Ketones, ur: NEGATIVE mg/dL
Leukocytes, UA: NEGATIVE
Nitrite: NEGATIVE
Protein, ur: NEGATIVE mg/dL
Specific Gravity, Urine: 1.011 (ref 1.005–1.030)
WBC, UA: NONE SEEN WBC/hpf (ref 0–5)
pH: 6 (ref 5.0–8.0)

## 2016-04-06 LAB — COMPREHENSIVE METABOLIC PANEL
ALT: 15 U/L (ref 14–54)
AST: 20 U/L (ref 15–41)
Albumin: 2.7 g/dL — ABNORMAL LOW (ref 3.5–5.0)
Alkaline Phosphatase: 124 U/L (ref 38–126)
Anion gap: 7 (ref 5–15)
BUN: 11 mg/dL (ref 6–20)
CO2: 24 mmol/L (ref 22–32)
Calcium: 8.8 mg/dL — ABNORMAL LOW (ref 8.9–10.3)
Chloride: 106 mmol/L (ref 101–111)
Creatinine, Ser: 0.59 mg/dL (ref 0.44–1.00)
GFR calc Af Amer: >60 mL/min (ref >60)
GFR calc non Af Amer: >60 mL/min (ref >60)
Glucose, Bld: 55 mg/dL — ABNORMAL LOW (ref 65–99)
Potassium: 3.5 mmol/L (ref 3.5–5.1)
Sodium: 137 mmol/L (ref 135–145)
Total Bilirubin: 0.2 mg/dL — ABNORMAL LOW (ref 0.3–1.2)
Total Protein: 6.7 g/dL (ref 6.5–8.1)

## 2016-04-06 LAB — CBC
HCT: 38.6 % (ref 36.0–46.0)
Hemoglobin: 13.2 g/dL (ref 12.0–15.0)
MCH: 30.2 pg (ref 26.0–34.0)
MCHC: 34.2 g/dL (ref 30.0–36.0)
MCV: 88.3 fL (ref 78.0–100.0)
Platelets: 164 10*3/uL (ref 150–400)
RBC: 4.37 MIL/uL (ref 3.87–5.11)
RDW: 13.5 % (ref 11.5–15.5)
WBC: 10.4 10*3/uL (ref 4.0–10.5)

## 2016-04-06 LAB — PROTEIN / CREATININE RATIO, URINE
Creatinine, Urine: 32 mg/dL
Total Protein, Urine: <6 mg/dL

## 2016-04-06 LAB — URINE CYTOLOGY ANCILLARY ONLY
Chlamydia: NEGATIVE
Neisseria Gonorrhea: NEGATIVE

## 2016-04-06 LAB — GLUCOSE, CAPILLARY: Glucose-Capillary: 96 mg/dL (ref 65–99)

## 2016-04-06 MED ORDER — SODIUM CHLORIDE 0.9 % IV BOLUS (SEPSIS)
1000.0000 mL | Freq: Once | INTRAVENOUS | Status: AC
  Administered 2016-04-06: 1000 mL via INTRAVENOUS

## 2016-04-06 NOTE — MAU Provider Note (Signed)
Chief Complaint:  fetal tach   None     HPI: Valerie Dennis is a 31 y.o. G1P0 at 5076w3d with hx Type 1 DM with mild retinopathy and HTN prior to pregnancy who presents to maternity admissions reporting her blood sugar has been well controlled during pregnancy with her insulin pump but this morning her pump alerted her of a high glucose of 270.  She bolused using her insulin pump with Novolog several times starting with 5 U, then increasing up to 25 U but her BS continued to climb to a high of 313 at 10:30 am.  At 9:30 am, prior to the highest BS, she gave a Novolog insulin injection of 25 U SQ.  She gave a second injection at 10:30, after the BS of 313.  She reports her high BS was associated with decreased fetal movement this morning.  She called the office and went to [place] office this morning and FHR was 174 so was sent to MAU for further evaluation.  She denies any pain, n/v, h/a, or fever/chills. She is feeling fetal movement now in MAU but reports it still feels less than usual.  She denies cramping/contractions currently but reports 2-3 episodes of painful but irregular contractions this week that resolved spontaneously.      HPI  Past Medical History: Past Medical History:  Diagnosis Date  . Diabetes mellitus without complication (HCC)   . Hypertension     Past obstetric history: OB History  Gravida Para Term Preterm AB Living  1         0  SAB TAB Ectopic Multiple Live Births               # Outcome Date GA Lbr Len/2nd Weight Sex Delivery Anes PTL Lv  1 Current               Past Surgical History: Past Surgical History:  Procedure Laterality Date  . CAUTERY OF TURBINATES  2003  . DE QUERVAIN'S RELEASE  2016  . WISDOM TOOTH EXTRACTION      Family History: Family History  Problem Relation Age of Onset  . Hyperlipidemia Mother   . Rheum arthritis Mother   . Heart disease Father   . Hyperlipidemia Father   . Hypertension Father   . Stroke Father   .  Hypertension Sister   . Heart disease Maternal Grandmother   . Heart disease Maternal Grandfather   . Heart disease Paternal Grandfather   . Hypertension Paternal Grandfather   . Diabetes Paternal Grandfather     Social History: Social History  Substance Use Topics  . Smoking status: Never Smoker  . Smokeless tobacco: Never Used  . Alcohol use 0.0 oz/week     Comment: Socially before pregnant    Allergies:  Allergies  Allergen Reactions  . Amoxicillin Rash    Has patient had a PCN reaction causing immediate rash, facial/tongue/throat swelling, SOB or lightheadedness with hypotension: no Has patient had a PCN reaction causing severe rash involving mucus membranes or skin necrosis: yes Has patient had a PCN reaction that required hospitalization no Has patient had a PCN reaction occurring within the last 10 years: no If all of the above answers are "NO", then may proceed with Cephalosporin use.     Meds:  Prescriptions Prior to Admission  Medication Sig Dispense Refill Last Dose  . Insulin Human (INSULIN PUMP) SOLN Inject 1 each into the skin continuous. Humalog insulin in pump, up to 150 units per day  04/06/2016 at Unknown time  . mometasone (NASONEX) 50 MCG/ACT nasal spray Place 2 sprays into the nose daily.   04/05/2016 at Unknown time  . Prenatal Vit-Fe Fumarate-FA (MULTIVITAMIN-PRENATAL) 27-0.8 MG TABS tablet Take 1 tablet by mouth daily at 12 noon.   04/05/2016 at Unknown time  . BAYER CONTOUR NEXT TEST test strip   11 Taking  . insulin lispro (HUMALOG) 100 UNIT/ML KiwkPen as needed   rescue    ROS:  Review of Systems  Constitutional: Negative for chills, fatigue and fever.  Eyes: Negative for visual disturbance.  Respiratory: Negative for shortness of breath.   Cardiovascular: Negative for chest pain.  Gastrointestinal: Negative for abdominal pain, nausea and vomiting.  Genitourinary: Negative for difficulty urinating, dysuria, flank pain, pelvic pain, vaginal  bleeding, vaginal discharge and vaginal pain.  Neurological: Negative for dizziness and headaches.  Psychiatric/Behavioral: Negative.      I have reviewed patient's Past Medical Hx, Surgical Hx, Family Hx, Social Hx, medications and allergies.   Physical Exam   Patient Vitals for the past 24 hrs:  BP Temp Temp src Pulse Resp SpO2  04/06/16 1305 127/68 - - 114 - 95 %  04/06/16 1220 125/88 - - (!) 136 - 98 %  04/06/16 1210 (!) 136/108 - - (!) 135 - 97 %  04/06/16 1209 (!) 136/108 98.1 F (36.7 C) Oral (!) 140 18 98 %   Constitutional: Well-developed, well-nourished female in no acute distress.  Cardiovascular: normal rate Respiratory: normal effort GI: Abd soft, non-tender, gravid appropriate for gestational age.  MS: Extremities nontender, no edema, normal ROM Neurologic: Alert and oriented x 4.  GU: Neg CVAT.     FHT:  Baseline 150 , moderate variability, accelerations present, variable Decelerations x3 with 1 hour of reactive NST following Contractions: None on toco or to palpation   Labs: Results for orders placed or performed during the hospital encounter of 04/06/16 (from the past 24 hour(s))  Urinalysis, Routine w reflex microscopic     Status: Abnormal   Collection Time: 04/06/16 11:50 AM  Result Value Ref Range   Color, Urine STRAW (A) YELLOW   APPearance CLEAR CLEAR   Specific Gravity, Urine 1.011 1.005 - 1.030   pH 6.0 5.0 - 8.0   Glucose, UA >=500 (A) NEGATIVE mg/dL   Hgb urine dipstick NEGATIVE NEGATIVE   Bilirubin Urine NEGATIVE NEGATIVE   Ketones, ur NEGATIVE NEGATIVE mg/dL   Protein, ur NEGATIVE NEGATIVE mg/dL   Nitrite NEGATIVE NEGATIVE   Leukocytes, UA NEGATIVE NEGATIVE   RBC / HPF 0-5 0 - 5 RBC/hpf   WBC, UA NONE SEEN 0 - 5 WBC/hpf   Bacteria, UA NONE SEEN NONE SEEN   Squamous Epithelial / LPF 0-5 (A) NONE SEEN   Mucous PRESENT   Protein / creatinine ratio, urine     Status: None   Collection Time: 04/06/16 11:50 AM  Result Value Ref Range    Creatinine, Urine 32.00 mg/dL   Total Protein, Urine <6 mg/dL   Protein Creatinine Ratio        0.00 - 0.15 mg/mg[Cre]  Glucose, capillary     Status: None   Collection Time: 04/06/16 12:52 PM  Result Value Ref Range   Glucose-Capillary 96 65 - 99 mg/dL  CBC     Status: None   Collection Time: 04/06/16  1:46 PM  Result Value Ref Range   WBC 10.4 4.0 - 10.5 K/uL   RBC 4.37 3.87 - 5.11 MIL/uL   Hemoglobin 13.2 12.0 -  15.0 g/dL   HCT 69.6 29.5 - 28.4 %   MCV 88.3 78.0 - 100.0 fL   MCH 30.2 26.0 - 34.0 pg   MCHC 34.2 30.0 - 36.0 g/dL   RDW 13.2 44.0 - 10.2 %   Platelets 164 150 - 400 K/uL  Comprehensive metabolic panel     Status: Abnormal   Collection Time: 04/06/16  1:46 PM  Result Value Ref Range   Sodium 137 135 - 145 mmol/L   Potassium 3.5 3.5 - 5.1 mmol/L   Chloride 106 101 - 111 mmol/L   CO2 24 22 - 32 mmol/L   Glucose, Bld 55 (L) 65 - 99 mg/dL   BUN 11 6 - 20 mg/dL   Creatinine, Ser 7.25 0.44 - 1.00 mg/dL   Calcium 8.8 (L) 8.9 - 10.3 mg/dL   Total Protein 6.7 6.5 - 8.1 g/dL   Albumin 2.7 (L) 3.5 - 5.0 g/dL   AST 20 15 - 41 U/L   ALT 15 14 - 54 U/L   Alkaline Phosphatase 124 38 - 126 U/L   Total Bilirubin 0.2 (L) 0.3 - 1.2 mg/dL   GFR calc non Af Amer >60 >60 mL/min   GFR calc Af Amer >60 >60 mL/min   Anion gap 7 5 - 15   O/POS/-- (08/14 1642)  Imaging:  [place] Mfm Fetal Bpp Wo Non Stress  Result Date: 04/06/2016 ----------------------------------------------------------------------  OBSTETRICS REPORT                      (Signed Final 04/06/2016 03:15 pm) ---------------------------------------------------------------------- Patient Info  ID #:       [id number]                         D.O.B.:   1985-10-08 (31 yrs)  Name:       Valerie Dennis               Visit Date:  04/06/2016 03:02 pm ---------------------------------------------------------------------- Performed By  Performed By:     Emeline Darling BS,      Secondary Phy.:   [facility]                                                             [facility]                                                             [facility]  Attending:        Charlsie Merles MD         Address:  [address] [place], [place]  Referred By:      Elie Goody.:    MAU Nursing-                    Erin Fulling MD                                                             MAU/Triage  Ref. Address:     [address]       Location:         [facility] [place],                    [place] [address] ---------------------------------------------------------------------- Orders   #  Description                                 Code   1  [place] MFM FETAL BPP WO NON STRESS              [phone]  ----------------------------------------------------------------------   #  Ordered By               Order #        Accession #    Episode #   1  Collene Gobble-           [id number]      [id number]     [id number]      KIRBY  ---------------------------------------------------------------------- Indications   [redacted] weeks gestation of pregnancy                Z3A.36   Pre-existing diabetes, type 1, in pregnancy,   O24.013   third trimester (insulin pump); normal fetal   ECHO   Hypertension - Chronic/Pre-existing -no        O10.019   meds  ---------------------------------------------------------------------- OB History  Blood Type:            Height:  5'4"   Weight (lb):  230      BMI:   39.48  Gravidity:    1         Term:   0        Prem:   0        SAB:   0  TOP:          0       Ectopic:  0        Living: 0 ---------------------------------------------------------------------- Fetal Evaluation  Num Of Fetuses:     1  Fetal Heart         133  Rate(bpm):  Cardiac Activity:   Observed  Presentation:       Cephalic  Amniotic Fluid  AFI FV:       Subjectively within normal limits  AFI Sum(cm)     %Tile       Largest Pocket(cm)  15.29           57          7.52  RUQ(cm)       RLQ(cm)       LUQ(cm)        LLQ(cm)  7.52          2.45          2.22           3.1  Comment:    Fetal HRT ranged from 123 bpm to 148 bpm. ---------------------------------------------------------------------- Biophysical Evaluation  Amniotic F.V:   Pocket => 2 cm two         F. Tone:        Observed                  planes  F. Movement:    Observed                   Score:          8/8  F. Breathing:   Observed ---------------------------------------------------------------------- Gestational Age  LMP:           36w 3d       Date:   07/26/15                 EDD:   05/01/16  Best:          Stevie Kern 3d    Det. By:   LMP  (07/26/15)          EDD:   05/01/16 ---------------------------------------------------------------------- Impression  IUP at  36+ 3 weeks with type 1 DM and CHTN. Here for BPP  Normal amniotic fluid volume  BPP 8/8 ---------------------------------------------------------------------- Recommendations  Continue current antepartum testing regimen ----------------------------------------------------------------------                 Charlsie Merles, MD Electronically Signed Final Report   04/06/2016 03:15 pm ----------------------------------------------------------------------    MAU Course/MDM: I have ordered labs and reviewed results.  NST reviewed, variables initially that resolved. BPP ordered and 8/8. Likely problem with insulin pump because blood sugar rose during the night then did not respond to insulin pump doses but did respond to SQ doses of Novolog.  Pt to change her insulin pump site and monitor blood sugars more closely than usual for next 24 hours.  Blood sugar stable in MAU after drop to 42, came up to 60s and remained there until discharge.  Pt asymptomatic. Consult Dr Jolayne Panther with presentation, exam findings and test results.  Pt had BPP today so  cancelled BPP tomorrow.  Pt to have BPP with growth next week. Reviewed reasons to return to MAU. Pt stable at time of discharge.   Assessment: 1. NST (non-stress test) reactive   2. Pre-existing type 1 diabetes mellitus during pregnancy in third trimester   3. NST (non-stress test) nonreactive   4. Hyperglycemia during pregnancy   5. Other mechanical complication of insulin pump, initial encounter     Plan: Discharge home Labor precautions and fetal kick counts Follow-up Information    [facility] [place] Follow up.   Specialty:  Obstetrics and Gynecology Why:  As scheduled, return to MAU as  needed for emergencies Contact information: [address] [place] [address] [phone]         Allergies as of 04/06/2016      Reactions   Amoxicillin Rash   Has patient had a PCN reaction causing immediate rash, facial/tongue/throat swelling, SOB or lightheadedness with hypotension: no Has patient had a PCN reaction causing severe rash involving mucus membranes or skin necrosis: yes Has patient had a PCN reaction that required hospitalization no Has patient had a PCN reaction occurring within the last 10 years: no If all of the above answers are "NO", then may proceed with Cephalosporin use.      Medication List    TAKE these medications   BAYER CONTOUR NEXT TEST test strip Generic drug:  glucose blood   insulin lispro 100 UNIT/ML KiwkPen Commonly known as:  HUMALOG as needed   insulin pump Soln Inject 1 each into the skin continuous. Humalog insulin in pump, up to 150 units per day   mometasone 50 MCG/ACT nasal spray Commonly known as:  NASONEX Place 2 sprays into the nose daily.   multivitamin-prenatal 27-0.8 MG Tabs tablet Take 1 tablet by mouth daily at 12 noon.       Sharen Counter Certified Nurse-Midwife 04/06/2016 4:06 PM

## 2016-04-06 NOTE — Telephone Encounter (Signed)
Got a call from pt's husband saying that pt was running high 200's on insulin pump.  She has done bolus several times and given herself SQ insulin today trying to get the levels. Down.  He states they are very concerned and that she can't remember if she has felt any fetal movement this morning.  Recommended that thaey come @ 1:15 to see Dr Marice Potterove.  Husband says he doesn't want to wait that long.  He would like patient to come in for at least to [facility].  Pt here and bedside U/S done which shows FHT of 173 BPM and large pockets of amniotic fluid.  Baby is active in utero.  Pt states that now her blood sugars are running 300.  I strongly suggested that they go to MAU right now and and be evaluated.  Pt and husband agree to go right now.

## 2016-04-06 NOTE — MAU Note (Signed)
Pt was seen in office for elevated blood glucose levels and was monitored in the office. Fetal HR in office 174. She was sent here for more monitoring.

## 2016-04-06 NOTE — MAU Note (Signed)
Urine in lab 

## 2016-04-06 NOTE — MAU Note (Signed)
Sent from office with fetal tach, brought directly to rm. Denies pain, bleeding or leaking.

## 2016-04-07 ENCOUNTER — Ambulatory Visit (HOSPITAL_COMMUNITY): Admission: RE | Admit: 2016-04-07 | Payer: 59 | Source: Ambulatory Visit

## 2016-04-07 ENCOUNTER — Encounter: Payer: 59 | Admitting: Obstetrics & Gynecology

## 2016-04-09 MED FILL — CONTOUR NEXT STRIPS: 38 days supply | Qty: 300 | Fill #2

## 2016-04-10 ENCOUNTER — Ambulatory Visit (INDEPENDENT_AMBULATORY_CARE_PROVIDER_SITE_OTHER): Payer: 59 | Admitting: Family Medicine

## 2016-04-10 VITALS — BP 133/85 | HR 94 | Wt 231.0 lb

## 2016-04-10 DIAGNOSIS — M549 Dorsalgia, unspecified: Secondary | ICD-10-CM

## 2016-04-10 DIAGNOSIS — M9902 Segmental and somatic dysfunction of thoracic region: Secondary | ICD-10-CM

## 2016-04-10 DIAGNOSIS — O24013 Pre-existing diabetes mellitus, type 1, in pregnancy, third trimester: Secondary | ICD-10-CM | POA: Diagnosis not present

## 2016-04-10 DIAGNOSIS — M9905 Segmental and somatic dysfunction of pelvic region: Secondary | ICD-10-CM

## 2016-04-10 DIAGNOSIS — M9904 Segmental and somatic dysfunction of sacral region: Secondary | ICD-10-CM

## 2016-04-10 DIAGNOSIS — M9908 Segmental and somatic dysfunction of rib cage: Secondary | ICD-10-CM

## 2016-04-10 DIAGNOSIS — E103293 Type 1 diabetes mellitus with mild nonproliferative diabetic retinopathy without macular edema, bilateral: Secondary | ICD-10-CM | POA: Diagnosis not present

## 2016-04-10 DIAGNOSIS — M9900 Segmental and somatic dysfunction of head region: Secondary | ICD-10-CM

## 2016-04-10 DIAGNOSIS — O26893 Other specified pregnancy related conditions, third trimester: Secondary | ICD-10-CM

## 2016-04-10 DIAGNOSIS — M9903 Segmental and somatic dysfunction of lumbar region: Secondary | ICD-10-CM

## 2016-04-10 DIAGNOSIS — O9989 Other specified diseases and conditions complicating pregnancy, childbirth and the puerperium: Secondary | ICD-10-CM

## 2016-04-10 DIAGNOSIS — M9901 Segmental and somatic dysfunction of cervical region: Secondary | ICD-10-CM

## 2016-04-10 DIAGNOSIS — O0993 Supervision of high risk pregnancy, unspecified, third trimester: Secondary | ICD-10-CM

## 2016-04-10 NOTE — Progress Notes (Signed)
   PRENATAL VISIT NOTE  Subjective:  Valerie Dennis is a 31 y.o. G1P0 at 5826w0d being seen today for ongoing prenatal care.  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  Patient reports backache. Improving - middle of back pain improved for about 10 days, then started to get worse again. Vag. Bleeding: None.  Movement: Present. Denies leaking of fluid.   Reports blood sugars controlled. Did have episode of really high blood sugars. She took several doses of insulin, without it coming down. Went to [facility] and eventually, BS got better  The following portions of the patient's history were reviewed and updated as appropriate: allergies, current medications, past family history, past medical history, past social history, past surgical history and problem list. Problem list updated.  Objective:   Vitals:   04/10/16 0817  BP: 133/85  Pulse: 94  Weight: 231 lb (104.8 kg)    Fetal Status:     Movement: Present     General:  Alert, oriented and cooperative. Patient is in no acute distress.  Skin: Skin is warm and dry. No rash noted.   Cardiovascular: Normal heart rate noted  Respiratory: Normal respiratory effort, no problems with respiration noted  Abdomen: Soft, gravid, appropriate for gestational age. Pain/Pressure: Present     Pelvic:  Cervical exam deferred        MSK: Restriction, tenderness, tissue texture changes, and paraspinal spasm in the lumbar and thoracic and cervical spine  Neuro: Moves all four extremities with no focal neurological deficit  Extremities: Normal range of motion.   Edema: Trace  Mental Status: Normal mood and affect. Normal behavior. Normal judgment and thought content.   OSE: Head OA SRRL  Cervical C3 FSRR  Thoracic T4 FSRL, T7-8 FSRR  Rib 7-8 inhaled on right  Lumbar L5 ESRL  Sacrum R/r torsion  Pelvis Right ant innom     Assessment and Plan:  Pregnancy: G1P0 at 6226w0d  1. Supervision of high risk pregnancy in third trimester FH normal  2. 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 Continue insulin. BPPs reviewed.  3. Back pain affecting pregnancy in second trimester 4. Somatic dysfunction of thoracic region 5. Somatic dysfunction of rib cage region 6. Somatic dysfunction of lumbar region 7. Somatic dysfunction of sacral region 8. Somatic dysfunction of pelvis region 9. Somatic dysfunction of head region 10. Somatic dysfunction of cervical region OMT done after patient permission. HVLA technique utilized. Patient tolerated procedure well.    Term labor symptoms and general obstetric precautions including but not limited to vaginal bleeding, contractions, leaking of fluid and fetal movement were reviewed in detail with the patient. Please refer to After Visit Summary for other counseling recommendations.  No Follow-up on file.  Levie HeritageJacob J Stinson, DO

## 2016-04-14 ENCOUNTER — Ambulatory Visit (HOSPITAL_COMMUNITY)
Admission: RE | Admit: 2016-04-14 | Discharge: 2016-04-14 | Disposition: A | Discharge status: Home / Self Care | Payer: 59 | Source: Ambulatory Visit | Attending: Obstetrics & Gynecology | Admitting: Obstetrics & Gynecology

## 2016-04-14 ENCOUNTER — Encounter (HOSPITAL_COMMUNITY): Payer: Self-pay

## 2016-04-14 DIAGNOSIS — O10013 Pre-existing essential hypertension complicating pregnancy, third trimester: Secondary | ICD-10-CM | POA: Diagnosis not present

## 2016-04-14 DIAGNOSIS — Z3A37 37 weeks gestation of pregnancy: Secondary | ICD-10-CM | POA: Diagnosis not present

## 2016-04-14 DIAGNOSIS — Z3A36 36 weeks gestation of pregnancy: Secondary | ICD-10-CM

## 2016-04-14 DIAGNOSIS — O24013 Pre-existing diabetes mellitus, type 1, in pregnancy, third trimester: Secondary | ICD-10-CM | POA: Diagnosis not present

## 2016-04-16 MED FILL — HUMALOG 100 UNITS/ML KWIKPE: 100 | 30 days supply | Qty: 45 | Fill #1

## 2016-04-17 ENCOUNTER — Ambulatory Visit (HOSPITAL_COMMUNITY)
Admission: RE | Admit: 2016-04-17 | Discharge: 2016-04-17 | Disposition: A | Discharge status: Home / Self Care | Payer: 59 | Source: Ambulatory Visit | Attending: Pediatrics | Admitting: Pediatrics

## 2016-04-17 ENCOUNTER — Ambulatory Visit (INDEPENDENT_AMBULATORY_CARE_PROVIDER_SITE_OTHER): Payer: 59 | Admitting: Advanced Practice Midwife

## 2016-04-17 VITALS — BP 124/71 | HR 94 | Wt 234.0 lb

## 2016-04-17 DIAGNOSIS — E108 Type 1 diabetes mellitus with unspecified complications: Secondary | ICD-10-CM | POA: Diagnosis not present

## 2016-04-17 DIAGNOSIS — E103293 Type 1 diabetes mellitus with mild nonproliferative diabetic retinopathy without macular edema, bilateral: Secondary | ICD-10-CM

## 2016-04-17 DIAGNOSIS — O0990 Supervision of high risk pregnancy, unspecified, unspecified trimester: Secondary | ICD-10-CM

## 2016-04-17 DIAGNOSIS — O24013 Pre-existing diabetes mellitus, type 1, in pregnancy, third trimester: Secondary | ICD-10-CM

## 2016-04-17 NOTE — Lactation Note (Signed)
Lactation Consult  Mother's reason for visit: to learn about hand expression Visit Type:  Lactation Consult:   Lactation Consultant:  Alfred LevinsLee, Edwyna Dangerfield Anne  ________________________________________________________________________    ________________________________________________________________________  Mother's Name: Valerie ButtStephanie Dennis    _______________________________________________________________ _______________________________________________________________________

## 2016-04-17 NOTE — Patient Instructions (Signed)
Breastfeeding Deciding to breastfeed is one of the best choices you can make for you and your baby. A change in hormones during pregnancy causes your breast tissue to grow and increases the number and size of your milk ducts. These hormones also allow proteins, sugars, and fats from your blood supply to make breast milk in your milk-producing glands. Hormones prevent breast milk from being released before your baby is born as well as prompt milk flow after birth. Once breastfeeding has begun, thoughts of your baby, as well as his or her sucking or crying, can stimulate the release of milk from your milk-producing glands. Benefits of breastfeeding For Your Baby  Your first milk (colostrum) helps your baby's digestive system function better.  There are antibodies in your milk that help your baby fight off infections.  Your baby has a lower incidence of asthma, allergies, and sudden infant death syndrome.  The nutrients in breast milk are better for your baby than infant formulas and are designed uniquely for your baby's needs.  Breast milk improves your baby's brain development.  Your baby is less likely to develop other conditions, such as childhood obesity, asthma, or type 2 diabetes mellitus.  For You  Breastfeeding helps to create a very special bond between you and your baby.  Breastfeeding is convenient. Breast milk is always available at the correct temperature and costs nothing.  Breastfeeding helps to burn calories and helps you lose the weight gained during pregnancy.  Breastfeeding makes your uterus contract to its prepregnancy size faster and slows bleeding (lochia) after you give birth.  Breastfeeding helps to lower your risk of developing type 2 diabetes mellitus, osteoporosis, and breast or ovarian cancer later in life.  Signs that your baby is hungry Early Signs of Hunger  Increased alertness or activity.  Stretching.  Movement of the head from side to  side.  Movement of the head and opening of the mouth when the corner of the mouth or cheek is stroked (rooting).  Increased sucking sounds, smacking lips, cooing, sighing, or squeaking.  Hand-to-mouth movements.  Increased sucking of fingers or hands.  Late Signs of Hunger  Fussing.  Intermittent crying.  Extreme Signs of Hunger Signs of extreme hunger will require calming and consoling before your baby will be able to breastfeed successfully. Do not wait for the following signs of extreme hunger to occur before you initiate breastfeeding:  Restlessness.  A loud, strong cry.  Screaming.  Breastfeeding basics Breastfeeding Initiation  Find a comfortable place to sit or lie down, with your neck and back well supported.  Place a pillow or rolled up blanket under your baby to bring him or her to the level of your breast (if you are seated). Nursing pillows are specially designed to help support your arms and your baby while you breastfeed.  Make sure that your baby's abdomen is facing your abdomen.  Gently massage your breast. With your fingertips, massage from your chest wall toward your nipple in a circular motion. This encourages milk flow. You may need to continue this action during the feeding if your milk flows slowly.  Support your breast with 4 fingers underneath and your thumb above your nipple. Make sure your fingers are well away from your nipple and your baby's mouth.  Stroke your baby's lips gently with your finger or nipple.  When your baby's mouth is open wide enough, quickly bring your baby to your breast, placing your entire nipple and as much of the colored area   around your nipple (areola) as possible into your baby's mouth. ? More areola should be visible above your baby's upper lip than below the lower lip. ? Your baby's tongue should be between his or her lower gum and your breast.  Ensure that your baby's mouth is correctly positioned around your nipple  (latched). Your baby's lips should create a seal on your breast and be turned out (everted).  It is common for your baby to suck about 2-3 minutes in order to start the flow of breast milk.  Latching Teaching your baby how to latch on to your breast properly is very important. An improper latch can cause nipple pain and decreased milk supply for you and poor weight gain in your baby. Also, if your baby is not latched onto your nipple properly, he or she may swallow some air during feeding. This can make your baby fussy. Burping your baby when you switch breasts during the feeding can help to get rid of the air. However, teaching your baby to latch on properly is still the best way to prevent fussiness from swallowing air while breastfeeding. Signs that your baby has successfully latched on to your nipple:  Silent tugging or silent sucking, without causing you pain.  Swallowing heard between every 3-4 sucks.  Muscle movement above and in front of his or her ears while sucking.  Signs that your baby has not successfully latched on to nipple:  Sucking sounds or smacking sounds from your baby while breastfeeding.  Nipple pain.  If you think your baby has not latched on correctly, slip your finger into the corner of your baby's mouth to break the suction and place it between your baby's gums. Attempt breastfeeding initiation again. Signs of Successful Breastfeeding Signs from your baby:  A gradual decrease in the number of sucks or complete cessation of sucking.  Falling asleep.  Relaxation of his or her body.  Retention of a small amount of milk in his or her mouth.  Letting go of your breast by himself or herself.  Signs from you:  Breasts that have increased in firmness, weight, and size 1-3 hours after feeding.  Breasts that are softer immediately after breastfeeding.  Increased milk volume, as well as a change in milk consistency and color by the fifth day of  breastfeeding.  Nipples that are not sore, cracked, or bleeding.  Signs That Your Baby is Getting Enough Milk  Wetting at least 1-2 diapers during the first 24 hours after birth.  Wetting at least 5-6 diapers every 24 hours for the first week after birth. The urine should be clear or pale yellow by 5 days after birth.  Wetting 6-8 diapers every 24 hours as your baby continues to grow and develop.  At least 3 stools in a 24-hour period by age 5 days. The stool should be soft and yellow.  At least 3 stools in a 24-hour period by age 7 days. The stool should be seedy and yellow.  No loss of weight greater than 10% of birth weight during the first 3 days of age.  Average weight gain of 4-7 ounces (113-198 g) per week after age 4 days.  Consistent daily weight gain by age 5 days, without weight loss after the age of 2 weeks.  After a feeding, your baby may spit up a small amount. This is common. Breastfeeding frequency and duration Frequent feeding will help you make more milk and can prevent sore nipples and breast engorgement. Breastfeed when   you feel the need to reduce the fullness of your breasts or when your baby shows signs of hunger. This is called "breastfeeding on demand." Avoid introducing a pacifier to your baby while you are working to establish breastfeeding (the first 4-6 weeks after your baby is born). After this time you may choose to use a pacifier. Research has shown that pacifier use during the first year of a baby's life decreases the risk of sudden infant death syndrome (SIDS). Allow your baby to feed on each breast as long as he or she wants. Breastfeed until your baby is finished feeding. When your baby unlatches or falls asleep while feeding from the first breast, offer the second breast. Because newborns are often sleepy in the first few weeks of life, you may need to awaken your baby to get him or her to feed. Breastfeeding times will vary from baby to baby. However,  the following rules can serve as a guide to help you ensure that your baby is properly fed:  Newborns (babies 4 weeks of age or younger) may breastfeed every 1-3 hours.  Newborns should not go longer than 3 hours during the day or 5 hours during the night without breastfeeding.  You should breastfeed your baby a minimum of 8 times in a 24-hour period until you begin to introduce solid foods to your baby at around 6 months of age.  Breast milk pumping Pumping and storing breast milk allows you to ensure that your baby is exclusively fed your breast milk, even at times when you are unable to breastfeed. This is especially important if you are going back to work while you are still breastfeeding or when you are not able to be present during feedings. Your lactation consultant can give you guidelines on how long it is safe to store breast milk. A breast pump is a machine that allows you to pump milk from your breast into a sterile bottle. The pumped breast milk can then be stored in a refrigerator or freezer. Some breast pumps are operated by hand, while others use electricity. Ask your lactation consultant which type will work best for you. Breast pumps can be purchased, but some hospitals and breastfeeding support groups lease breast pumps on a monthly basis. A lactation consultant can teach you how to hand express breast milk, if you prefer not to use a pump. Caring for your breasts while you breastfeed Nipples can become dry, cracked, and sore while breastfeeding. The following recommendations can help keep your breasts moisturized and healthy:  Avoid using soap on your nipples.  Wear a supportive bra. Although not required, special nursing bras and tank tops are designed to allow access to your breasts for breastfeeding without taking off your entire bra or top. Avoid wearing underwire-style bras or extremely tight bras.  Air dry your nipples for 3-4minutes after each feeding.  Use only cotton  bra pads to absorb leaked breast milk. Leaking of breast milk between feedings is normal.  Use lanolin on your nipples after breastfeeding. Lanolin helps to maintain your skin's normal moisture barrier. If you use pure lanolin, you do not need to wash it off before feeding your baby again. Pure lanolin is not toxic to your baby. You may also hand express a few drops of breast milk and gently massage that milk into your nipples and allow the milk to air dry.  In the first few weeks after giving birth, some women experience extremely full breasts (engorgement). Engorgement can make your   breasts feel heavy, warm, and tender to the touch. Engorgement peaks within 3-5 days after you give birth. The following recommendations can help ease engorgement:  Completely empty your breasts while breastfeeding or pumping. You may want to start by applying warm, moist heat (in the shower or with warm water-soaked hand towels) just before feeding or pumping. This increases circulation and helps the milk flow. If your baby does not completely empty your breasts while breastfeeding, pump any extra milk after he or she is finished.  Wear a snug bra (nursing or regular) or tank top for 1-2 days to signal your body to slightly decrease milk production.  Apply ice packs to your breasts, unless this is too uncomfortable for you.  Make sure that your baby is latched on and positioned properly while breastfeeding.  If engorgement persists after 48 hours of following these recommendations, contact your health care provider or a lactation consultant. Overall health care recommendations while breastfeeding  Eat healthy foods. Alternate between meals and snacks, eating 3 of each per day. Because what you eat affects your breast milk, some of the foods may make your baby more irritable than usual. Avoid eating these foods if you are sure that they are negatively affecting your baby.  Drink milk, fruit juice, and water to  satisfy your thirst (about 10 glasses a day).  Rest often, relax, and continue to take your prenatal vitamins to prevent fatigue, stress, and anemia.  Continue breast self-awareness checks.  Avoid chewing and smoking tobacco. Chemicals from cigarettes that pass into breast milk and exposure to secondhand smoke may harm your baby.  Avoid alcohol and drug use, including marijuana. Some medicines that may be harmful to your baby can pass through breast milk. It is important to ask your health care provider before taking any medicine, including all over-the-counter and prescription medicine as well as vitamin and herbal supplements. It is possible to become pregnant while breastfeeding. If birth control is desired, ask your health care provider about options that will be safe for your baby. Contact a health care provider if:  You feel like you want to stop breastfeeding or have become frustrated with breastfeeding.  You have painful breasts or nipples.  Your nipples are cracked or bleeding.  Your breasts are red, tender, or warm.  You have a swollen area on either breast.  You have a fever or chills.  You have nausea or vomiting.  You have drainage other than breast milk from your nipples.  Your breasts do not become full before feedings by the fifth day after you give birth.  You feel sad and depressed.  Your baby is too sleepy to eat well.  Your baby is having trouble sleeping.  Your baby is wetting less than 3 diapers in a 24-hour period.  Your baby has less than 3 stools in a 24-hour period.  Your baby's skin or the white part of his or her eyes becomes yellow.  Your baby is not gaining weight by 5 days of age. Get help right away if:  Your baby is overly tired (lethargic) and does not want to wake up and feed.  Your baby develops an unexplained fever. This information is not intended to replace advice given to you by your health care provider. Make sure you discuss  any questions you have with your health care provider. Document Released: 01/26/2005 Document Revised: 07/10/2015 Document Reviewed: 07/20/2012 Elsevier Interactive Patient Education  2017 [employer]  

## 2016-04-17 NOTE — Progress Notes (Signed)
   PRENATAL VISIT NOTE  Subjective:  Frazier ButtStephanie Clippinger is a 31 y.o. G1P0 at 8665w0d being seen today for ongoing prenatal care.  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  Patient reports occasional contractions.  Contractions: Irritability. Vag. Bleeding: None.  Movement: Present. Denies leaking of fluid.   The following portions of the patient's history were reviewed and updated as appropriate: allergies, current medications, past family history, past medical history, past social history, past surgical history and problem list. Problem list updated.  Objective:   Vitals:   04/17/16 0912  BP: 124/71  Pulse: 94  Weight: 234 lb (106.1 kg)    Fetal Status: Fetal Heart Rate (bpm): 147 Fundal Height: 44 cm Movement: Present  Presentation: Vertex  General:  Alert, oriented and cooperative. Patient is in no acute distress.  Skin: Skin is warm and dry. No rash noted.   Cardiovascular: Normal heart rate noted  Respiratory: Normal respiratory effort, no problems with respiration noted  Abdomen: Soft, gravid, appropriate for gestational age. Pain/Pressure: Present     Pelvic:  Cervical exam performed Dilation: Closed Effacement (%): 0 Station: -2  Extremities: Normal range of motion.  Edema: Mild pitting, slight indentation  Mental Status: Normal mood and affect. Normal behavior. Normal judgment and thought content.   All CBGs in range  Assessment and Plan:  Pregnancy: G1P0 at 4765w0d  1. 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 Pt has many questions about why she can't be induced at 38 weeks. States she was told st [facility] that she would be, but has been told 39 weeks by everyone else. Reviewed MFM recommendations and discussed w/ Dr. Debroah LoopArnold. No recommendation for 38 week IOL. Pt has excellent blood sugar control. Will continue w/ 39 week IOL.   2. Supervision of high risk pregnancy, antepartum   Term labor symptoms and general obstetric precautions including but not limited to vaginal bleeding, contractions, leaking of fluid and fetal movement were reviewed in detail with the patient. Please refer to After Visit Summary for other counseling recommendations.  Return in about 6 weeks (around 05/29/2016) for Postpartum visit.   Dorathy KinsmanVirginia Dia Jefferys, CNM

## 2016-04-17 NOTE — Lactation Note (Signed)
Lactation Consult  With this mom, type 1 diabetic, on insulin pump, and presently [redacted] weeks pregnant,. [facility], but was told she could not get on until the baby was born. She did have questions about doing hand expression, and collecting colostrum to bring to the hospital,  To feed the baby after birth, in case of baby being hypoglycemic. [facility] [place], and was told she could not start hand expression until she ws at least [redacted] weeks pregnant, which she states she is today.  Mom wanted to learn how to hand express, so I showed mom how to do so, and she demonstrated with good technique, and was able to collect one drop of colostrum . Mom denies any cramping or contractions after this. I did give her 11 ml snappies to collect her colostrum in, and yellow sticker to date and time them, and told her to keep them in the refrigerator after pumping. I will call mom, and tell her is she does not deliver within 5 days, to place these in the freezer until the baby si born. .  Parents very appreciative of my help. And I advised mom to speak to her OB about the above .   Mother's reason for visit: colostrum collection prior to delivery Visit Type:o/p la Consult:  Initial Lactation Consultant:  Alfred LevinsLee, Lashann Hagg Anne  ________________________________________________________________________    ________________________________________________________________________  Mother's Name: Frazier ButtStephanie Griffo Type of delivery:   Breastfeeding Experience:  none Maternal Medical Conditions:  Not applicable Maternal Medications   N/A  ________________________________________________________________________  Breastfeeding History (Post Discharge)  Frequency of breastfeeding:  N/A Duration of feeding: N/A  Patient does not supplement or pump.  Infant Intake and Output Assessment  Voids:   in 24 hrs.  Color:  N/A Stools:   in 24 hrs.   Color:  N/A  ________________________________________________________________________  Maternal Breast Assessment  Breast:  N/A Nipple:  Short shaft, small but evert Pain level:  N/A Pain interventions:  N/A  _________________________________________

## 2016-04-21 ENCOUNTER — Encounter (HOSPITAL_COMMUNITY): Payer: Self-pay

## 2016-04-21 ENCOUNTER — Ambulatory Visit (HOSPITAL_COMMUNITY)
Admission: RE | Admit: 2016-04-21 | Discharge: 2016-04-21 | Disposition: A | Discharge status: Home / Self Care | Payer: 59 | Source: Ambulatory Visit | Attending: Obstetrics & Gynecology | Admitting: Obstetrics & Gynecology

## 2016-04-21 ENCOUNTER — Inpatient Hospital Stay (HOSPITAL_COMMUNITY)
Admission: AD | Admit: 2016-04-21 | Discharge: 2016-04-28 | DRG: 766 | Disposition: A | Discharge status: Home / Self Care | Payer: 59 | Source: Ambulatory Visit | Attending: Family Medicine | Admitting: Family Medicine

## 2016-04-21 VITALS — BP 164/95 | HR 110

## 2016-04-21 DIAGNOSIS — O114 Pre-existing hypertension with pre-eclampsia, complicating childbirth: Principal | ICD-10-CM | POA: Diagnosis present

## 2016-04-21 DIAGNOSIS — Z833 Family history of diabetes mellitus: Secondary | ICD-10-CM | POA: Diagnosis not present

## 2016-04-21 DIAGNOSIS — E103293 Type 1 diabetes mellitus with mild nonproliferative diabetic retinopathy without macular edema, bilateral: Secondary | ICD-10-CM | POA: Diagnosis present

## 2016-04-21 DIAGNOSIS — F909 Attention-deficit hyperactivity disorder, unspecified type: Secondary | ICD-10-CM | POA: Diagnosis present

## 2016-04-21 DIAGNOSIS — O2402 Pre-existing diabetes mellitus, type 1, in childbirth: Secondary | ICD-10-CM | POA: Diagnosis present

## 2016-04-21 DIAGNOSIS — O133 Gestational [pregnancy-induced] hypertension without significant proteinuria, third trimester: Secondary | ICD-10-CM | POA: Diagnosis not present

## 2016-04-21 DIAGNOSIS — Z9641 Presence of insulin pump (external) (internal): Secondary | ICD-10-CM | POA: Insufficient documentation

## 2016-04-21 DIAGNOSIS — O1002 Pre-existing essential hypertension complicating childbirth: Secondary | ICD-10-CM | POA: Diagnosis present

## 2016-04-21 DIAGNOSIS — O10013 Pre-existing essential hypertension complicating pregnancy, third trimester: Secondary | ICD-10-CM | POA: Insufficient documentation

## 2016-04-21 DIAGNOSIS — Z3A37 37 weeks gestation of pregnancy: Secondary | ICD-10-CM | POA: Diagnosis not present

## 2016-04-21 DIAGNOSIS — Z3A38 38 weeks gestation of pregnancy: Secondary | ICD-10-CM

## 2016-04-21 DIAGNOSIS — O99214 Obesity complicating childbirth: Secondary | ICD-10-CM | POA: Diagnosis present

## 2016-04-21 DIAGNOSIS — I10 Essential (primary) hypertension: Secondary | ICD-10-CM | POA: Diagnosis present

## 2016-04-21 DIAGNOSIS — Z98891 History of uterine scar from previous surgery: Secondary | ICD-10-CM

## 2016-04-21 DIAGNOSIS — O324XX0 Maternal care for high head at term, not applicable or unspecified: Secondary | ICD-10-CM | POA: Diagnosis present

## 2016-04-21 DIAGNOSIS — O3663X0 Maternal care for excessive fetal growth, third trimester, not applicable or unspecified: Secondary | ICD-10-CM | POA: Diagnosis present

## 2016-04-21 DIAGNOSIS — Z3A39 39 weeks gestation of pregnancy: Secondary | ICD-10-CM | POA: Diagnosis not present

## 2016-04-21 DIAGNOSIS — Z6838 Body mass index (BMI) 38.0-38.9, adult: Secondary | ICD-10-CM | POA: Diagnosis not present

## 2016-04-21 DIAGNOSIS — O24013 Pre-existing diabetes mellitus, type 1, in pregnancy, third trimester: Secondary | ICD-10-CM | POA: Insufficient documentation

## 2016-04-21 DIAGNOSIS — O99344 Other mental disorders complicating childbirth: Secondary | ICD-10-CM | POA: Diagnosis present

## 2016-04-21 DIAGNOSIS — Z8249 Family history of ischemic heart disease and other diseases of the circulatory system: Secondary | ICD-10-CM | POA: Diagnosis not present

## 2016-04-21 DIAGNOSIS — O24919 Unspecified diabetes mellitus in pregnancy, unspecified trimester: Secondary | ICD-10-CM | POA: Diagnosis present

## 2016-04-21 DIAGNOSIS — O2432 Unspecified pre-existing diabetes mellitus in childbirth: Secondary | ICD-10-CM | POA: Diagnosis not present

## 2016-04-21 DIAGNOSIS — E109 Type 1 diabetes mellitus without complications: Secondary | ICD-10-CM | POA: Diagnosis present

## 2016-04-21 DIAGNOSIS — E119 Type 2 diabetes mellitus without complications: Secondary | ICD-10-CM | POA: Diagnosis not present

## 2016-04-21 LAB — CBC
HCT: 38.9 % (ref 36.0–46.0)
Hemoglobin: 13.3 g/dL (ref 12.0–15.0)
MCH: 30.6 pg (ref 26.0–34.0)
MCHC: 34.2 g/dL (ref 30.0–36.0)
MCV: 89.4 fL (ref 78.0–100.0)
Platelets: 182 10*3/uL (ref 150–400)
RBC: 4.35 MIL/uL (ref 3.87–5.11)
RDW: 13.5 % (ref 11.5–15.5)
WBC: 11.3 10*3/uL — ABNORMAL HIGH (ref 4.0–10.5)

## 2016-04-21 LAB — COMPREHENSIVE METABOLIC PANEL
ALT: 18 U/L (ref 14–54)
AST: 26 U/L (ref 15–41)
Albumin: 2.8 g/dL — ABNORMAL LOW (ref 3.5–5.0)
Alkaline Phosphatase: 138 U/L — ABNORMAL HIGH (ref 38–126)
Anion gap: 12 (ref 5–15)
BUN: 13 mg/dL (ref 6–20)
CO2: 19 mmol/L — ABNORMAL LOW (ref 22–32)
Calcium: 9.1 mg/dL (ref 8.9–10.3)
Chloride: 104 mmol/L (ref 101–111)
Creatinine, Ser: 0.62 mg/dL (ref 0.44–1.00)
GFR calc Af Amer: >60 mL/min (ref >60)
GFR calc non Af Amer: >60 mL/min (ref >60)
Glucose, Bld: 102 mg/dL — ABNORMAL HIGH (ref 65–99)
Potassium: 3.6 mmol/L (ref 3.5–5.1)
Sodium: 135 mmol/L (ref 135–145)
Total Bilirubin: 0.3 mg/dL (ref 0.3–1.2)
Total Protein: 6.2 g/dL — ABNORMAL LOW (ref 6.5–8.1)

## 2016-04-21 LAB — TYPE AND SCREEN
ABO/RH(D): O POS
Antibody Screen: NEGATIVE

## 2016-04-21 MED ORDER — ONDANSETRON HCL 4 MG/2ML IJ SOLN
4.0000 mg | Freq: Four times a day (QID) | INTRAMUSCULAR | Status: DC | PRN
  Filled 2016-04-21: qty 2

## 2016-04-21 MED ORDER — LACTATED RINGERS IV SOLN
INTRAVENOUS | Status: DC
  Administered 2016-04-22 – 2016-04-23 (×3): via INTRAVENOUS

## 2016-04-21 MED ORDER — OXYTOCIN 40 UNITS IN LACTATED RINGERS INFUSION - SIMPLE MED: 2.5000 [IU]/h | INTRAVENOUS | Status: DC

## 2016-04-21 MED ORDER — OXYTOCIN BOLUS FROM INFUSION: 500.0000 mL | Freq: Once | INTRAVENOUS | Status: DC

## 2016-04-21 MED ORDER — SOD CITRATE-CITRIC ACID 500-334 MG/5ML PO SOLN
30.0000 mL | ORAL | Status: DC | PRN
  Administered 2016-04-24: 30 mL via ORAL
  Filled 2016-04-21 (×2): qty 15

## 2016-04-21 MED ORDER — FLEET ENEMA 7-19 GM/118ML RE ENEM: 1.0000 | ENEMA | RECTAL | Status: DC | PRN

## 2016-04-21 MED ORDER — LACTATED RINGERS IV SOLN: 500.0000 mL | INTRAVENOUS | Status: DC | PRN

## 2016-04-21 MED ORDER — LIDOCAINE HCL (PF) 1 % IJ SOLN
30.0000 mL | INTRAMUSCULAR | Status: DC | PRN
  Filled 2016-04-21: qty 30

## 2016-04-21 MED ORDER — ACETAMINOPHEN 325 MG PO TABS
650.0000 mg | ORAL_TABLET | ORAL | Status: DC | PRN
Start: 2016-04-21 — End: 2016-04-24
  Administered 2016-04-22 – 2016-04-24 (×4): 650 mg via ORAL
  Filled 2016-04-21 (×4): qty 2

## 2016-04-21 MED ORDER — OXYCODONE-ACETAMINOPHEN 5-325 MG PO TABS: 2.0000 | ORAL_TABLET | ORAL | Status: DC | PRN

## 2016-04-21 MED ORDER — OXYCODONE-ACETAMINOPHEN 5-325 MG PO TABS: 1.0000 | ORAL_TABLET | ORAL | Status: DC | PRN

## 2016-04-21 NOTE — MAU Note (Signed)
Pt seen by Dr Marice Potterove in triage, decision for direct admit.  Dr Marice Potterove called birthing suites.  Pt aware of bed situation, is going home to get things- permission given by Dr Marice Potterove

## 2016-04-21 NOTE — ED Notes (Signed)
Seen in [facility] with BPP 8/10 after reactive NST.  Pt states she has had a headache all day. Took tylenol this am without relief.  H/a has been nagging all day.  Pedal edema up to calf area and swelling in hands.  Pt denies epigastric pain or visual changes.  [facility]  Pt taken to [facility]U to register.

## 2016-04-21 NOTE — ED Notes (Signed)
Pt refused to have her weight taken today.

## 2016-04-21 NOTE — H&P (Deleted)
LABOR AND DELIVERY ADMISSION HISTORY AND PHYSICAL NOTE  Valerie Dennis is a 31 y.o. female G1P0 with IUP at [redacted]w[redacted]d by [place] presenting for IOL.   She reports positive fetal movement. She denies leakage of fluid or vaginal bleeding.  Prenatal History/Complications:  Past Medical History: Past Medical History:  Diagnosis Date  . Diabetes mellitus without complication (HCC)   . Hypertension     Past Surgical History: Past Surgical History:  Procedure Laterality Date  . CAUTERY OF TURBINATES  2003  . DE QUERVAIN'S RELEASE  2016  . WISDOM TOOTH EXTRACTION      Obstetrical History: OB History    Gravida Para Term Preterm AB Living   1         0   SAB TAB Ectopic Multiple Live Births                  Social History: Social History   Social History  . Marital status: Married    Spouse name: N/A  . Number of children: N/A  . Years of education: N/A   Social History Main Topics  . Smoking status: Never Smoker  . Smokeless tobacco: Never Used  . Alcohol use 0.0 oz/week     Comment: Socially before pregnant  . Drug use: No  . Sexual activity: Yes    Birth control/ protection: None   Other Topics Concern  . None   Social History Narrative  . None    Family History: Family History  Problem Relation Age of Onset  . Hyperlipidemia Mother   . Rheum arthritis Mother   . Heart disease Father   . Hyperlipidemia Father   . Hypertension Father   . Stroke Father   . Hypertension Sister   . Heart disease Maternal Grandmother   . Heart disease Maternal Grandfather   . Heart disease Paternal Grandfather   . Hypertension Paternal Grandfather   . Diabetes Paternal Grandfather     Allergies: Allergies  Allergen Reactions  . Amoxicillin Rash    Has patient had a PCN reaction causing immediate rash, facial/tongue/throat swelling, SOB or lightheadedness with hypotension: no Has patient had a PCN reaction causing severe rash involving mucus membranes or skin necrosis:  yes Has patient had a PCN reaction that required hospitalization no Has patient had a PCN reaction occurring within the last 10 years: no If all of the above answers are "NO", then may proceed with Cephalosporin use.     Prescriptions Prior to Admission  Medication Sig Dispense Refill Last Dose  . BAYER CONTOUR NEXT TEST test strip   11 Taking  . Insulin Human (INSULIN PUMP) SOLN Inject 1 each into the skin continuous. Humalog insulin in pump, up to 150 units per day   Taking  . insulin lispro (HUMALOG) 100 UNIT/ML KiwkPen as needed   Taking  . mometasone (NASONEX) 50 MCG/ACT nasal spray Place 2 sprays into the nose daily.   Taking  . Prenatal Vit-Fe Fumarate-FA (MULTIVITAMIN-PRENATAL) 27-0.8 MG TABS tablet Take 1 tablet by mouth daily at 12 noon.   Taking     Review of Systems   All systems reviewed and negative except as stated in HPI  Blood pressure (!) 157/99, pulse 100, last menstrual period 07/26/2015. General appearance: alert, cooperative and no distress Lungs: clear to auscultation bilaterally Heart: regular rate and rhythm Abdomen: soft, non-tender; bowel sounds normal Extremities: No calf swelling or tenderness Presentation: cephalic Fetal monitoring:  Uterine activity:      Prenatal labs: ABO, Rh:  O/POS/-- (08/14 1642) Antibody: NEG (08/14 1642) Rubella: Equivocal (0.96) RPR: Non Reactive (01/09 1644)  HBsAg: NEGATIVE (08/14 1642)  HIV: Non Reactive (01/09 1644)  GBS: Negative (02/23 0000)  1 hr Glucola: type 1 diabetic Genetic screening:  Normal Anatomy US: Normal (4054 g, >90% 6866w0d)  Prenatal Transfer Tool  Maternal Diabetes: Yes:  Diabetes Type:  Insulin/Medication controlled Genetic Screening: Normal Maternal Ultrasounds/Referrals: Normal (4054 g, >90% 9866w0d) Fetal Ultrasounds or other Referrals:  None Maternal Substance Abuse:  No Significant Maternal Medications:  None Significant Maternal Lab Results: Lab values include: Group B Strep  negative  No results found for this or any previous visit (from the past 24 hour(s)).  Patient Active Problem List   Diagnosis Date Noted  . ADHD (attention deficit hyperactivity disorder) 03/04/2016  . Diabetes mellitus in pregnancy 10/18/2015  . Supervision of high risk pregnancy, antepartum 09/25/2015  . 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 Hypertension 02/18/2015  . HLD (hyperlipidemia) 03/30/2014  . Type 1 diabetes mellitus (HCC) 03/14/2014  . Hypertrophy of nasal turbinates 05/16/2013    Assessment: Valerie Dennis is a 31 y.o. G1P0 at 5951w4d here for   #Labor: #Pain:  #FWB:  #ID:   #MOF:  #MOC: #Circ:    Wendee BeaversDavid J McMullen 04/21/2016, 10:52 PM

## 2016-04-21 NOTE — Procedures (Signed)
Valerie ButtStephanie Dennis 02/28/1985 5880w4d  Fetus A Non-Stress Test Interpretation for 04/21/16  Indication: Chronic Hypertenstion  Fetal Heart Rate A Mode: External Baseline Rate (A): 120 bpm Variability: Moderate Accelerations: 15 x 15 Decelerations: None Multiple birth?: No  Uterine Activity Mode: Palpation, Toco Contraction Frequency (min): 2 Contraction Duration (sec): 30-60 Contraction Quality: Mild Resting Tone Palpated: Relaxed Resting Time: Adequate  Interpretation (Fetal Testing) Nonstress Test Interpretation: Reactive Overall Impression: Reassuring for gestational age Comments: Reviewed tracing with Dr. Claudean SeveranceWhitecar

## 2016-04-22 ENCOUNTER — Encounter: Payer: 59 | Admitting: Obstetrics & Gynecology

## 2016-04-22 DIAGNOSIS — Z3A38 38 weeks gestation of pregnancy: Secondary | ICD-10-CM

## 2016-04-22 DIAGNOSIS — O133 Gestational [pregnancy-induced] hypertension without significant proteinuria, third trimester: Secondary | ICD-10-CM

## 2016-04-22 LAB — GLUCOSE, CAPILLARY
Glucose-Capillary: 103 mg/dL — ABNORMAL HIGH (ref 65–99)
Glucose-Capillary: 177 mg/dL — ABNORMAL HIGH (ref 65–99)
Glucose-Capillary: 114 mg/dL — ABNORMAL HIGH (ref 65–99)
Glucose-Capillary: 56 mg/dL — ABNORMAL LOW (ref 65–99)
Glucose-Capillary: 116 mg/dL — ABNORMAL HIGH (ref 65–99)
Glucose-Capillary: 46 mg/dL — ABNORMAL LOW (ref 65–99)
Glucose-Capillary: 117 mg/dL — ABNORMAL HIGH (ref 65–99)
Glucose-Capillary: 78 mg/dL (ref 65–99)

## 2016-04-22 LAB — PROTEIN / CREATININE RATIO, URINE
Creatinine, Urine: 139 mg/dL
Protein Creatinine Ratio: 0.09 mg/mg{Cre} (ref 0.00–0.15)
Total Protein, Urine: 12 mg/dL

## 2016-04-22 LAB — ABO/RH: ABO/RH(D): O POS

## 2016-04-22 MED ORDER — TERBUTALINE SULFATE 1 MG/ML IJ SOLN: 0.2500 mg | Freq: Once | INTRAMUSCULAR | Status: DC | PRN

## 2016-04-22 MED ORDER — MISOPROSTOL 25 MCG QUARTER TABLET
25.0000 ug | ORAL_TABLET | ORAL | Status: DC | PRN
  Administered 2016-04-22 (×2): 25 ug via VAGINAL
  Filled 2016-04-22 (×2): qty 0.25

## 2016-04-22 MED ORDER — OXYTOCIN 40 UNITS IN LACTATED RINGERS INFUSION - SIMPLE MED
1.0000 m[IU]/min | INTRAVENOUS | Status: DC
  Administered 2016-04-23: 2 m[IU]/min via INTRAVENOUS
  Filled 2016-04-22: qty 1000

## 2016-04-22 MED ORDER — ZOLPIDEM TARTRATE 5 MG PO TABS
5.0000 mg | ORAL_TABLET | Freq: Every evening | ORAL | Status: DC | PRN
  Administered 2016-04-22 (×2): 5 mg via ORAL
  Filled 2016-04-22 (×3): qty 1

## 2016-04-22 MED ORDER — OXYTOCIN 40 UNITS IN LACTATED RINGERS INFUSION - SIMPLE MED
1.0000 m[IU]/min | INTRAVENOUS | Status: DC
  Administered 2016-04-22: 1 m[IU]/min via INTRAVENOUS
  Filled 2016-04-22: qty 1000

## 2016-04-22 MED ORDER — INSULIN PUMP
SUBCUTANEOUS | Status: DC
  Administered 2016-04-25: 9 via SUBCUTANEOUS
  Administered 2016-04-25 – 2016-04-27 (×2): via SUBCUTANEOUS
  Administered 2016-04-27: 86 via SUBCUTANEOUS
  Administered 2016-04-27 (×2): via SUBCUTANEOUS
  Filled 2016-04-22: qty 1

## 2016-04-22 NOTE — Progress Notes (Signed)
Valerie ButtStephanie Dennis is a 31 y.o. G1P0 at 5435w5d  admitted for induction of labor due to Diabetes and Hypertension.  Subjective:  Comfortable at this time. Had a nap earlier. Feeling some cramping.  Objective: BP 120/64   Pulse 79   Temp 97.6 F (36.4 C) (Axillary)   Resp 17   Ht 5\' 4"  (1.626 m)   Wt 225 lb (102.1 kg)   LMP 07/26/2015 (Exact Date)   BMI 38.62 kg/m  No intake/output data recorded. No intake/output data recorded.  FHT:  FHR: 135 bpm, variability: moderate,  accelerations:  Present,  decelerations:  Absent UC:   irregular, difficult to trace, monitor adjusted  SVE:   Dilation: 5 Effacement (%): 80 Station: -1 Exam by:: Zorita PangH. Hogan, CNM  Labs: Lab Results  Component Value Date   WBC 11.3 (H) 04/21/2016   HGB 13.3 04/21/2016   HCT 38.9 04/21/2016   MCV 89.4 04/21/2016   PLT 182 04/21/2016    Assessment / Plan: IOL, on pitocin. Will conitnue to monitor and increase as needed.   Labor: Progressing normally Preeclampsia:  no signs or symptoms of toxicity Fetal Wellbeing:  Category I Pain Control:  Labor support without medications I/D:  n/a Anticipated MOD:  NSVD  [name], Heather Donovan 04/22/2016, 3:14 PM

## 2016-04-22 NOTE — Anesthesia Pain Management Evaluation Note (Signed)
  CRNA Pain Management Visit Note  Patient: Valerie Dennis, [age] y.o., female  "Hello I am a member of the anesthesia team at [facility]. We have an anesthesia team available at all times to provide care throughout the hospital, including epidural management and anesthesia for C-section. I don't know your plan for the delivery whether it a natural birth, water birth, IV sedation, nitrous supplementation, doula or epidural, but we want to meet your pain goals."   1.Was your pain managed to your expectations on prior hospitalizations?   No prior hospitalizations  2.What is your expectation for pain management during this hospitalization?     Epidural  3.How can we help you reach that goal? Epidural when I can get one.  Record the patient's initial score and the patient's pain goal.   Pain: 0  Pain Goal: 5 [facility] wants you to be able to say your pain was always managed very well.  Valerie Dennis 04/22/2016

## 2016-04-22 NOTE — Progress Notes (Signed)
Patient ID: Valerie ButtStephanie Dennis, female   DOB: 1985/11/24, 31 y.o.   MRN: [id number]  Feels well; occ cramping; Pit x 10h (s/p cyto and FB)  BP 143/89, 154/91, other VSS FHR 130s, +accels, no decels Ctx irreg q 2-6 mins, Pit @ 529mu/min Cx 5/80/-1, unchanged  IUP@38 .5wks Type 1 DM cHTN IOL process  Reviewed options with patient and husband including continuing to increase Pit to 7720mu/min and then taking a Pit break overnight, vs stopping Pit now and restarting in the early AM, vs continuing on Pit during the night. They are exhausted and choose to stop Pit now, eat dinner, sleep, then restart Pit in the AM @ 322mu/min and go up by 2.  Cam HaiSHAW, Argelio Granier CNM 04/22/2016 10:25 PM

## 2016-04-22 NOTE — H&P (Signed)
Frazier ButtStephanie Perrell is a 31 y.o. G1P0 female at 5032w5d by LMP c/w 7wk u/s, presenting for IOL d/t CHTN-no meds, Type1DM-insulin pump. Was seen in MFM today for BPP which was 6/8 (-2 for absent fetal breathing), then reactive NST for total 8/10. Had 2 severe-range bp's in MFM, sent to [facility] and was seen by Dr. Marice Potterove and decision was made to admit for IOL. No bed was available on L&D at the time, so she was allowed to go home and get things, etc, and she returned around 2230. Woke up ~0400 w/ headache which resolved w/ apap, then returned during the day. Denies visual changes, ruq/epigastric pain, n/v, etc. Reports active fetal movement, contractions: irregular, vaginal bleeding: none, membranes: intact. [facility]   Most recent growth u/s 3/6 @ 37.4wks, efw 4054g/>90% w/ AC >97%, normal AFI.   This pregnancy complicated by: Type1DM on insulin pump w/ suspsected LGA CHTN- no meds Hyperlipidemia ADHD Hypertrophy of nasal turbinates  Prenatal History/Complications:  Primigravida  Past Medical History: Past Medical History:  Diagnosis Date  . Diabetes mellitus without complication (HCC)   . Hypertension     Past Surgical History: Past Surgical History:  Procedure Laterality Date  . CAUTERY OF TURBINATES  2003  . DE QUERVAIN'S RELEASE  2016  . WISDOM TOOTH EXTRACTION      Obstetrical History: OB History    Gravida Para Term Preterm AB Living   1         0   SAB TAB Ectopic Multiple Live Births                  Social History: Social History   Social History  . Marital status: Married    Spouse name: N/A  . Number of children: N/A  . Years of education: N/A   Social History Main Topics  . Smoking status: Never Smoker  . Smokeless tobacco: Never Used  . Alcohol use 0.0 oz/week     Comment: Socially before pregnant  . Drug use: No  . Sexual activity: Yes    Birth control/ protection: None   Other Topics Concern  . None   Social  History Narrative  . None    Family History: Family History  Problem Relation Age of Onset  . Hyperlipidemia Mother   . Rheum arthritis Mother   . Heart disease Father   . Hyperlipidemia Father   . Hypertension Father   . Stroke Father   . Hypertension Sister   . Heart disease Maternal Grandmother   . Heart disease Maternal Grandfather   . Heart disease Paternal Grandfather   . Hypertension Paternal Grandfather   . Diabetes Paternal Grandfather     Allergies: Allergies  Allergen Reactions  . Amoxicillin Rash    Has patient had a PCN reaction causing immediate rash, facial/tongue/throat swelling, SOB or lightheadedness with hypotension: no Has patient had a PCN reaction causing severe rash involving mucus membranes or skin necrosis: yes Has patient had a PCN reaction that required hospitalization no Has patient had a PCN reaction occurring within the last 10 years: no If all of the above answers are "NO", then may proceed with Cephalosporin use.     Prescriptions Prior to Admission  Medication Sig Dispense Refill Last Dose  . BAYER CONTOUR NEXT TEST test strip   11 Taking  . Insulin Human (INSULIN PUMP) SOLN Inject 1 each into the skin continuous. Humalog insulin in pump, up to 150 units per day  Taking  . insulin lispro (HUMALOG) 100 UNIT/ML KiwkPen as needed   Taking  . mometasone (NASONEX) 50 MCG/ACT nasal spray Place 2 sprays into the nose daily.   Taking  . Prenatal Vit-Fe Fumarate-FA (MULTIVITAMIN-PRENATAL) 27-0.8 MG TABS tablet Take 1 tablet by mouth daily at 12 noon.   Taking    Review of Systems  Pertinent pos/neg as indicated in HPI  Blood pressure (!) 140/93, pulse (!) 102, height 5\' 4"  (1.626 m), weight 102.1 kg (225 lb), last menstrual period 07/26/2015. General appearance: alert, cooperative and no distress Lungs: clear to auscultation bilaterally Heart: regular rate and rhythm Abdomen: gravid, soft, non-tender Extremities: tr edema DTR's 2+, no  clonus  Fetal monitoring: FHR: 125 bpm, variability: moderate,  Accelerations: Present,  decelerations:  Absent Uterine activity: irregular Dilation: 1 Effacement (%): 70 Station: -2 Exam by:: Booker Presentation: cephalic  0020: Cervical foley bulb inserted and inflated w/ 60ml LR w/o difficulty; also placed cytotec pv  Prenatal labs: ABO, Rh: --/--/O POS (03/13 2255) Antibody: NEG (03/13 2255) Rubella: !Error! RPR: Non Reactive (01/09 1644)  HBsAg: NEGATIVE (08/14 1642)  HIV: Non Reactive (01/09 1644)  GBS: Negative (02/23 0000)   A1C at beginning of pregnancy 7.2, most recently 6.5 in Jan Genetic screening:  Normal female by NIPS Anatomy US: normal, limited view ductal arch w/ normal fetal echo  Results for orders placed or performed during the hospital encounter of 04/21/16 (from the past 24 hour(s))  CBC   Collection Time: 04/21/16 10:55 PM  Result Value Ref Range   WBC 11.3 (H) 4.0 - 10.5 K/uL   RBC 4.35 3.87 - 5.11 MIL/uL   Hemoglobin 13.3 12.0 - 15.0 g/dL   HCT 16.1 09.6 - 04.5 %   MCV 89.4 78.0 - 100.0 fL   MCH 30.6 26.0 - 34.0 pg   MCHC 34.2 30.0 - 36.0 g/dL   RDW 40.9 81.1 - 91.4 %   Platelets 182 150 - 400 K/uL  Comprehensive metabolic panel   Collection Time: 04/21/16 10:55 PM  Result Value Ref Range   Sodium 135 135 - 145 mmol/L   Potassium 3.6 3.5 - 5.1 mmol/L   Chloride 104 101 - 111 mmol/L   CO2 19 (L) 22 - 32 mmol/L   Glucose, Bld 102 (H) 65 - 99 mg/dL   BUN 13 6 - 20 mg/dL   Creatinine, Ser 7.82 0.44 - 1.00 mg/dL   Calcium 9.1 8.9 - 95.6 mg/dL   Total Protein 6.2 (L) 6.5 - 8.1 g/dL   Albumin 2.8 (L) 3.5 - 5.0 g/dL   AST 26 15 - 41 U/L   ALT 18 14 - 54 U/L   Alkaline Phosphatase 138 (H) 38 - 126 U/L   Total Bilirubin 0.3 0.3 - 1.2 mg/dL   GFR calc non Af Amer >60 >60 mL/min   GFR calc Af Amer >60 >60 mL/min   Anion gap 12 5 - 15  Type and screen [facility]   Collection Time: 04/21/16 10:55 PM  Result Value Ref  Range   ABO/RH(D) O POS    Antibody Screen NEG    Sample Expiration 04/24/2016      Assessment:  [redacted]w[redacted]d SIUP  G1P0  IOL d/t CHTN w/ concern for worsening CHTN vs. Super-imposed pre-e  Type1DM-pump  Suspected LGA  Cat 1 FHR  GBS Negative (02/23 0000)  Plan:  Admit to BS  IV pain meds/epidural prn active labor  Cervical foley bulb in place, as well as  vaginal cytotec- plan for q 4hrs  Pre-e labs pending  Pt can control on insulin pump  CBGs q 4hr latent phase, then q2hr active phase  Anticipate NSVB   Plans to breastfeed  Contraception: condoms then pills when no longer breastfeeding  Circumcision: n/a  Marge Duncans CNM, WHNP-BC 04/22/2016, 1:09 AM

## 2016-04-22 NOTE — Progress Notes (Signed)
Valerie Dennis is a 31 y.o. G1P0 at 5152w5d  admitted for IOL 2/2 hypertension, DM  Subjective: No complaints. Not feeling contractions at this time. Planning to take a nap.  Objective: BP 138/83   Pulse 79   Temp 98.1 F (36.7 C) (Oral)   Resp 16   Ht 5\' 4"  (1.626 m)   Wt 225 lb (102.1 kg)   LMP 07/26/2015 (Exact Date)   BMI 38.62 kg/m  No intake/output data recorded. No intake/output data recorded.  FHT:  FHR: 140 bpm, variability: moderate,  accelerations:  Present,  decelerations:  Absent UC:   irregular, every 6+ minutes SVE:   Dilation: 5 Effacement (%): 80 Station: -1 Exam by:: Zorita PangH. Hogan, CNM  Labs: Lab Results  Component Value Date   WBC 11.3 (H) 04/21/2016   HGB 13.3 04/21/2016   HCT 38.9 04/21/2016   MCV 89.4 04/21/2016   PLT 182 04/21/2016    Assessment / Plan: IOL, on pitocin. Not in labor at this time.   Labor: Latent phase  Preeclampsia:  NA Fetal Wellbeing:  Category I Pain Control:  Labor support without medications I/D:  n/a Anticipated MOD:  NSVD  Valerie Dennis, Valerie Dennis 04/22/2016, 7:31 PM

## 2016-04-22 NOTE — Progress Notes (Signed)
Frazier ButtStephanie Duggan is a 31 y.o. G1P0 at 5928w5d admitted for induction of labor due to Diabetes and Hypertension.  Subjective:  No complaints at this time. Questions answered.  Objective: BP (!) 141/94   Pulse (!) 109   Temp 97.9 F (36.6 C) (Axillary)   Resp 18   Ht 5\' 4"  (1.626 m)   Wt 225 lb (102.1 kg)   LMP 07/26/2015 (Exact Date)   BMI 38.62 kg/m  No intake/output data recorded. No intake/output data recorded.  FHT:  FHR: 135 bpm, variability: moderate,  accelerations:  Present,  decelerations:  Absent UC:   irregular, every 3-6 minutes SVE:   Dilation: 5 Effacement (%): 80 Station: -1 Exam by:: Zorita PangH. Shrey Boike, CNM  Labs: Lab Results  Component Value Date   WBC 11.3 (H) 04/21/2016   HGB 13.3 04/21/2016   HCT 38.9 04/21/2016   MCV 89.4 04/21/2016   PLT 182 04/21/2016    Assessment / Plan: IOL 2/2 DM and CHTN, foley bulb out. Will start pitocin  Labor: Progressing normally Preeclampsia:  no signs or symptoms of toxicity Fetal Wellbeing:  Category I Pain Control:  Labor support without medications I/D:  n/a Anticipated MOD:  NSVD  [name], Kabao Leite Donovan 04/22/2016, 9:53 AM

## 2016-04-23 ENCOUNTER — Encounter (HOSPITAL_COMMUNITY): Payer: Self-pay | Admitting: *Deleted

## 2016-04-23 ENCOUNTER — Inpatient Hospital Stay (HOSPITAL_COMMUNITY): Payer: 59 | Admitting: Anesthesiology

## 2016-04-23 LAB — CBC
HCT: 40.2 % (ref 36.0–46.0)
Hemoglobin: 13.7 g/dL (ref 12.0–15.0)
MCH: 30.5 pg (ref 26.0–34.0)
MCHC: 34.1 g/dL (ref 30.0–36.0)
MCV: 89.5 fL (ref 78.0–100.0)
Platelets: 156 10*3/uL (ref 150–400)
RBC: 4.49 MIL/uL (ref 3.87–5.11)
RDW: 13.5 % (ref 11.5–15.5)
WBC: 11.9 10*3/uL — ABNORMAL HIGH (ref 4.0–10.5)

## 2016-04-23 LAB — GLUCOSE, CAPILLARY
Glucose-Capillary: 95 mg/dL (ref 65–99)
Glucose-Capillary: 96 mg/dL (ref 65–99)
Glucose-Capillary: 67 mg/dL (ref 65–99)
Glucose-Capillary: 159 mg/dL — ABNORMAL HIGH (ref 65–99)
Glucose-Capillary: 84 mg/dL (ref 65–99)

## 2016-04-23 LAB — RPR: RPR Ser Ql: NONREACTIVE

## 2016-04-23 MED ORDER — LIDOCAINE HCL (PF) 1 % IJ SOLN
INTRAMUSCULAR | Status: DC | PRN
  Administered 2016-04-23: 6 mL via EPIDURAL
  Administered 2016-04-23: 7 mL via EPIDURAL

## 2016-04-23 MED ORDER — PHENYLEPHRINE 40 MCG/ML (10ML) SYRINGE FOR IV PUSH (FOR BLOOD PRESSURE SUPPORT): 80.0000 ug | PREFILLED_SYRINGE | INTRAVENOUS | Status: DC | PRN

## 2016-04-23 MED ORDER — EPHEDRINE 5 MG/ML INJ: 10.0000 mg | INTRAVENOUS | Status: DC | PRN

## 2016-04-23 MED ORDER — PHENYLEPHRINE 40 MCG/ML (10ML) SYRINGE FOR IV PUSH (FOR BLOOD PRESSURE SUPPORT)
80.0000 ug | PREFILLED_SYRINGE | INTRAVENOUS | Status: DC | PRN
  Filled 2016-04-23: qty 10

## 2016-04-23 MED ORDER — DIPHENHYDRAMINE HCL 50 MG/ML IJ SOLN: 12.5000 mg | INTRAMUSCULAR | Status: DC | PRN

## 2016-04-23 MED ORDER — LACTATED RINGERS IV SOLN
500.0000 mL | Freq: Once | INTRAVENOUS | Status: AC
  Administered 2016-04-23: 1000 mL via INTRAVENOUS

## 2016-04-23 MED ORDER — FENTANYL 2.5 MCG/ML BUPIVACAINE 1/10 % EPIDURAL INFUSION (WH - ANES)
14.0000 mL/h | INTRAMUSCULAR | Status: DC | PRN
  Administered 2016-04-23 – 2016-04-24 (×4): 14 mL/h via EPIDURAL
  Filled 2016-04-23 (×4): qty 100

## 2016-04-23 NOTE — Progress Notes (Signed)
Patient ID: Valerie ButtStephanie Dennis, female   DOB: [birth year], 31 y.o.   MRN: [id number]  Rested during the night; feels much better  BP 114/70, other VSS FHR 135-145, +accels, no decels Rare ctx Cx- exam soon  CBGs within range  IUP@term  Favorable cx Type 1 DM cHTN- stable  Will start Pit 2x2; plan AROM once cx begins to change; anticipate SVD  Cam HaiSHAW, KIMBERLY CNM 04/23/2016 9:04 AM

## 2016-04-23 NOTE — Anesthesia Preprocedure Evaluation (Signed)
Anesthesia Evaluation  Patient identified by MRN, date of birth, ID band Patient awake    Reviewed: Allergy & Precautions, H&P , NPO status , Patient's Chart, lab work & pertinent test results  Airway Mallampati: III  TM Distance: >3 FB Neck ROM: full    Dental no notable dental hx.    Pulmonary neg pulmonary ROS,    Pulmonary exam normal        Cardiovascular hypertension, Pt. on medications negative cardio ROS Normal cardiovascular exam     Neuro/Psych negative neurological ROS  negative psych ROS   GI/Hepatic negative GI ROS, Neg liver ROS,   Endo/Other  negative endocrine ROSdiabetes, GestationalMorbid obesity  Renal/GU negative Renal ROS     Musculoskeletal   Abdominal (+) + obese,   Peds  Hematology negative hematology ROS (+)   Anesthesia Other Findings   Reproductive/Obstetrics (+) Pregnancy                             Anesthesia Physical Anesthesia Plan  ASA: III  Anesthesia Plan: Epidural   Post-op Pain Management:    Induction:   Airway Management Planned:   Additional Equipment:   Intra-op Plan:   Post-operative Plan:   Informed Consent: I have reviewed the patients History and Physical, chart, labs and discussed the procedure including the risks, benefits and alternatives for the proposed anesthesia with the patient or authorized representative who has indicated his/her understanding and acceptance.     Plan Discussed with:   Anesthesia Plan Comments:         Anesthesia Quick Evaluation

## 2016-04-23 NOTE — Progress Notes (Signed)
Patient ID: Valerie ButtStephanie Dennis, female   DOB: 1985-10-12, 31 y.o.   MRN: [id number]  S: Patient seen & examined for progress of labor. Patient comfortable in the room. Has been moving from chair to bed. Patient refusing AROM at this time despite no further change in cervical dilation, pitocin has been increased all morning with irregular contraction pattern (occasionally q2-573min, then spaces out).     O:  Vitals:   04/23/16 1130 04/23/16 1200 04/23/16 1230 04/23/16 1305  BP: 110/63 126/90  (!) 141/91  Pulse: 90 82  84  Resp: 20 16 18 20   Temp:      TempSrc:      Weight:      Height:        Dilation: 5 Effacement (%): 60, 70 Cervical Position: Anterior Station: -2 Presentation: Vertex Exam by:: Enis SlipperJane Bailey, RN   FHT: 145 bpm, mod var, +accels, no decels TOCO: q2-133min   A/P: Will reevaluate for AROM in 1-2 hours Continue expectant management Anticipate SVD

## 2016-04-23 NOTE — Progress Notes (Signed)
Patient ID: [name], female   DOB: 06/30/1985, 31 y.o.   MRN: [id number]  S: Patient seen & examined for progress of labor. Patient comfortable in the room. Discussed with patient again about augmentation with AROM, she is now agreeing to this.     O:  Vitals:   04/23/16 1324 04/23/16 1330 04/23/16 1338 04/23/16 1400  BP: 138/76  (!) 147/84 (!) 161/78  Pulse: 78  97 90  Resp: 18 18 18 18   Temp:      TempSrc:      Weight:      Height:        Dilation: 5 Effacement (%): 60, 70 Cervical Position: Anterior Station: -2 Presentation: Vertex Exam by:: Dr. Omer JackMumaw  AROM performed, moderate clear fluid returned. Patient and baby tolerated procedure well.  FHT: 140 bpm, mod var, +accels, no decels TOCO: q1-672min   A/P: AROM Continue pitocin Continue expectant management Anticipate SVD

## 2016-04-23 NOTE — Anesthesia Procedure Notes (Signed)
Epidural Patient location during procedure: OB Start time: 04/23/2016 6:18 PM End time: 04/23/2016 6:20 PM  Staffing Anesthesiologist: Leilani AbleHATCHETT, Shadoe Cryan Performed: anesthesiologist   Preanesthetic Checklist Completed: patient identified, surgical consent, pre-op evaluation, timeout performed, IV checked, risks and benefits discussed and monitors and equipment checked  Epidural Patient position: sitting Prep: site prepped and draped and DuraPrep Patient monitoring: continuous pulse ox and blood pressure Approach: midline Location: L3-L4 Injection technique: LOR air  Needle:  Needle type: Tuohy  Needle gauge: 17 G Needle length: 9 cm and 9 Needle insertion depth: 7 cm Catheter type: closed end flexible Catheter size: 19 Gauge Catheter at skin depth: 12 cm Test dose: negative and Other  Assessment Sensory level: T9 Events: blood not aspirated, injection not painful, no injection resistance, negative IV test and no paresthesia  Additional Notes Reason for block:procedure for pain

## 2016-04-24 ENCOUNTER — Encounter (HOSPITAL_COMMUNITY): Payer: Self-pay

## 2016-04-24 ENCOUNTER — Encounter (HOSPITAL_COMMUNITY)
Admission: AD | Disposition: A | Discharge status: Home / Self Care | Payer: Self-pay | Source: Ambulatory Visit | Attending: Family Medicine

## 2016-04-24 ENCOUNTER — Inpatient Hospital Stay (HOSPITAL_COMMUNITY): Admission: RE | Admit: 2016-04-24 | Payer: 59 | Source: Ambulatory Visit

## 2016-04-24 DIAGNOSIS — E103293 Type 1 diabetes mellitus with mild nonproliferative diabetic retinopathy without macular edema, bilateral: Secondary | ICD-10-CM | POA: Diagnosis not present

## 2016-04-24 DIAGNOSIS — O1002 Pre-existing essential hypertension complicating childbirth: Secondary | ICD-10-CM | POA: Diagnosis not present

## 2016-04-24 DIAGNOSIS — O114 Pre-existing hypertension with pre-eclampsia, complicating childbirth: Secondary | ICD-10-CM | POA: Diagnosis not present

## 2016-04-24 DIAGNOSIS — O2402 Pre-existing diabetes mellitus, type 1, in childbirth: Secondary | ICD-10-CM | POA: Diagnosis not present

## 2016-04-24 DIAGNOSIS — O2432 Unspecified pre-existing diabetes mellitus in childbirth: Secondary | ICD-10-CM

## 2016-04-24 DIAGNOSIS — O324XX0 Maternal care for high head at term, not applicable or unspecified: Secondary | ICD-10-CM | POA: Diagnosis not present

## 2016-04-24 DIAGNOSIS — O99344 Other mental disorders complicating childbirth: Secondary | ICD-10-CM | POA: Diagnosis not present

## 2016-04-24 DIAGNOSIS — Z3A38 38 weeks gestation of pregnancy: Secondary | ICD-10-CM

## 2016-04-24 DIAGNOSIS — E119 Type 2 diabetes mellitus without complications: Secondary | ICD-10-CM

## 2016-04-24 DIAGNOSIS — O3663X0 Maternal care for excessive fetal growth, third trimester, not applicable or unspecified: Secondary | ICD-10-CM | POA: Diagnosis not present

## 2016-04-24 LAB — GLUCOSE, CAPILLARY: Glucose-Capillary: 178 mg/dL — ABNORMAL HIGH (ref 65–99)

## 2016-04-24 SURGERY — Surgical Case
Anesthesia: Epidural | Wound class: Clean Contaminated

## 2016-04-24 MED ORDER — IBUPROFEN 600 MG PO TABS
600.0000 mg | ORAL_TABLET | Freq: Four times a day (QID) | ORAL | Status: DC
  Administered 2016-04-24: 600 mg via ORAL
  Filled 2016-04-24: qty 1

## 2016-04-24 MED ORDER — TETANUS-DIPHTH-ACELL PERTUSSIS 5-2.5-18.5 LF-MCG/0.5 IM SUSP: 0.5000 mL | Freq: Once | INTRAMUSCULAR | Status: DC

## 2016-04-24 MED ORDER — WITCH HAZEL-GLYCERIN EX PADS: 1.0000 "application " | MEDICATED_PAD | CUTANEOUS | Status: DC | PRN

## 2016-04-24 MED ORDER — SIMETHICONE 80 MG PO CHEW
80.0000 mg | CHEWABLE_TABLET | ORAL | Status: DC
  Administered 2016-04-25 – 2016-04-27 (×4): 80 mg via ORAL
  Filled 2016-04-24 (×4): qty 1

## 2016-04-24 MED ORDER — IBUPROFEN 600 MG PO TABS
600.0000 mg | ORAL_TABLET | Freq: Four times a day (QID) | ORAL | Status: DC
  Administered 2016-04-25 – 2016-04-28 (×14): 600 mg via ORAL
  Filled 2016-04-24 (×14): qty 1

## 2016-04-24 MED ORDER — SIMETHICONE 80 MG PO CHEW: 80.0000 mg | CHEWABLE_TABLET | ORAL | Status: DC | PRN

## 2016-04-24 MED ORDER — MORPHINE SULFATE (PF) 0.5 MG/ML IJ SOLN
INTRAMUSCULAR | Status: DC | PRN
  Administered 2016-04-24: 1 mg via INTRAVENOUS
  Administered 2016-04-24: 4 mg via EPIDURAL

## 2016-04-24 MED ORDER — SENNOSIDES-DOCUSATE SODIUM 8.6-50 MG PO TABS: 2.0000 | ORAL_TABLET | ORAL | Status: DC

## 2016-04-24 MED ORDER — SODIUM BICARBONATE 8.4 % IV SOLN
INTRAVENOUS | Status: DC | PRN
  Administered 2016-04-24 (×4): 5 mL via EPIDURAL

## 2016-04-24 MED ORDER — SIMETHICONE 80 MG PO CHEW
80.0000 mg | CHEWABLE_TABLET | Freq: Three times a day (TID) | ORAL | Status: DC
  Administered 2016-04-25 – 2016-04-28 (×7): 80 mg via ORAL
  Filled 2016-04-24 (×6): qty 1

## 2016-04-24 MED ORDER — COCONUT OIL OIL: 1.0000 "application " | TOPICAL_OIL | Status: DC | PRN

## 2016-04-24 MED ORDER — CEFAZOLIN SODIUM-DEXTROSE 2-3 GM-% IV SOLR
INTRAVENOUS | Status: DC | PRN
  Administered 2016-04-24: 2 g via INTRAVENOUS

## 2016-04-24 MED ORDER — MORPHINE SULFATE (PF) 0.5 MG/ML IJ SOLN
INTRAMUSCULAR | Status: AC
  Filled 2016-04-24: qty 10

## 2016-04-24 MED ORDER — DIPHENHYDRAMINE HCL 25 MG PO CAPS: 25.0000 mg | ORAL_CAPSULE | Freq: Four times a day (QID) | ORAL | Status: DC | PRN

## 2016-04-24 MED ORDER — PRENATAL MULTIVITAMIN CH: 1.0000 | ORAL_TABLET | Freq: Every day | ORAL | Status: DC

## 2016-04-24 MED ORDER — BUPIVACAINE HCL (PF) 0.25 % IJ SOLN
INTRAMUSCULAR | Status: AC
  Filled 2016-04-24: qty 30

## 2016-04-24 MED ORDER — IBUPROFEN 600 MG PO TABS: 600.0000 mg | ORAL_TABLET | Freq: Four times a day (QID) | ORAL | Status: DC

## 2016-04-24 MED ORDER — OXYTOCIN 10 UNIT/ML IJ SOLN
INTRAMUSCULAR | Status: AC
  Filled 2016-04-24: qty 4

## 2016-04-24 MED ORDER — SODIUM CHLORIDE 0.9 % IR SOLN
Status: DC | PRN
  Administered 2016-04-24: 1

## 2016-04-24 MED ORDER — ZOLPIDEM TARTRATE 5 MG PO TABS: 5.0000 mg | ORAL_TABLET | Freq: Every evening | ORAL | Status: DC | PRN

## 2016-04-24 MED ORDER — OXYTOCIN 40 UNITS IN LACTATED RINGERS INFUSION - SIMPLE MED
2.5000 [IU]/h | INTRAVENOUS | Status: AC
Start: 2016-04-24 — End: 2016-04-25

## 2016-04-24 MED ORDER — ACETAMINOPHEN 325 MG PO TABS: 650.0000 mg | ORAL_TABLET | ORAL | Status: DC | PRN

## 2016-04-24 MED ORDER — MENTHOL 3 MG MT LOZG: 1.0000 | LOZENGE | OROMUCOSAL | Status: DC | PRN

## 2016-04-24 MED ORDER — CHLOROPROCAINE HCL (PF) 3 % IJ SOLN
INTRAMUSCULAR | Status: DC | PRN
  Administered 2016-04-24: 10 mL

## 2016-04-24 MED ORDER — SENNOSIDES-DOCUSATE SODIUM 8.6-50 MG PO TABS
2.0000 | ORAL_TABLET | ORAL | Status: DC
  Administered 2016-04-25 – 2016-04-27 (×4): 2 via ORAL
  Filled 2016-04-24 (×4): qty 2

## 2016-04-24 MED ORDER — FENTANYL CITRATE (PF) 100 MCG/2ML IJ SOLN
25.0000 ug | INTRAMUSCULAR | Status: DC | PRN
  Administered 2016-04-24: 25 ug via INTRAVENOUS
  Administered 2016-04-24: 50 ug via INTRAVENOUS

## 2016-04-24 MED ORDER — FENTANYL CITRATE (PF) 100 MCG/2ML IJ SOLN
INTRAMUSCULAR | Status: AC
  Filled 2016-04-24: qty 2

## 2016-04-24 MED ORDER — INSULIN PUMP: 1.0000 | SUBCUTANEOUS | Status: DC

## 2016-04-24 MED ORDER — SIMETHICONE 80 MG PO CHEW: 80.0000 mg | CHEWABLE_TABLET | ORAL | Status: DC

## 2016-04-24 MED ORDER — ACETAMINOPHEN 325 MG PO TABS
ORAL_TABLET | ORAL | Status: AC
  Administered 2016-04-25: 650 mg via ORAL
  Filled 2016-04-24: qty 2

## 2016-04-24 MED ORDER — PRENATAL MULTIVITAMIN CH
1.0000 | ORAL_TABLET | Freq: Every day | ORAL | Status: DC
  Administered 2016-04-25: 1 via ORAL
  Filled 2016-04-24 (×3): qty 1

## 2016-04-24 MED ORDER — ACETAMINOPHEN 325 MG PO TABS
650.0000 mg | ORAL_TABLET | ORAL | Status: DC | PRN
  Administered 2016-04-24 – 2016-04-28 (×13): 650 mg via ORAL
  Filled 2016-04-24 (×15): qty 2

## 2016-04-24 MED ORDER — SIMETHICONE 80 MG PO CHEW: 80.0000 mg | CHEWABLE_TABLET | Freq: Three times a day (TID) | ORAL | Status: DC

## 2016-04-24 MED ORDER — OXYCODONE HCL 5 MG PO TABS
5.0000 mg | ORAL_TABLET | ORAL | Status: DC | PRN
  Administered 2016-04-25 – 2016-04-26 (×2): 5 mg via ORAL
  Filled 2016-04-24 (×4): qty 1

## 2016-04-24 MED ORDER — BUPIVACAINE HCL (PF) 0.25 % IJ SOLN
INTRAMUSCULAR | Status: DC | PRN
Start: 2016-04-24 — End: 2016-04-24
  Administered 2016-04-24: 30 mL

## 2016-04-24 MED ORDER — LACTATED RINGERS IV SOLN: INTRAVENOUS | Status: DC

## 2016-04-24 MED ORDER — LACTATED RINGERS IV SOLN
INTRAVENOUS | Status: DC | PRN
  Administered 2016-04-24: 15:00:00 via INTRAVENOUS

## 2016-04-24 MED ORDER — OXYCODONE HCL 5 MG PO TABS: 5.0000 mg | ORAL_TABLET | ORAL | Status: DC | PRN

## 2016-04-24 MED ORDER — OXYTOCIN 40 UNITS IN LACTATED RINGERS INFUSION - SIMPLE MED: 2.5000 [IU]/h | INTRAVENOUS | Status: DC

## 2016-04-24 MED ORDER — MORPHINE SULFATE (PF) 10 MG/ML IV SOLN: INTRAVENOUS | Status: DC | PRN

## 2016-04-24 MED ORDER — FLUTICASONE PROPIONATE 50 MCG/ACT NA SUSP
1.0000 | Freq: Every day | NASAL | Status: DC
  Filled 2016-04-24: qty 16

## 2016-04-24 MED ORDER — DIBUCAINE 1 % RE OINT: 1.0000 "application " | TOPICAL_OINTMENT | RECTAL | Status: DC | PRN

## 2016-04-24 MED ORDER — OXYCODONE HCL 5 MG PO TABS
10.0000 mg | ORAL_TABLET | ORAL | Status: DC | PRN
  Administered 2016-04-26 – 2016-04-28 (×9): 10 mg via ORAL
  Filled 2016-04-24 (×9): qty 2

## 2016-04-24 MED ORDER — ONDANSETRON HCL 4 MG/2ML IJ SOLN
INTRAMUSCULAR | Status: AC
Start: 2016-04-24 — End: 2016-04-24
  Filled 2016-04-24: qty 2

## 2016-04-24 MED ORDER — OXYCODONE HCL 5 MG PO TABS: 10.0000 mg | ORAL_TABLET | ORAL | Status: DC | PRN

## 2016-04-24 MED ORDER — LACTATED RINGERS IV SOLN
INTRAVENOUS | Status: DC | PRN
  Administered 2016-04-24 (×2): via INTRAVENOUS

## 2016-04-24 SURGICAL SUPPLY — 29 items
BENZOIN TINCTURE PRP APPL 2/3 (GAUZE/BANDAGES/DRESSINGS) ×2 IMPLANT
CHLORAPREP W/TINT 26ML (MISCELLANEOUS) ×2 IMPLANT
CLAMP CORD UMBIL (MISCELLANEOUS) IMPLANT
CLOTH BEACON ORANGE TIMEOUT ST (SAFETY) ×2 IMPLANT
CLSR STERI-STRIP ANTIMIC 1/2X4 (GAUZE/BANDAGES/DRESSINGS) ×2 IMPLANT
DRSG OPSITE POSTOP 4X10 (GAUZE/BANDAGES/DRESSINGS) ×2 IMPLANT
ELECT REM PT RETURN 9FT ADLT (ELECTROSURGICAL) ×2
ELECTRODE REM PT RTRN 9FT ADLT (ELECTROSURGICAL) ×1 IMPLANT
EXTRACTOR VACUUM M CUP 4 TUBE (SUCTIONS) IMPLANT
GLOVE BIOGEL PI IND STRL 7.0 (GLOVE) ×2 IMPLANT
GLOVE BIOGEL PI INDICATOR 7.0 (GLOVE) ×2
GLOVE ECLIPSE 7.0 STRL STRAW (GLOVE) ×4 IMPLANT
GOWN STRL REUS W/TWL LRG LVL3 (GOWN DISPOSABLE) ×4 IMPLANT
KIT ABG SYR 3ML LUER SLIP (SYRINGE) IMPLANT
NEEDLE HYPO 22GX1.5 SAFETY (NEEDLE) ×2 IMPLANT
NEEDLE HYPO 25X5/8 SAFETYGLIDE (NEEDLE) IMPLANT
NS IRRIG 1000ML POUR BTL (IV SOLUTION) ×2 IMPLANT
PACK C SECTION WH (CUSTOM PROCEDURE TRAY) ×2 IMPLANT
PAD ABD 7.5X8 STRL (GAUZE/BANDAGES/DRESSINGS) ×4 IMPLANT
PAD OB MATERNITY 4.3X12.25 (PERSONAL CARE ITEMS) ×2 IMPLANT
PENCIL SMOKE EVAC W/HOLSTER (ELECTROSURGICAL) ×2 IMPLANT
RTRCTR C-SECT PINK 25CM LRG (MISCELLANEOUS) ×2 IMPLANT
SPONGE GAUZE 4X4 12PLY STER LF (GAUZE/BANDAGES/DRESSINGS) ×4 IMPLANT
SUT VIC AB 0 CTX 36 (SUTURE) ×3
SUT VIC AB 0 CTX36XBRD ANBCTRL (SUTURE) ×3 IMPLANT
SUT VIC AB 4-0 KS 27 (SUTURE) ×2 IMPLANT
SYR 30ML LL (SYRINGE) ×2 IMPLANT
TOWEL OR 17X24 6PK STRL BLUE (TOWEL DISPOSABLE) ×2 IMPLANT
TRAY FOLEY CATH SILVER 14FR (SET/KITS/TRAYS/PACK) ×2 IMPLANT

## 2016-04-24 NOTE — Anesthesia Postprocedure Evaluation (Signed)
Anesthesia Post Note  Patient: Frazier ButtStephanie Patino  Procedure(s) Performed: Procedure(s) (LRB): CESAREAN SECTION (N/A)  Patient location during evaluation: PACU Anesthesia Type: Epidural Level of consciousness: awake and alert Pain management: pain level controlled Vital Signs Assessment: post-procedure vital signs reviewed and stable Respiratory status: spontaneous breathing, nonlabored ventilation and respiratory function stable Cardiovascular status: stable and blood pressure returned to baseline Anesthetic complications: no        Last Vitals:  Vitals:   04/24/16 1615 04/24/16 1630  BP: 116/73 118/75  Pulse: (!) 121 (!) 115  Resp: (!) 23 (!) 30  Temp:  (!) 38.4 C    Last Pain:  Vitals:   04/24/16 1630  TempSrc: Axillary  PainSc:    Pain Goal: Patients Stated Pain Goal: 4 (04/23/16 1931)               Lowella CurbWarren Ray Tyeasha Ebbs

## 2016-04-24 NOTE — Progress Notes (Signed)
Pt checked blood sugar on personal monitor. Blood sugar at 184. Gave herself a 3 unit bolus.

## 2016-04-24 NOTE — Progress Notes (Signed)
Vitals:   04/24/16 0100 04/24/16 0130  BP: 139/83   Pulse: (!) 118   Resp:    Temp: 100.3 F (37.9 C) 100 F (37.8 C)   Comfortable w/epidrual. BS doing fine.  cx small rim/-1. FHR Cat 1.   Anticipate SVD.

## 2016-04-24 NOTE — Op Note (Signed)
Cesarean Section Operative Report  Frazier ButtStephanie Hainline  04/21/2016 - 04/24/2016  Indications: failure to progress   Pre-operative Diagnosis: failure to progress   Post-operative Diagnosis: Same   Surgeon: Surgeon(s) and Role:    * Reva Boresanya S Pratt, MD     * Lorne SkeensNicholas Michael Schenk, MD   Assistants: none  Anesthesia: epidural    Estimated Blood Loss: 800 ml  Specimens: none  Findings: Viable female infant in occiput posterior presentation; Apgars 8 and 9; arterial cord pH not sent; clear amniotic fluid; intact placenta with three vessel cord; normal uterus, fallopian tubes and ovaries bilaterally.  Baby condition / location:  Couplet care / Skin to Skin   Complications: no complications  Indications: Frazier ButtStephanie Sandner is a 31 y.o. G1P1001 with an IUP 4247w0d presenting  For IOL for DM and cHTN. Patient had 8/10 BPP on day of admission  Procedure Details:  The patient was taken back to the operative suite where epidural anesthesia was bolused to surgical level.  A time out was held and the above information confirmed.   After induction of anesthesia, the patient was draped and prepped in the usual sterile manner and placed in a dorsal supine position with a leftward tilt. A Pfannenstiel incision was made and carried down through the subcutaneous tissue to the fascia. Fascial incision was made and sharply extended transversely. The fascia was separated from the underlying rectus tissue superiorly and inferiorly. The peritoneum was identified and bluntly entered and extended longitudinally. Alexis retractor was placed. A low transverse uterine incision was made and extended bluntly. Delivered from cephalic presentation was a viable infant with Apgars as above.  After waiting 60 seconds for delayed cord cutting, the umbilical cord was clamped and cut cord blood was obtained for evaluation. Cord ph was not sent. The placenta was removed Intact and appeared normal. The uterine outline, tubes and  ovaries appeared normal. The uterine incision was closed with running locked sutures of 0Vicryl with an imbricating layer of the same. A small extension was noted on the patients left side that was repaired with the rest of the hysterotomy.   Hemostasis was observed. The peritoneum was closed with 0-vircryl. The rectus muscles were examined and hemostasis observed. The fascia was then reapproximated with running sutures of 0Vicryl. A total of 30 ml 0.5% Marcaine was injected subcutaneously at the margins of the incision. The subcuticular closure was performed using plain gut. The skin was closed with 3-0Vicryl.   Instrument, sponge, and needle counts were correct prior the abdominal closure and were correct at the conclusion of the case.     Disposition: PACU - hemodynamically stable.       SignedLes Pou: Nicholas SchenkMD 04/24/2016 3:56 PM

## 2016-04-24 NOTE — Progress Notes (Signed)
   Valerie Dennis is a 31 y.o. G1P0 at 6720w0d  admitted for induction of labor due to Diabetes and Hypertension.  Subjective:   Objective: Vitals:   04/24/16 0531 04/24/16 0600 04/24/16 0601 04/24/16 0602  BP: 133/80   131/73  Pulse: (!) 122 (!) 105 (!) 105 (!) 125  Resp:      Temp: 99.8 F (37.7 C)     TempSrc: Axillary     SpO2:      Weight:      Height:       Total I/O In: -  Out: 450 [Urine:450]  FHT:  FHR: 150 bpm, variability: moderate,  accelerations:  Present,  decelerations:  Present occ mild variable/late UC:   irregular, every 5-8 minutes,  Ctx have been spacing out over the past few hours despite increasing ptiocin  Pt has been "laboring down" since around 0200, but ctx are now only q 8 -10 minutes.  Has been pushing w/those ctx for about an hournutes, but of course, that has only been 9 total pushes.  Baby at 0/+1 station, feels as if there is enough room.  SVE:   Dilation: 10 Effacement (%): 100 Station: 0 Exam by:: amwalker,rn Pitocin @ 22 mu/min  Labs: Lab Results  Component Value Date   WBC 11.9 (H) 04/23/2016   HGB 13.7 04/23/2016   HCT 40.2 04/23/2016   MCV 89.5 04/23/2016   PLT 156 04/23/2016    Assessment / Plan: Protracted active phase Will continue to increase pitocin, push with each contraction (pt says upper back hurst when she pushes, so she is somewhat reluctant to).  Labor: protracted Fetal Wellbeing:  Category I Pain Control:  Epidural Anticipated MOD:  NSVD  Valerie Dennis 04/24/2016, 6:35 AM

## 2016-04-24 NOTE — Progress Notes (Signed)
Blood sugar 219 on patients monitor. Bolused 5 units

## 2016-04-24 NOTE — Progress Notes (Signed)
Per Pincus BadderK Shaw MD it is okay to use patient's insulin pump for monitoring of sugars.  We do not need to use hospital CBG machines. Will continue to monitor.

## 2016-04-24 NOTE — Interval H&P Note (Signed)
History and Physical Interval Note:  04/24/2016 2:04 PM  Valerie Dennis  has presented today for surgery, with the diagnosis of Arrest of dilation. Her FHR is completely normal. She desires to continue her pump during surgery. The various methods of treatment have been discussed with the patient and family. After consideration of risks, benefits and other options for treatment, the patient has consented to  Procedure(s): CESAREAN SECTION (N/A) as a surgical intervention .  The patient's history has been reviewed, patient examined, no change in status, stable for surgery.  I have reviewed the patient's chart and labs.  Questions were answered to the patient's satisfaction.     Reva Boresanya S Pratt

## 2016-04-24 NOTE — Progress Notes (Signed)
Patient ID: Frazier ButtStephanie Setter, female   DOB: Apr 05, 1985, 31 y.o.   MRN: [id number]  Pushing x 1.5hrs now, using rope BP 144/90, other VSS FHR 130s, +accels, no decels Ctx q 4 mins w/ Pit @ 2030mu/min Cx: vtx at +1, caput (started pushing at 0/+1)  IUP@term  cHTN DM-1 End 1st stage  Will push x 1 more hour and then eval Expressed to pt and family that I am guarded re vag del  EqualitySHAW, Cala BradfordKIMBERLY 04/24/2016 12:12 PM

## 2016-04-24 NOTE — Transfer of Care (Signed)
Immediate Anesthesia Transfer of Care Note  Patient: Valerie ButtStephanie Dennis  Procedure(s) Performed: Procedure(s): CESAREAN SECTION (N/A)  Patient Location: PACU  Anesthesia Type:Epidural  Level of Consciousness: awake, alert  and oriented  Airway & Oxygen Therapy: Patient Spontanous Breathing  Post-op Assessment: Report given to RN and Post -op Vital signs reviewed and stable  Post vital signs: Reviewed and stable  Last Vitals:  Vitals:   04/24/16 1329 04/24/16 1330  BP: (!) 150/90 (!) 150/90  Pulse: (!) 103 (!) 103  Resp: 18 18  Temp:      Last Pain:  Vitals:   04/24/16 1053  TempSrc: Oral  PainSc:       Patients Stated Pain Goal: 4 (04/23/16 1931)  Complications: No apparent anesthesia complications

## 2016-04-24 NOTE — Progress Notes (Signed)
Pt into PACU. Blood sugar 125 on patients personal monitor. CRNA of with using patients monitor for results.

## 2016-04-24 NOTE — Lactation Note (Signed)
This note was copied from a baby's chart. Lactation Consultation Note  Patient Name: Valerie Frazier ButtStephanie Keagle AOZHY'QToday's Date: 04/24/2016 Reason for consult: Initial assessment  Assisted in PACU with first baby of Type 1 Diabetic.  Baby born at 39 wks weighing 8 lbs 7.3 oz.   Baby latched onto left breast in laid back position and fed on and off totaling 10 mins.   Mom has flat nipples, areola compressible, colostrum easy to express. Baby too fussy and sleep acting to latch onto right side. LC hand expressed with permission, and spoon fed baby 5 ml of colostrum. Mom not feeling well in PACU, slight temperature. Told Mom that she is to ask for assistance as needed when back on MBU.  Mom aware of IP lactation support available Encouraged continued STS, and feeding often on cue.  Encouraged hand expression to spoon feed baby colostrum. Brochure given to Mom.  Follow up prn and every day.    Consult Status Consult Status: Follow-up Date: 04/25/16 Follow-up type: In-patient    Judee ClaraSmith, Bonnetta Allbee E 04/24/2016, 4:41 PM

## 2016-04-25 LAB — CBC
HCT: 30.2 % — ABNORMAL LOW (ref 36.0–46.0)
Hemoglobin: 10.6 g/dL — ABNORMAL LOW (ref 12.0–15.0)
MCH: 31.2 pg (ref 26.0–34.0)
MCHC: 35.1 g/dL (ref 30.0–36.0)
MCV: 88.8 fL (ref 78.0–100.0)
Platelets: 128 10*3/uL — ABNORMAL LOW (ref 150–400)
RBC: 3.4 MIL/uL — ABNORMAL LOW (ref 3.87–5.11)
RDW: 13.7 % (ref 11.5–15.5)
WBC: 17.4 10*3/uL — ABNORMAL HIGH (ref 4.0–10.5)

## 2016-04-25 MED ORDER — MEASLES, MUMPS & RUBELLA VAC ~~LOC~~ INJ
0.5000 mL | INJECTION | Freq: Once | SUBCUTANEOUS | Status: DC
  Filled 2016-04-25: qty 0.5

## 2016-04-25 NOTE — Progress Notes (Addendum)
POSTOPERATIVE DAY # 1 S/P CS    S:         Reports feeling good             Tolerating po intake / no nausea / no vomiting / + flatus / no BM             Bleeding is light             Pain controlled with ibuprofen (OTC) and percocet             Up ad lib / ambulatory/ not voiding- foley removed 2hrs ago   Newborn breast and bottle feeding    O:  VS: BP (!) 89/70 (BP Location: Left Arm)   Pulse 93   Temp 98.3 F (36.8 C)   Resp 20   Ht 5\' 4"  (1.626 m)   Wt 102.1 kg (225 lb)   LMP 07/26/2015 (Exact Date)   SpO2 99%   Breastfeeding? Unknown   BMI 38.62 kg/m    LABS:               Recent Labs  04/23/16 1711 04/25/16 0600  WBC 11.9* 17.4*  HGB 13.7 10.6*  PLT 156 128*               Bloodtype: --/--/O POS, O POS (03/13 2255)  Rubella: 0.96 (08/14 1642)                                             I&O: Intake/Output      03/16 0701 - 03/17 0700 03/17 0701 - 03/18 0700   I.V. (mL/kg) 2200 (21.5)    Total Intake(mL/kg) 2200 (21.5)    Urine (mL/kg/hr) 2500 (1)    Blood 800 (0.3)    Total Output 3300     Net -1100                       Physical Exam:             Alert and Oriented X3  Lungs: Clear and unlabored  Heart: regular rate and rhythm / no mumurs  Abdomen: soft, non-tender, non-distended              Fundus: firm, non-tender, U/1             Dressing pressure dressing in place               Incision:  approximated with sutures covered by pressure dressing   Perineum: intact  Lochia: light  Extremities: no Homans sign, mild pedal edema, no calf pain or tenderness  A:        POD # 1 S/P CS            Stable status   P:        Routine postoperative care                   Steward Drone BSN, SNM  04/25/2016, 7:59 AM  CNM attestation Post Partum Day #1  Valerie Dennis is a 31 y.o. G1P1001 s/p pLTCS.  Pt denies problems with ambulating or po intake; has not voided yet since foley removed. Pain is well controlled.  Plan for birth control is IUD.   Method of Feeding: both Type 1 DM- managed per pt using pump Tmax 101.3 while in PACU-  afebrile since  PE:  BP (!) 89/70 (BP Location: Left Arm)   Pulse 93   Temp 98.3 F (36.8 C)   Resp 20   Ht 5\' 4"  (1.626 m)   Wt 102.1 kg (225 lb)   LMP 07/26/2015 (Exact Date)   SpO2 99%   Breastfeeding? Unknown   BMI 38.62 kg/m  Fundus firm   Plan for discharge: 04/26/16  Cam HaiSHAW, Deshaun Weisinger, CNM 9:25 AM  04/25/2016

## 2016-04-25 NOTE — Anesthesia Postprocedure Evaluation (Signed)
Anesthesia Post Note  Patient: Valerie ButtStephanie Larabee  Procedure(s) Performed: Procedure(s) (LRB): CESAREAN SECTION (N/A)  Patient location during evaluation: Mother Baby Anesthesia Type: Epidural Level of consciousness: awake and alert Pain management: pain level controlled Vital Signs Assessment: post-procedure vital signs reviewed and stable Respiratory status: spontaneous breathing, nonlabored ventilation and respiratory function stable Cardiovascular status: stable Postop Assessment: no headache, no backache, epidural receding and patient able to bend at knees Anesthetic complications: no        Last Vitals:  Vitals:   04/25/16 0100 04/25/16 0558  BP: (!) 99/39 (!) 89/70  Pulse: (!) 104 93  Resp: 18 20  Temp: 36.9 C 36.8 C    Last Pain:  Vitals:   04/25/16 0730  TempSrc:   PainSc: 0-No pain   Pain Goal: Patients Stated Pain Goal: 4 (04/23/16 1931)               Rica RecordsICKELTON,Pearse Shiffler

## 2016-04-25 NOTE — Lactation Note (Addendum)
This note was copied from a baby's chart. Lactation Consultation Note; Called to assist mom with latch. Baby very fussy. Morrie Sheldonshley RN has been assisting mom but she won't stay on the breast. When sucking on my finger, she is doing some tongue thrusting and biting. Very fussy. RN assisedt with mom with hand expression and spoon feeding. Nipples slightly flat but do erect some with stimulation.Tried #20 NS. She did stay a little longer but after a few minutes mom reports pain and nipple looks pinched. Tried with #24 NS and she did some better. Tried on other breast with and without NS. She did a few more minutes but then getting sleepy. Reviewed feeding cues and encouraged to feed whenever she sees them, Baby now 25 hours old. Reviewed cluster feeding the second night. No questions at present. To call for assist prn  Patient Name: Valerie Dennis UJWJX'BToday's Date: 04/25/2016 Reason for consult: Follow-up assessment   Maternal Data    Feeding Feeding Type: Breast Fed  LATCH Score/Interventions Latch: Repeated attempts needed to sustain latch, nipple held in mouth throughout feeding, stimulation needed to elicit sucking reflex.  Audible Swallowing: A few with stimulation  Type of Nipple: Everted at rest and after stimulation  Comfort (Breast/Nipple): Soft / non-tender     Hold (Positioning): Assistance needed to correctly position infant at breast and maintain latch.  LATCH Score: 7  Lactation Tools Discussed/Used Tools: Nipple Shields Nipple shield size: 20;24   Consult Status Consult Status: Follow-up    Valerie Dennis, Valerie Dennis 04/25/2016, 3:52 PM

## 2016-04-25 NOTE — Addendum Note (Signed)
Addendum  created 04/25/16 1159 by Rica RecordsAngela Tennyson Wacha, CRNA   Sign clinical note

## 2016-04-26 NOTE — Lactation Note (Addendum)
This note was copied from a baby's chart. Lactation Consultation Note Initial visit at 53 hours of age. Serum bili has dropped slightly.   LC visited to assist with SNS feeding using 5 fr feeding tube with NS.  Parents are more independent with feeding with SN placed on outside of NS.  NS falls off when 5 fr feeding tube is under NS.  Mom has large soft breasts with NS difficult to apply, although mom can demonstrate proper application.   Baby took 32 mls total with stimulation needed to maintain sucking.  Baby can pull some from syringe and FOB aware of how to help with feeding and demonstrated some of this feeding.  Baby maintained sucking for about 20 minutes of total feeding time.   Mom to post pump and offer EBM to baby with next feeding. Parents report confidence with feeding plan and LC reported to bedside RN, Terri. Baby very satisfied after feeding.   Parents aware of importance to wake baby every 2 1/2 hours or feed sooner with early feeding cues.    Patient Name: Valerie Dennis ZOXWR'UToday's Date: 04/26/2016 Reason for consult: Follow-up assessment   Maternal Data Has patient been taught Hand Expression?: Yes  Feeding Feeding Type: Breast Fed  LATCH Score/Interventions Latch: Repeated attempts needed to sustain latch, nipple held in mouth throughout feeding, stimulation needed to elicit sucking reflex. Intervention(s): Adjust position;Assist with latch;Breast massage;Breast compression  Audible Swallowing: Spontaneous and intermittent (formula supplement only no transfer of colostrum )  Type of Nipple: Flat Intervention(s): Double electric pump  Comfort (Breast/Nipple): Soft / non-tender     Hold (Positioning): Assistance needed to correctly position infant at breast and maintain latch. Intervention(s): Breastfeeding basics reviewed;Support Pillows;Position options;Skin to skin  LATCH Score: 7  Lactation Tools Discussed/Used Tools: 71F feeding tube /  Syringe Nipple shield size: 20 Pump Review: Setup, frequency, and cleaning;Milk Storage Initiated by:: JS IBCLC reviewed Date initiated:: 04/26/16   Consult Status Consult Status: Follow-up Date: 04/27/16 Follow-up type: In-patient    Beverely RisenShoptaw, Arvella MerlesJana Lynn 04/26/2016, 7:58 PM

## 2016-04-26 NOTE — Lactation Note (Signed)
This note was copied from a baby's chart. Lactation Consultation Note RN requested LC to see pt. Due to increased bili level and Dr. Herold Harmsequesting baby to be SNS/Bottle fed and supplemented.  RN was assisting FOB with syringe feeding of 25 mls alimentum formula. Mom sitting in bed with DEBP and collected few drop applied to baby's mouth by FOB. Baby tolerated feeding well and parents reports baby seems to be doing better now.  LC explained to parents need to wake baby every 2 1/2 hours of feed with early cues sooner to get feedings started,  Mom does not feel baby has been feeding well at the breast even with using NS.  LC discussed baby may not continue to do well with SNS, but we can try at next feeding and parents are interested.  LC set up feeding catheter and syringe combo for SNS supplementation  For next feeding. Mom returned demonstration of NS application and set up of SNS.  Parents receptive to teaching and plan to call Rn or LC for next feeding. Mom is concerned about low milk supply and LC encouraged mom to increase pumping and hand expression and explained to mom it is to early to know and mom is aware of o/p services. LC noted mom to have significant pedal edema. FOB holding sleeping baby.  All questions answered.    Patient Name: Girl Frazier ButtStephanie Wachter ZOXWR'UToday's Date: 04/26/2016 Reason for consult: Follow-up assessment;Difficult latch;Infant weight loss;Hyperbilirubinemia   Maternal Data Has patient been taught Hand Expression?: Yes  Feeding Feeding Type: Formula  LATCH Score/Interventions                      Lactation Tools Discussed/Used     Consult Status Consult Status: Follow-up Date: 04/27/16 Follow-up type: In-patient    Jannifer RodneyShoptaw, Jana Lynn 04/26/2016, 5:03 PM

## 2016-04-26 NOTE — Progress Notes (Signed)
Post Partum Day #2 Subjective: no complaints, up ad lib, voiding, tolerating PO and feeling very sore, baby's not feeding well. She does report that she has her insulin pump set to Auto and her sugars are running in the 120s.  Objective: Blood pressure 125/78, pulse 79, temperature 98.9 F (37.2 C), resp. rate 20, height 5\' 4"  (1.626 m), weight 102.1 kg (225 lb), last menstrual period 07/26/2015, SpO2 98 %, unknown if currently breastfeeding.  Physical Exam:  General: alert Lochia: appropriate Uterine Fundus: firm Incision: dressing c/d/i DVT Evaluation: No evidence of DVT seen on physical exam.   Recent Labs  04/23/16 1711 04/25/16 0600  HGB 13.7 10.6*  HCT 40.2 30.2*    Assessment/Plan: Plan for discharge tomorrow   LOS: 5 days   Allie BossierMyra C Sherry Blackard 04/26/2016, 8:46 AM

## 2016-04-26 NOTE — Lactation Note (Signed)
This note was copied from a baby's chart. Lactation Consultation Note     Consult with this first time mom and term baby, now 4842 hours old. Baby has not been able to maintain latch, and mom report baby "biting" her nipples. On exam of baby's mouth, she has a posterior short lingual frenulum, with thick tissue behind the frenulum. She also has an upper lip frenulum that extends to the gum line. I showed my findings to the parents, and suggested that these could be an oral restriction, making it difficult for baby to latch and stay latched. I reviewed with mom and dad how to apply 20 nipple shield, which fit well,  And how to advance the baby beyond the nipple, and some breast movement was seen with suckles, and latch more comfortable for mom. I then fed the baby 10 ml's of formula ( Alimentum), with a syringe, behind the shield, which she tolerated well.  Mom and baby's nurse, Valerie Dennis, asked me to see this dyad. I fitted mom with 21 flanges, which I gave to Valerie Sheldonshley to have mom try, and also gave her the oral restriction list to give to the parents, which I had already explained to them. I will follow up with this dyad later.   Patient Name: Valerie Dennis Valerie Dennis     Maternal Data    Feeding Feeding Type: Breast Fed  LATCH Score/Interventions Latch: Grasps breast easily, tongue down, lips flanged, rhythmical sucking. (baby advanced deeply beyond the nipple shield) Intervention(s): Adjust position;Assist with latch;Breast compression  Audible Swallowing: A few with stimulation  Type of Nipple: Flat Intervention(s): Double electric pump  Comfort (Breast/Nipple): Filling, red/small blisters or bruises, mild/mod discomfort  Problem noted: Mild/Moderate discomfort Interventions (Mild/moderate discomfort): Post-pump  Hold (Positioning): Assistance needed to correctly position infant at breast and maintain latch. Intervention(s): Breastfeeding basics reviewed;Support  Pillows;Position options;Skin to skin  LATCH Score: 6  Lactation Tools Discussed/Used Tools: Nipple Shields Nipple shield size: 20   Consult Status Consult Status: Follow-up Date: 04/27/16 Follow-up type: In-patient    Valerie Dennis, Valerie Dennis Anne Dennis, 9:39 AM

## 2016-04-27 LAB — CBC
HCT: 28.3 % — ABNORMAL LOW (ref 36.0–46.0)
Hemoglobin: 9.8 g/dL — ABNORMAL LOW (ref 12.0–15.0)
MCH: 31.1 pg (ref 26.0–34.0)
MCHC: 34.6 g/dL (ref 30.0–36.0)
MCV: 89.8 fL (ref 78.0–100.0)
Platelets: 189 10*3/uL (ref 150–400)
RBC: 3.15 MIL/uL — ABNORMAL LOW (ref 3.87–5.11)
RDW: 13.8 % (ref 11.5–15.5)
WBC: 9.2 10*3/uL (ref 4.0–10.5)

## 2016-04-27 LAB — COMPREHENSIVE METABOLIC PANEL
ALT: 46 U/L (ref 14–54)
AST: 65 U/L — ABNORMAL HIGH (ref 15–41)
Albumin: 2 g/dL — ABNORMAL LOW (ref 3.5–5.0)
Alkaline Phosphatase: 109 U/L (ref 38–126)
Anion gap: 7 (ref 5–15)
BUN: 9 mg/dL (ref 6–20)
CO2: 25 mmol/L (ref 22–32)
Calcium: 8.2 mg/dL — ABNORMAL LOW (ref 8.9–10.3)
Chloride: 108 mmol/L (ref 101–111)
Creatinine, Ser: 0.8 mg/dL (ref 0.44–1.00)
GFR calc Af Amer: >60 mL/min (ref >60)
GFR calc non Af Amer: >60 mL/min (ref >60)
Glucose, Bld: 81 mg/dL (ref 65–99)
Potassium: 3.6 mmol/L (ref 3.5–5.1)
Sodium: 140 mmol/L (ref 135–145)
Total Bilirubin: 0.4 mg/dL (ref 0.3–1.2)
Total Protein: 5.3 g/dL — ABNORMAL LOW (ref 6.5–8.1)

## 2016-04-27 LAB — PROTEIN / CREATININE RATIO, URINE
Creatinine, Urine: 18 mg/dL
Total Protein, Urine: <6 mg/dL

## 2016-04-27 MED ORDER — IBUPROFEN 600 MG PO TABS: 600.0000 mg | ORAL_TABLET | Freq: Four times a day (QID) | ORAL | 0 refills | Status: DC

## 2016-04-27 MED ORDER — PRENATAL MULTIVITAMIN CH
1.0000 | ORAL_TABLET | Freq: Every day | ORAL | 11 refills | Status: DC
Start: 2016-04-27 — End: 2018-07-14

## 2016-04-27 MED ORDER — SENNOSIDES-DOCUSATE SODIUM 8.6-50 MG PO TABS: 2.0000 | ORAL_TABLET | ORAL | 1 refills | Status: DC

## 2016-04-27 MED ORDER — OXYCODONE HCL 5 MG PO TABS
5.0000 mg | ORAL_TABLET | ORAL | 0 refills | Status: DC | PRN
Start: 2016-04-27 — End: 2016-05-19

## 2016-04-27 MED ORDER — AMLODIPINE BESYLATE 5 MG PO TABS
5.0000 mg | ORAL_TABLET | Freq: Every day | ORAL | Status: DC
  Filled 2016-04-27: qty 1

## 2016-04-27 MED ORDER — MAGNESIUM SULFATE 40 G IN LACTATED RINGERS - SIMPLE
2.0000 g/h | INTRAVENOUS | Status: DC
  Filled 2016-04-27: qty 500

## 2016-04-27 MED ORDER — MAGNESIUM SULFATE BOLUS VIA INFUSION
4.0000 g | Freq: Once | INTRAVENOUS | Status: DC
  Filled 2016-04-27: qty 500

## 2016-04-27 MED ORDER — METHYLDOPA 250 MG PO TABS
250.0000 mg | ORAL_TABLET | Freq: Two times a day (BID) | ORAL | Status: DC
  Administered 2016-04-27 – 2016-04-28 (×3): 250 mg via ORAL
  Filled 2016-04-27 (×7): qty 1

## 2016-04-27 MED FILL — SENEXON-S TABLET: 8.6-50 | 50 days supply | Qty: 100 | Fill #0

## 2016-04-27 MED FILL — IBUPROFEN 600 MG TABLET: 600 | 8 days supply | Qty: 30 | Fill #0

## 2016-04-27 NOTE — Progress Notes (Signed)
Patient ID: Valerie Dennis, female   DOB: [birth year], 31 y.o.   MRN: [id number]  POSTPARTUM PROGRESS NOTE  Post Op Day #3  Subjective:  Valerie Dennis is a 31 y.o. G1P1001 2820w0d s/p PLTCS.  No acute events overnight.  Pt denies problems with ambulating, voiding or po intake.  She denies nausea or vomiting.  Pain is moderately controlled.  She has had flatus. She has had bowel movement.  Lochia Minimal. She was complaining of some vision concerns, with flashes of light throughout the day. They have since resolved. Discussed with patient due to concern for possible preeclampsia and when her vision concerns resolved (once her BP was controlled), it was late evening. Will keep overnight and discharge in the morning. Reviewed lab results and diagnosis with patient.  Objective: Blood pressure 134/66, pulse 67, temperature 98.2 F (36.8 C), temperature source Oral, resp. rate 18, height 5\' 4"  (1.626 m), weight 225 lb (102.1 kg), last menstrual period 07/26/2015, SpO2 98 %, unknown if currently breastfeeding.  Physical Exam:  General: alert, cooperative and no distress Lochia:normal flow Chest: CTAB Heart: RRR no m/r/g Abdomen: +BS, soft, nontender Incision: Clean/dry intact Uterine Fundus: firm, below umbilicus;   DVT Evaluation: No calf swelling or tenderness Extremities: Trace edema equally bilaterally   Recent Labs  04/25/16 0600 04/27/16 1208  HGB 10.6* 9.8*  HCT 30.2* 28.3*    Assessment/Plan:  ASSESSMENT: Valerie Dennis is a 31 y.o. G1P1001 8220w0d s/p PLTCS  # Type I diabetes, on insulin pump, controlled # Chronic HTN, restarted medication, patient preferred methyldopa over Norvasc (per patient refused Norvasc due to she "knows" that methlydopa works for her) Routine post operative/delivery care Plan for discharge tomorrow    LOS: 6 days   Jen MowElizabeth Afia Messenger, DO [facility] Fellow [facility]  04/27/2016, 10:16 PM

## 2016-04-27 NOTE — Plan of Care (Signed)
Problem: Education: Goal: Knowledge of condition will improve See previous note about patient vision complaints. MD assessing patient, ordered to start blood pressure medication and labs and recheck blood pressures every 2-3 hours.   Problem: Coping: Goal: Ability to identify and utilize available resources and services will improve Outcome: Completed/Met Date Met: 04/27/16 Lactation working with patient assisting with plan for feedings and supplementation. PT has spoken with patient and to have patient call for next feeding for PT to assess baby's feeding pattern.

## 2016-04-27 NOTE — Lactation Note (Signed)
This note was copied from a baby's chart. Lactation Consultation Note  Patient Name: Valerie Frazier ButtStephanie Codrington ZOXWR'UToday's Date: 04/27/2016 Reason for consult: Follow-up assessment;Infant weight loss Baby is 370 hours old , and dad woke the baby up, it had been 3 hours. Wet diaper noted.  LC assisted with baby latching on the right side,football position, mom applied the Nipple shield,  And it was loose , so the LC reapplied it, and at 1st the baby was latching on and off and LC was unable to insert the 21F SNS in the side of the mouth. . LC instilled formula in the top of the NS , applied the 21F SNS on the top of the NS. And baby latched , alittle sluggish at time.  LC un- latched the baby , burped her, massaged the baby's back , and she woke up better.  Baby re-latched and ate 20 mins, and took 33 ml of formula.  Baby satisfied after feeding.  LC reviewed supply and demand. And the importance of consistent pumping after feedings to enhance  Establishing  Milk supply. LC stressed the importance of the pumping due to her excessive swelling, Hypertension, and use of the Nipple Shield. Sore nipple and engorgement prevention and tx reviewed.  Mom has her DEBP Medela - LC explained the set up.  [facility]. Baby has not been D/C yet so the referral has not populated .  LC asked mom [facility].  [facility]    Maternal Data Has patient been taught Hand Expression?: Yes  Feeding Feeding Type: Breast Fed Length of feed: 20 min  LATCH Score/Interventions Latch: Repeated attempts needed to sustain latch, nipple held in mouth throughout feeding, stimulation needed to elicit sucking reflex. Intervention(s): Adjust position;Assist with latch;Breast massage;Breast compression  Audible Swallowing: Spontaneous and intermittent (with SNS )  Type of Nipple: Everted at rest and after stimulation  Comfort  (Breast/Nipple): Soft / non-tender     Hold (Positioning): Assistance needed to correctly position infant at breast and maintain latch. Intervention(s): Breastfeeding basics reviewed;Support Pillows;Position options;Skin to skin  LATCH Score: 8  Lactation Tools Discussed/Used Tools: Nipple Shields;21F feeding tube / Syringe Nipple shield size: 20   Consult Status Consult Status: Follow-up Date: 04/27/16 Follow-up type: In-patient    Matilde SprangMargaret Ann Jeannett Dekoning 04/27/2016, 1:25 PM

## 2016-04-27 NOTE — Progress Notes (Signed)
Upon assessment of patient, patient states that since last evening she has noticed flashes of light in her vision. She compares it to a strobe light and upon looking at movement, it's as if the movement is choppy instead of smooth. Patient states that she thought it was just "bad lighting flickering" in the room or lack of sleep last night. Patient reported this to her RN overnight and stated that the RN instructed her to tell her doctor in the morning. Patient forgot to mention it to the discharging midwife. Patient has no complaints of headache or right upper abdominal discomfort, has no clonus, reflexes are +2. Patient has no dizziness. Blood pressure 143/80. Notified Dr. Germaine PomfretMcMullin who stated he would be by to assess patient. Earl Galasborne, Linda HedgesStefanie Rocky RiverHudspeth

## 2016-04-27 NOTE — Discharge Summary (Signed)
OB Discharge Summary  Patient Name: Valerie Dennis DOB: 1985/03/25 MRN: [id number]  Date of admission: 04/21/2016 Delivering MD: Reva BoresPRATT, TANYA S   Date of discharge: 04/27/2016  Admitting diagnosis: 38.5WKS MONITORING Intrauterine pregnancy: 4855w0d     Secondary diagnosis:Active Problems:   Indication for care in labor and delivery, antepartum  Additional problems:none     Discharge diagnosis: Term Pregnancy Delivered and Type 2 DM                                                                     Post partum procedures:n/a  Augmentation: Pitocin  Complications: None  Hospital course:  Onset of Labor With Unplanned C/S  31 y.o. yo G1P1001 at 7355w0d was admitted in Active Labor on 04/21/2016. Patient had a labor course significant for failure to descend . Membrane Rupture Time/Date: 2:40 PM ,04/23/2016   The patient went for cesarean section due to Arrest of Descent, and delivered a Viable infant,04/24/2016  Details of operation can be found in separate operative note. Patient had an uncomplicated postpartum course.  She is ambulating,tolerating a regular diet, passing flatus, and urinating well.  Patient is discharged home in stable condition 04/27/16.  Physical exam  Vitals:   04/25/16 1223 04/25/16 1858 04/26/16 0500 04/26/16 1900  BP: 109/67 125/66 125/78 136/79  Pulse: 91 91 79 85  Resp:  16 20 18   Temp: 98.5 F (36.9 C) 97.9 F (36.6 C) 98.9 F (37.2 C) 98.1 F (36.7 C)  TempSrc: Oral Oral  Oral  SpO2:  98%  100%  Weight:      Height:       General: alert, cooperative and no distress Lochia: appropriate Uterine Fundus: firm Incision: Healing well with no significant drainage, No significant erythema, Dressing is clean, dry, and intact DVT Evaluation: No evidence of DVT seen on physical exam. Labs: Lab Results  Component Value Date   WBC 17.4 (H) 04/25/2016   HGB 10.6 (L) 04/25/2016   HCT 30.2 (L) 04/25/2016   MCV 88.8 04/25/2016   PLT 128 (L)  04/25/2016   CMP Latest Ref Rng & Units 04/21/2016  Glucose 65 - 99 mg/dL 782(N102(H)  BUN 6 - 20 mg/dL 13  Creatinine 5.620.44 - 1.301.00 mg/dL 8.650.62  Sodium 784135 - 696145 mmol/L 135  Potassium 3.5 - 5.1 mmol/L 3.6  Chloride 101 - 111 mmol/L 104  CO2 22 - 32 mmol/L 19(L)  Calcium 8.9 - 10.3 mg/dL 9.1  Total Protein 6.5 - 8.1 g/dL 6.2(L)  Total Bilirubin 0.3 - 1.2 mg/dL 0.3  Alkaline Phos 38 - 126 U/L 138(H)  AST 15 - 41 U/L 26  ALT 14 - 54 U/L 18    Discharge instruction: per After Visit Summary and "Baby and Me Booklet".  After Visit Meds:  Allergies as of 04/27/2016      Reactions   Amoxicillin Rash   Has patient had a PCN reaction causing immediate rash, facial/tongue/throat swelling, SOB or lightheadedness with hypotension: no Has patient had a PCN reaction causing severe rash involving mucus membranes or skin necrosis: yes Has patient had a PCN reaction that required hospitalization no Has patient had a PCN reaction occurring within the last 10 years: no If all of the above answers  are "NO", then may proceed with Cephalosporin use.      Medication List    TAKE these medications   BAYER CONTOUR NEXT TEST test strip Generic drug:  glucose blood   ibuprofen 600 MG tablet Commonly known as:  ADVIL,MOTRIN Take 1 tablet (600 mg total) by mouth every 6 (six) hours.   insulin lispro 100 UNIT/ML KiwkPen Commonly known as:  HUMALOG as needed   insulin pump Soln Inject 1 each into the skin continuous. Humalog insulin in pump, up to 150 units per day   mometasone 50 MCG/ACT nasal spray Commonly known as:  NASONEX Place 2 sprays into the nose daily.   oxyCODONE 5 MG immediate release tablet Commonly known as:  Oxy IR/ROXICODONE Take 1 tablet (5 mg total) by mouth every 4 (four) hours as needed (pain scale 4-7).   prenatal multivitamin Tabs tablet Take 1 tablet by mouth daily at 12 noon. What changed:  medication strength   senna-docusate 8.6-50 MG tablet Commonly known as:   Senokot-S Take 2 tablets by mouth daily. Start taking on:  04/28/2016       Diet: routine diet  Activity: Advance as tolerated. Pelvic rest for 6 weeks.   Outpatient follow up:6 weeks Follow up Appt:Future Appointments Date Time Provider Department Center  05/19/2016 1:30 PM Yetta Glassman, RN [facility] None   Follow up visit: No Follow-up on file.  Postpartum contraception: Condoms  Newborn Data: Live born female  Birth Weight: 8 lb 7.3 oz (3835 g) APGAR: 8, 9  Baby Feeding: Breast Disposition:home with mother   04/27/2016 Wyvonnia Dusky, CNM

## 2016-04-28 DIAGNOSIS — Z98891 History of uterine scar from previous surgery: Secondary | ICD-10-CM

## 2016-04-28 LAB — CBC
HCT: 27.6 % — ABNORMAL LOW (ref 36.0–46.0)
Hemoglobin: 9.5 g/dL — ABNORMAL LOW (ref 12.0–15.0)
MCH: 30.6 pg (ref 26.0–34.0)
MCHC: 34.4 g/dL (ref 30.0–36.0)
MCV: 89 fL (ref 78.0–100.0)
Platelets: 208 10*3/uL (ref 150–400)
RBC: 3.1 MIL/uL — ABNORMAL LOW (ref 3.87–5.11)
RDW: 13.7 % (ref 11.5–15.5)
WBC: 7.2 10*3/uL (ref 4.0–10.5)

## 2016-04-28 LAB — COMPREHENSIVE METABOLIC PANEL
ALT: 40 U/L (ref 14–54)
AST: 41 U/L (ref 15–41)
Albumin: 1.9 g/dL — ABNORMAL LOW (ref 3.5–5.0)
Alkaline Phosphatase: 96 U/L (ref 38–126)
Anion gap: 8 (ref 5–15)
BUN: 10 mg/dL (ref 6–20)
CO2: 23 mmol/L (ref 22–32)
Calcium: 8.1 mg/dL — ABNORMAL LOW (ref 8.9–10.3)
Chloride: 109 mmol/L (ref 101–111)
Creatinine, Ser: 0.69 mg/dL (ref 0.44–1.00)
GFR calc Af Amer: >60 mL/min (ref >60)
GFR calc non Af Amer: >60 mL/min (ref >60)
Glucose, Bld: 103 mg/dL — ABNORMAL HIGH (ref 65–99)
Potassium: 3.5 mmol/L (ref 3.5–5.1)
Sodium: 140 mmol/L (ref 135–145)
Total Bilirubin: 0.4 mg/dL (ref 0.3–1.2)
Total Protein: 5.2 g/dL — ABNORMAL LOW (ref 6.5–8.1)

## 2016-04-28 MED ORDER — METHYLDOPA 250 MG PO TABS: 250.0000 mg | ORAL_TABLET | Freq: Two times a day (BID) | ORAL | 0 refills | Status: DC

## 2016-04-28 MED FILL — oxyCODONE HCL 5 MG TABS: 5 | 5 days supply | Qty: 30 | Fill #0

## 2016-04-28 NOTE — Progress Notes (Signed)
Order for Mag noted and made Charge nurse aware of need to move patient off unit. Patient refuses to transfer to 3rd floor to receive the mag infusion. She states,"I feel much better now than I did yesterday." Patient has many questions regarding her diagnosis and would like to speak with her OB. Patient denies any epigastric pain, headache or visual changes at this time. Reflexes are 2+and no clonus noted. Blood pressure remains stable. Notified [facility] and they will come see patient.

## 2016-04-28 NOTE — Discharge Summary (Signed)
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 yo G1P1001 at 6282w0d was admitted to the hospital 04/21/2016 for induction of labor. Patient had a labor course significant for induction of labor resulting in failure to progress. The patient went for cesarean section due to Arrest of Descent, and delivered a Viable infant, Membrane Rupture Time/Date: 2:40 PM ,04/23/2016   Details of operation can be found in separate operative Note.    Patient had an uncomplicated postpartum course. It was noted a slight increase in her AST to 63, but repeat this AM before discharge was normal. Patient had mildly elevated BPs yesterday and had some vision complaints, when restarted on antiHTN, her vision complaints went away and BPs normalized. She is ambulating, tolerating a regular diet, passing flatus and had BM, and urinating well.  Patient is discharged home in  stable condition on 04/28/16.                                    Physical exam  Vitals:   04/27/16 1723 04/27/16 2100 04/28/16 0030 04/28/16 0530  BP: 138/81 134/66 128/68 136/81  Pulse: 81 67 78 82  Resp: 16 18 20 18   Temp: 98.8 F (37.1 C) 98.2 F (36.8 C) 98.4 F (36.9 C) 98.4 F (36.9 C)  TempSrc: Oral Oral Oral Oral  SpO2: 95% 98%  99%  Weight:      Height:       General: alert, cooperative and no distress Lochia: appropriate Uterine Fundus: firm Incision: Healing well with no significant drainage, No significant erythema, Dressing is clean, dry, and intact DVT Evaluation: No evidence of DVT seen on physical exam. Labs: Lab Results  Component Value Date   WBC 7.2 04/28/2016   HGB 9.5 (L) 04/28/2016   HCT 27.6 (L) 04/28/2016   MCV 89.0 04/28/2016   PLT 208 04/28/2016   CMP Latest Ref Rng & Units 04/28/2016  Glucose 65 - 99 mg/dL 914(N103(H)  BUN 6 - 20  mg/dL 10  Creatinine 7.56 - 4.33 mg/dL 2.95  Sodium 188 - 416 mmol/L 140  Potassium 3.5 - 5.1 mmol/L 3.5  Chloride 101 - 111 mmol/L 109  CO2 22 - 32 mmol/L 23  Calcium 8.9 - 10.3 mg/dL 8.1(L)  Total Protein 6.5 - 8.1 g/dL 5.2(L)  Total Bilirubin 0.3 - 1.2 mg/dL 0.4  Alkaline Phos 38 - 126 U/L 96  AST 15 - 41 U/L 41  ALT 14 - 54 U/L 40    Discharge instruction: per After Visit Summary and "Baby and Me Booklet".  After visit meds:  Allergies as of 04/28/2016      Reactions   Amoxicillin Rash   Has patient had a PCN reaction causing immediate rash, facial/tongue/throat swelling, SOB or lightheadedness with hypotension: no Has patient had a PCN reaction causing severe rash involving mucus membranes or skin necrosis: yes Has patient had a PCN reaction that required hospitalization no Has patient had a PCN reaction occurring within the last 10 years: no If all of the above answers are "NO", then may proceed with Cephalosporin use.      Medication List    TAKE these medications   BAYER CONTOUR NEXT TEST test  strip Generic drug:  glucose blood   ibuprofen 600 MG tablet Commonly known as:  ADVIL,MOTRIN Take 1 tablet (600 mg total) by mouth every 6 (six) hours.   insulin lispro 100 UNIT/ML KiwkPen Commonly known as:  HUMALOG as needed   insulin pump Soln Inject 1 each into the skin continuous. Humalog insulin in pump, up to 150 units per day   methyldopa 250 MG tablet Commonly known as:  ALDOMET Take 1 tablet (250 mg total) by mouth 2 (two) times daily.   mometasone 50 MCG/ACT nasal spray Commonly known as:  NASONEX Place 2 sprays into the nose daily.   oxyCODONE 5 MG immediate release tablet Commonly known as:  Oxy IR/ROXICODONE Take 1 tablet (5 mg total) by mouth every 4 (four) hours as needed (pain scale 4-7).   prenatal multivitamin Tabs tablet Take 1 tablet by mouth daily at 12 noon. What changed:  medication strength   senna-docusate 8.6-50 MG tablet Commonly known as:  Senokot-S Take 2 tablets by mouth daily.       Diet: carb modified diet  Activity: Advance as tolerated. Pelvic rest for 6 weeks.   Outpatient follow up:1 week for BP check; 6 week postpartum check Follow up Appt:Future Appointments Date Time Provider Department Center  05/19/2016 1:30 PM Yetta Glassman, RN [facility] None   Follow up Visit:No Follow-up on file.  Postpartum contraception: IUD Mirena  Newborn Data: Live born female  Birth Weight: 8 lb 7.3 oz (3835 g) APGAR: 8, 9  Baby Feeding: Breast Disposition:home with mother   04/28/2016 Jen Mow, DO  OB Fellow

## 2016-04-28 NOTE — Lactation Note (Signed)
This note was copied from a baby's chart. Lactation Consultation Note  Patient Name: Valerie Dennis JXBJY'NToday's Date: 04/28/2016 Reason for consult: Follow-up assessment;Difficult latch;Infant weight loss Mom called for LC to assist with feeding before d/c home. Assisted Mom with latching baby to left breast using 20 nipple shield, no supplement. Baby developed a good suckling rhythm and stayed engaged at breast for 12 minutes, small amount of transitional milk in nipple shield. Changed to right breast, parents report baby has more difficulty on right breast. It took few attempts using 20 nipple shield for baby to latch, pre-filled nipple shield with EBM so baby could develop a good suckling rhythm. Add supplement at breast using 5 fr feeding tube/syringe, baby took 15 ml of EBM followed by 25 ml of supplement to finish the supplement. Baby stayed on right breast without supplement and [facility] Mom very pleased baby staying on breast without supplement and is observing some breast milk in nipple shield. Parents plan to keep BF plan discussed in previous note. Will continue to supplement with feedings till has OP f/u on Thursday. LC does note short posterior lingual frenulum. Baby did develop good suckling pattern on finger prior to latch, some restriction on tongue extension and lateral movement noted.  Parents to call for questions/concerns.   Maternal Data    Feeding Feeding Type: Breast Milk Length of feed: 12 min  LATCH Score/Interventions Latch: Repeated attempts needed to sustain latch, nipple held in mouth throughout feeding, stimulation needed to elicit sucking reflex. (using 20 nipple shield to latch) Intervention(s): Adjust position;Assist with latch;Breast massage  Audible Swallowing: Spontaneous and intermittent (with supplement at breast)  Type of Nipple: Everted at rest and after stimulation  Comfort (Breast/Nipple): Soft / non-tender     Hold  (Positioning): Assistance needed to correctly position infant at breast and maintain latch. Intervention(s): Breastfeeding basics reviewed;Support Pillows;Position options;Skin to skin  LATCH Score: 8  Lactation Tools Discussed/Used Tools: Nipple Shields;Pump;13F feeding tube / Syringe Nipple shield size: 20 Breast pump type: Double-Electric Breast Pump   Consult Status Consult Status: Complete Date: 04/28/16 Follow-up type: In-patient    Valerie Dennis, Valerie Dennis 04/28/2016, 1:30 PM

## 2016-04-28 NOTE — Lactation Note (Signed)
This note was copied from a baby's chart. Lactation Consultation Note f/u for feedings and latch score. Also there had been a note that stool was red clay. Consulted w/RN, RN stated it was in error and was corrected. Mom is supplementing as MD ordered. Resting,no need to see pt. During the night. LC will f/u on days. Patient Name: Valerie Dennis: 04/28/2016 Reason for consult: Follow-up assessment   Maternal Data    Feeding Feeding Type: Breast Fed Length of feed: 30 min  LATCH Score/Interventions Latch: Repeated attempts needed to sustain latch, nipple held in mouth throughout feeding, stimulation needed to elicit sucking reflex. Intervention(s): Breast compression;Adjust position  Audible Swallowing: Spontaneous and intermittent Intervention(s): Skin to skin;Hand expression Intervention(s): Alternate breast massage;Hand expression;Skin to skin  Type of Nipple: Flat Intervention(s): Double electric pump;Hand pump  Comfort (Breast/Nipple): Filling, red/small blisters or bruises, mild/mod discomfort  Problem noted: Mild/Moderate discomfort Interventions (Mild/moderate discomfort): Hand expression;Breast shields (coconut oil)  Hold (Positioning): No assistance needed to correctly position infant at breast. Intervention(s): Skin to skin;Breastfeeding basics reviewed;Support Pillows  LATCH Score: 7  Lactation Tools Discussed/Used Tools: Supplemental Nutrition System   Consult Status Consult Status: Follow-up Dennis: 04/28/16 Follow-up type: In-patient    Maureen Duesing, Diamond NickelLAURA G 04/28/2016, 2:12 AM

## 2016-04-28 NOTE — Discharge Instructions (Signed)
Preeclampsia and Eclampsia  Preeclampsia is a serious condition that develops only during pregnancy. It is also called toxemia of pregnancy. This condition causes high blood pressure along with other symptoms, such as swelling and headaches. These symptoms may develop as the condition gets worse. Preeclampsia may occur at 20 weeks of pregnancy or later.  Diagnosing and treating preeclampsia early is very important. If not treated early, it can cause serious problems for you and your baby. One problem it can lead to is eclampsia, which is a condition that causes muscle jerking or shaking (convulsions or seizures) in the mother. Delivering your baby is the best treatment for preeclampsia or eclampsia. Preeclampsia and eclampsia symptoms usually go away after your baby is born.  What are the causes?  The cause of preeclampsia is not known.  What increases the risk?  The following risk factors make you more likely to develop preeclampsia:  · Being pregnant for the first time.  · Having had preeclampsia during a past pregnancy.  · Having a family history of preeclampsia.  · Having high blood pressure.  · Being pregnant with twins or triplets.  · Being 35 or older.  · Being African-American.  · Having kidney disease or diabetes.  · Having medical conditions such as lupus or blood diseases.  · Being very overweight (obese).    What are the signs or symptoms?  The earliest signs of preeclampsia are:  · High blood pressure.  · Increased protein in your urine. Your health care provider will check for this at every visit before you give birth (prenatal visit).    Other symptoms that may develop as the condition gets worse include:  · Severe headaches.  · Sudden weight gain.  · Swelling of the hands, face, legs, and feet.  · Nausea and vomiting.  · Vision problems, such as blurred or double vision.  · Numbness in the face, arms, legs, and feet.  · Urinating less than usual.  · Dizziness.  · Slurred speech.  · Abdominal pain,  especially upper abdominal pain.  · Convulsions or seizures.    Symptoms generally go away after giving birth.  How is this diagnosed?  There are no screening tests for preeclampsia. Your health care provider will ask you about symptoms and check for signs of preeclampsia during your prenatal visits. You may also have tests that include:  · Urine tests.  · Blood tests.  · Checking your blood pressure.  · Monitoring your baby’s heart rate.  · Ultrasound.    How is this treated?  You and your health care provider will determine the treatment approach that is best for you. Treatment may include:  · Having more frequent prenatal exams to check for signs of preeclampsia, if you have an increased risk for preeclampsia.  · Bed rest.  · Reducing how much salt (sodium) you eat.  · Medicine to lower your blood pressure.  · Staying in the hospital, if your condition is severe. There, treatment will focus on controlling your blood pressure and the amount of fluids in your body (fluid retention).  · You may need to take medicine (magnesium sulfate) to prevent seizures. This medicine may be given as an injection or through an IV tube.  · Delivering your baby early, if your condition gets worse. You may have your labor started with medicine (induced), or you may have a cesarean delivery.    Follow these instructions at home:  Eating and drinking     ·   you need help quitting, ask your health care provider.  Do not use alcohol or drugs.  Avoid stress as much as possible. Rest and get plenty of sleep. General instructions   Take over-the-counter and prescription medicines only as told by your  health care provider.  When lying down, lie on your side. This keeps pressure off of your baby.  When sitting or lying down, raise (elevate) your feet. Try putting some pillows underneath your lower legs.  Exercise regularly. Ask your health care provider what kinds of exercise are best for you.  Keep all follow-up and prenatal visits as told by your health care provider. This is important. How is this prevented? To prevent preeclampsia or eclampsia from developing during another pregnancy:  Get proper medical care during pregnancy. Your health care provider may be able to prevent preeclampsia or diagnose and treat it early.  Your health care provider may have you take a low-dose aspirin or a calcium supplement during your next pregnancy.  You may have tests of your blood pressure and kidney function after giving birth.  Maintain a healthy weight. Ask your health care provider for help managing weight gain during pregnancy.  Work with your health care provider to manage any long-term (chronic) health conditions you have, such as diabetes or kidney problems. Contact a health care provider if:  You gain more weight than expected.  You have headaches.  You have nausea or vomiting.  You have abdominal pain.  You feel dizzy or light-headed. Get help right away if:  You develop sudden or severe swelling anywhere in your body. This usually happens in the legs.  You gain 5 lbs (2.3 kg) or more during one week.  You have severe:  Abdominal pain.  Headaches.  Dizziness.  Vision problems.  Confusion.  Nausea or vomiting.  You have a seizure.  You have trouble moving any part of your body.  You develop numbness in any part of your body.  You have trouble speaking.  You have any abnormal bleeding.  You pass out. This information is not intended to replace advice given to you by your health care provider. Make sure you discuss any questions you have with your health  care provider. Document Released: 01/24/2000 Document Revised: 09/24/2015 Document Reviewed: 09/02/2015 Elsevier Interactive Patient Education  2017 [employer]   Cesarean Delivery, Care After Refer to this sheet in the next few weeks. These instructions provide you with information about caring for yourself after your procedure. Your health care provider may also give you more specific instructions. Your treatment has been planned according to current medical practices, but problems sometimes occur. Call your health care provider if you have any problems or questions after your procedure. What can I expect after the procedure? After the procedure, it is common to have:  A small amount of blood or clear fluid coming from the incision.  Some redness, swelling, and pain in your incision area.  Some abdominal pain and soreness.  Vaginal bleeding (lochia).  Pelvic cramps.  Fatigue. Follow these instructions at home: Incision care    Follow instructions from your health care provider about how to take care of your incision. Make sure you:  Wash your hands with soap and water before you change your bandage (dressing). If soap and water are not available, use hand sanitizer.  Change your dressing as told by your health care provider.  Leave stitches (sutures), skin staples, skin glue, or adhesive strips in place. These skin closures may  need to stay in place for 2 weeks or longer. If adhesive strip edges start to loosen and curl up, you may trim the loose edges. Do not remove adhesive strips completely unless your health care provider tells you to do that.  Check your incision area every day for signs of infection. Check for:  More redness, swelling, or pain.  More fluid or blood.  Warmth.  Pus or a bad smell.  When you cough or sneeze, hug a pillow. This helps with pain and decreases the chance of your incision opening up (dehiscing). Do this until your incision  heals. Medicines   Take over-the-counter and prescription medicines only as told by your health care provider.  If you were prescribed an antibiotic medicine, take it as told by your health care provider. Do not stop taking the antibiotic until it is finished. Driving   Do not drive or operate heavy machinery while taking prescription pain medicine.  Do not drive for 24 hours if you received a sedative. Lifestyle   Do not drink alcohol. This is especially important if you are breastfeeding or taking pain medicine.  Do not use tobacco products, including cigarettes, chewing tobacco, or e-cigarettes. If you need help quitting, ask your health care provider. Tobacco can delay wound healing. Eating and drinking   Drink at least 8 eight-ounce glasses of water every day unless told not to by your health care provider. If you breastfeed, you may need to drink more water than this.  Eat high-fiber foods every day. These foods may help prevent or relieve constipation. High-fiber foods include:  Whole grain cereals and breads.  Brown rice.  Beans.  Fresh fruits and vegetables. Activity   Return to your normal activities as told by your health care provider. Ask your health care provider what activities are safe for you.  Rest as much as possible. Try to rest or take a nap while your baby is sleeping.  Do not lift anything that is heavier than your baby or 10 lb (4.5 kg) as told by your health care provider.  Ask your health care provider when you can engage in sexual activity. This may depend on your:  Risk of infection.  Healing rate.  Comfort and desire to engage in sexual activity. Bathing   Do not take baths, swim, or use a hot tub until your health care provider approves. Ask your health care provider if you can take showers. You may only be allowed to take sponge baths until your incision heals.  Keep your dressing dry as told by your health care provider. General  instructions   Do not use tampons or douches until your health care provider approves.  Wear:  Loose, comfortable clothing.  A supportive and well-fitting bra.  Watch for any blood clots that may pass from your vagina. These may look like clumps of dark red, brown, or black discharge.  Keep your perineum clean and dry as told by your health care provider.  Wipe from front to back when you use the toilet.  If possible, have someone help you care for your baby and help with household activities for a few days after you leave the hospital.  Keep all follow-up visits for you and your baby as told by your health care provider. This is important. Contact a health care provider if:  You have:  Bad-smelling vaginal discharge.  Difficulty urinating.  Pain when urinating.  A sudden increase or decrease in the frequency of your bowel movements.  More redness, swelling, or pain around your incision.  More fluid or blood coming from your incision.  Pus or a bad smell coming from your incision.  A fever.  A rash.  Little or no interest in activities you used to enjoy.  Questions about caring for yourself or your baby.  Nausea.  Your incision feels warm to the touch.  Your breasts turn red or become painful or hard.  You feel unusually sad or worried.  You vomit.  You pass large blood clots from your vagina. If you pass a blood clot, save it to show to your health care provider. Do not flush blood clots down the toilet without showing your health care provider.  You urinate more than usual.  You are dizzy or light-headed.  You have not breastfed and have not had a menstrual period for 12 weeks after delivery.  You stopped breastfeeding and have not had a menstrual period for 12 weeks after stopping breastfeeding. Get help right away if:  You have:  Pain that does not go away or get better with medicine.  Chest pain.  Difficulty breathing.  Blurred vision or  spots in your vision.  Thoughts about hurting yourself or your baby.  New pain in your abdomen or in one of your legs.  A severe headache.  You faint.  You bleed from your vagina so much that you fill two sanitary pads in one hour. This information is not intended to replace advice given to you by your health care provider. Make sure you discuss any questions you have with your health care provider. Document Released: 10/18/2001 Document Revised: 06/06/2015 Document Reviewed: 12/31/2014 Elsevier Interactive Patient Education  2017 [employer].

## 2016-04-28 NOTE — Lactation Note (Addendum)
This note was copied from a baby's chart. Lactation Consultation Note  Patient Name: Valerie Dennis ZOXWR'UToday's Date: 04/28/2016 Reason for consult: Follow-up assessment;Difficult latch Baby asleep and had recently fed so did not see feeding with this visit. Parents are supplementing each feeding using 5 fr feeding tube/syringe as SNS at breast along with 20 nipple shield, baby taking 25-35 ml with each feeding per Peds recommendations. Baby's weight loss has stabilized. Parents are happy with feeding tube system at breast. LC did discuss considering using bottle for feedings at night so parents can get more rest. Advised DR. Brown #1 bottle and discussed suck training./paced feeding with bottle. Parents report they will consider. Mom reports she is starting to pump more frequently. Plan for d/c home: Advised to continue to BF using SNS at breast to supplement during the day. Parents are currently BF 1 breast each feeding. Discussed considering to latch baby to 1st breast using nipple shield without supplement, try to keep baby nursing for 15 minutes,  (this will help to see if baby is able to transfer milk at breast, look for breast milk in nipple shield) then give supplement on 2nd breast while baby is nursing. Alternate this pattern each feeding. If this is too much work or baby will not take both breasts a feeding then continue to supplement with baby nursing 1 breast per feeding. Consider pump/bottle at night for parents to get more rest and for Mom to have time to pump at night since baby not very efficient with nursing at this time. Mom to pump every 3 hours for 15 -20- minutes. At least 8 times per 24 hours. Continue to supplement with amounts 30-45 ml. OP f/u scheduled for Thursday, 04/30/16 at 1:00 pm.  Mom to call for questions/concerns.    Maternal Data    Feeding Feeding Type: Breast Fed  LATCH Score/Interventions                      Lactation Tools  Discussed/Used Tools: Pump;35F feeding tube / Syringe;Nipple Shields Nipple shield size: 20 Breast pump type: Double-Electric Breast Pump   Consult Status Consult Status: Complete Date: 04/28/16 Follow-up type: In-patient    Valerie Dennis, Valerie Dennis 04/28/2016, 11:52 AM

## 2016-04-28 NOTE — Progress Notes (Signed)
Patient has a CGM and an insulin pump and refused to give me the basal rate and her boluses. She stated,"I signed a waver about my insulin pump so the nurses would not have to worry with it and chart anything about it." 2200 glucose was 91 via CGM and 0545 glucose via CGM was 85,

## 2016-04-29 ENCOUNTER — Other Ambulatory Visit: Payer: Self-pay

## 2016-04-29 NOTE — Patient Outreach (Signed)
[facility] Care Management  04/29/2016  Valerie ButtStephanie Dennis 08/21/1985 [id number]   Telephone call for transition of care call.  Member was hospitalized on 04/21/16 and had c-section delivery on 04/24/16 and was discharged on 04/28/16.   No answer and message left. Plan to call tomorrow if call not returned. Dudley MajorMelissa Sandlin RN, [facility] LPBSN,CCM Care Management Coordinator-[facility] [phone]

## 2016-04-30 ENCOUNTER — Inpatient Hospital Stay (HOSPITAL_COMMUNITY)
Admission: AD | Admit: 2016-04-30 | Discharge: 2016-05-02 | DRG: 776 | Disposition: A | Discharge status: Home / Self Care | Payer: 59 | Source: Ambulatory Visit | Attending: Obstetrics & Gynecology | Admitting: Obstetrics & Gynecology

## 2016-04-30 ENCOUNTER — Encounter (HOSPITAL_COMMUNITY): Payer: Self-pay | Admitting: *Deleted

## 2016-04-30 DIAGNOSIS — O1003 Pre-existing essential hypertension complicating the puerperium: Secondary | ICD-10-CM | POA: Diagnosis present

## 2016-04-30 DIAGNOSIS — R0602 Shortness of breath: Secondary | ICD-10-CM | POA: Diagnosis not present

## 2016-04-30 DIAGNOSIS — O1415 Severe pre-eclampsia, complicating the puerperium: Secondary | ICD-10-CM | POA: Diagnosis present

## 2016-04-30 DIAGNOSIS — O115 Pre-existing hypertension with pre-eclampsia, complicating the puerperium: Secondary | ICD-10-CM | POA: Diagnosis present

## 2016-04-30 LAB — PROTEIN / CREATININE RATIO, URINE
Creatinine, Urine: 25 mg/dL
Total Protein, Urine: <6 mg/dL

## 2016-04-30 LAB — COMPREHENSIVE METABOLIC PANEL
ALT: 30 U/L (ref 14–54)
AST: 23 U/L (ref 15–41)
Albumin: 2.4 g/dL — ABNORMAL LOW (ref 3.5–5.0)
Alkaline Phosphatase: 106 U/L (ref 38–126)
Anion gap: 8 (ref 5–15)
BUN: 12 mg/dL (ref 6–20)
CO2: 21 mmol/L — ABNORMAL LOW (ref 22–32)
Calcium: 8.3 mg/dL — ABNORMAL LOW (ref 8.9–10.3)
Chloride: 108 mmol/L (ref 101–111)
Creatinine, Ser: 0.7 mg/dL (ref 0.44–1.00)
GFR calc Af Amer: >60 mL/min (ref >60)
GFR calc non Af Amer: >60 mL/min (ref >60)
Glucose, Bld: 101 mg/dL — ABNORMAL HIGH (ref 65–99)
Potassium: 4.3 mmol/L (ref 3.5–5.1)
Sodium: 137 mmol/L (ref 135–145)
Total Bilirubin: 0.5 mg/dL (ref 0.3–1.2)
Total Protein: 6 g/dL — ABNORMAL LOW (ref 6.5–8.1)

## 2016-04-30 LAB — CBC
HCT: 32.6 % — ABNORMAL LOW (ref 36.0–46.0)
Hemoglobin: 10.9 g/dL — ABNORMAL LOW (ref 12.0–15.0)
MCH: 29.9 pg (ref 26.0–34.0)
MCHC: 33.4 g/dL (ref 30.0–36.0)
MCV: 89.3 fL (ref 78.0–100.0)
Platelets: 321 10*3/uL (ref 150–400)
RBC: 3.65 MIL/uL — ABNORMAL LOW (ref 3.87–5.11)
RDW: 13.3 % (ref 11.5–15.5)
WBC: 8.8 10*3/uL (ref 4.0–10.5)

## 2016-04-30 MED ORDER — AMLODIPINE BESYLATE 10 MG PO TABS
10.0000 mg | ORAL_TABLET | Freq: Every day | ORAL | Status: DC
  Administered 2016-04-30: 10 mg via ORAL
  Filled 2016-04-30 (×2): qty 1

## 2016-04-30 MED ORDER — MAGNESIUM SULFATE BOLUS VIA INFUSION
4.0000 g | Freq: Once | INTRAVENOUS | Status: AC
  Administered 2016-04-30: 4 g via INTRAVENOUS
  Filled 2016-04-30: qty 500

## 2016-04-30 MED ORDER — OXYCODONE HCL 5 MG PO TABS
5.0000 mg | ORAL_TABLET | Freq: Four times a day (QID) | ORAL | Status: DC | PRN
  Administered 2016-04-30: 5 mg via ORAL
  Filled 2016-04-30: qty 1

## 2016-04-30 MED ORDER — ACETAMINOPHEN 325 MG PO TABS
650.0000 mg | ORAL_TABLET | Freq: Four times a day (QID) | ORAL | Status: DC | PRN
  Administered 2016-04-30: 650 mg via ORAL
  Filled 2016-04-30: qty 2

## 2016-04-30 MED ORDER — SENNOSIDES-DOCUSATE SODIUM 8.6-50 MG PO TABS
1.0000 | ORAL_TABLET | Freq: Two times a day (BID) | ORAL | Status: DC | PRN
  Administered 2016-05-01: 2 via ORAL
  Filled 2016-04-30: qty 2

## 2016-04-30 MED ORDER — LABETALOL HCL 5 MG/ML IV SOLN: 20.0000 mg | INTRAVENOUS | Status: DC | PRN

## 2016-04-30 MED ORDER — INSULIN PUMP
1.0000 | SUBCUTANEOUS | Status: DC
  Administered 2016-04-30: 1 via SUBCUTANEOUS
  Filled 2016-04-30: qty 1

## 2016-04-30 MED ORDER — OXYCODONE HCL 5 MG PO TABS
5.0000 mg | ORAL_TABLET | Freq: Once | ORAL | Status: AC
  Administered 2016-04-30: 5 mg via ORAL
  Filled 2016-04-30: qty 1

## 2016-04-30 MED ORDER — LACTATED RINGERS IV SOLN
INTRAVENOUS | Status: DC
Start: 2016-04-30 — End: 2016-05-02
  Administered 2016-04-30: 12:00:00 via INTRAVENOUS

## 2016-04-30 MED ORDER — HYDRALAZINE HCL 20 MG/ML IJ SOLN: 10.0000 mg | Freq: Once | INTRAMUSCULAR | Status: DC | PRN

## 2016-04-30 MED ORDER — MAGNESIUM SULFATE 40 G IN LACTATED RINGERS - SIMPLE
2.0000 g/h | INTRAVENOUS | Status: AC
  Administered 2016-05-01: 2 g/h via INTRAVENOUS
  Filled 2016-04-30 (×2): qty 500

## 2016-04-30 MED ORDER — OXYCODONE HCL 5 MG PO TABS
5.0000 mg | ORAL_TABLET | ORAL | Status: DC | PRN
  Administered 2016-05-01: 5 mg via ORAL
  Filled 2016-04-30: qty 1

## 2016-04-30 MED ORDER — IBUPROFEN 600 MG PO TABS
600.0000 mg | ORAL_TABLET | Freq: Four times a day (QID) | ORAL | Status: DC
  Administered 2016-04-30 – 2016-05-02 (×8): 600 mg via ORAL
  Filled 2016-04-30 (×9): qty 1

## 2016-04-30 MED ORDER — HYDRALAZINE HCL 20 MG/ML IJ SOLN
5.0000 mg | INTRAMUSCULAR | Status: AC | PRN
  Administered 2016-04-30: 5 mg via INTRAVENOUS
  Administered 2016-04-30: 10 mg via INTRAVENOUS
  Filled 2016-04-30: qty 1

## 2016-04-30 MED ORDER — LABETALOL HCL 5 MG/ML IV SOLN
20.0000 mg | INTRAVENOUS | Status: AC | PRN
  Administered 2016-04-30: 40 mg via INTRAVENOUS
  Administered 2016-04-30: 20 mg via INTRAVENOUS
  Filled 2016-04-30: qty 4
  Filled 2016-04-30: qty 8

## 2016-04-30 NOTE — Lactation Note (Signed)
Lactation Consultation Note  Patient Name: Valerie Dennis ZOXWR'UToday's Date: 04/30/2016  Mom called for lactation because the last 2 times she has used the DEBP she did not get any milk from the L side. She normally feeds baby then post pumps and always until toady get milk from the L. She is using the same flanges and suction as she does at home. Instructed to massage the breast and apply a heat pack for 10 minutes then pump. She will contact lactation if pumping does not improve.    Maternal Data    Feeding    LATCH Score/Interventions                      Lactation Tools Discussed/Used     Consult Status      [name] 04/30/2016, 9:58 PM

## 2016-04-30 NOTE — MAU Provider Note (Signed)
History     CSN: [id number]  Arrival date and time: 04/30/16 1052  G1P1 postpartum CS 6 days ago here with worsening SOB and dizziness since last night. She says the SOB worsens with lying down and she and spouse could hear crackles this morning. She also reports intermittent HA that responds to analgesics. She denies visual disturbances. She has hx of CHTN with superimposed preeclampsia and admits to missing a few doses of Aldomet the first day she was home but has taken it correctly yesterday and today. Bleeding is minimal. She is breastfeeding.     Past Medical History:  Diagnosis Date  . Diabetes mellitus without complication (HCC)   . Hypertension     Past Surgical History:  Procedure Laterality Date  . CAUTERY OF TURBINATES  2003  . CESAREAN SECTION N/A 04/24/2016   Procedure: CESAREAN SECTION;  Surgeon: Reva Bores, MD;  Location: [facility] BIRTHING SUITES;  Service: Obstetrics;  Laterality: N/A;  . DE QUERVAIN'S RELEASE  2016  . WISDOM TOOTH EXTRACTION      Family History  Problem Relation Age of Onset  . Hyperlipidemia Mother   . Rheum arthritis Mother   . Heart disease Father   . Hyperlipidemia Father   . Hypertension Father   . Stroke Father   . Hypertension Sister   . Heart disease Maternal Grandmother   . Heart disease Maternal Grandfather   . Heart disease Paternal Grandfather   . Hypertension Paternal Grandfather   . Diabetes Paternal Grandfather     Social History  Substance Use Topics  . Smoking status: Never Smoker  . Smokeless tobacco: Never Used  . Alcohol use 0.0 oz/week     Comment: Socially before pregnant    Allergies:  Allergies  Allergen Reactions  . Amoxicillin Rash    Has patient had a PCN reaction causing immediate rash, facial/tongue/throat swelling, SOB or lightheadedness with hypotension: no Has patient had a PCN reaction causing severe rash involving mucus membranes or skin necrosis: yes Has patient had a PCN reaction that required  hospitalization no Has patient had a PCN reaction occurring within the last 10 years: no If all of the above answers are "NO", then may proceed with Cephalosporin use.     Prescriptions Prior to Admission  Medication Sig Dispense Refill Last Dose  . BAYER CONTOUR NEXT TEST test strip   11 Taking  . ibuprofen (ADVIL,MOTRIN) 600 MG tablet Take 1 tablet (600 mg total) by mouth every 6 (six) hours. 30 tablet 0   . Insulin Human (INSULIN PUMP) SOLN Inject 1 each into the skin continuous. Humalog insulin in pump, up to 150 units per day   04/22/2016 at Unknown time  . insulin lispro (HUMALOG) 100 UNIT/ML KiwkPen as needed   prn  . methyldopa (ALDOMET) 250 MG tablet Take 1 tablet (250 mg total) by mouth 2 (two) times daily. 60 tablet 0   . mometasone (NASONEX) 50 MCG/ACT nasal spray Place 2 sprays into the nose daily.   Past Week at Unknown time  . oxyCODONE (OXY IR/ROXICODONE) 5 MG immediate release tablet Take 1 tablet (5 mg total) by mouth every 4 (four) hours as needed (pain scale 4-7). 30 tablet 0   . Prenatal Vit-Fe Fumarate-FA (PRENATAL MULTIVITAMIN) TABS tablet Take 1 tablet by mouth daily at 12 noon. 30 tablet 11   . senna-docusate (SENOKOT-S) 8.6-50 MG tablet Take 2 tablets by mouth daily. 30 tablet 1     Review of Systems  Eyes: Negative for visual  disturbance.  Respiratory: Positive for shortness of breath.   Genitourinary: Positive for vaginal bleeding.  Neurological: Positive for dizziness and headaches.   Physical Exam   Blood pressure (!) 173/81, pulse 78, temperature 98.1 F (36.7 C), temperature source Oral, resp. rate 20, SpO2 97 %, unknown if currently breastfeeding.  Patient Vitals for the past 24 hrs:  BP Temp Temp src Pulse Resp SpO2  04/30/16 1631 (!) 141/97 - - 91 - -  04/30/16 1601 (!) 151/84 - - 89 - -  04/30/16 1551 (!) 154/89 - - 86 - -  04/30/16 1521 (!) 153/92 - - 88 - -  04/30/16 1511 (!) 143/88 - - 82 - -  04/30/16 1501 (!) 151/84 - - 85 - -  04/30/16  1451 (!) 149/83 - - 87 - -  04/30/16 1442 (!) 162/84 - - 87 - -  04/30/16 1440 (!) 152/90 - - 90 - -  04/30/16 1421 (!) 142/83 - - 89 - -  04/30/16 1411 (!) 154/87 - - 90 - -  04/30/16 1401 (!) 162/90 - - 90 - -  04/30/16 1351 (!) 162/89 - - 91 - -  04/30/16 1341 (!) 155/89 - - 87 - -  04/30/16 1331 (!) 158/93 - - 88 - -  04/30/16 1325 (!) 148/86 - - 95 - -  04/30/16 1301 (!) 180/96 - - 91 - -  04/30/16 1251 (!) 171/89 - - 85 - -  04/30/16 1246 (!) 171/95 - - 78 - -  04/30/16 1242 (!) 166/98 - - 81 - -  04/30/16 1231 (!) 161/96 - - 89 - -  04/30/16 1222 (!) 172/98 - - 77 - -  04/30/16 1201 (!) 163/97 - - 85 - -  04/30/16 1146 (!) 165/94 - - 82 - -  04/30/16 1120 (!) 161/84 - - 81 - -  04/30/16 1112 (!) 173/81 98.1 F (36.7 C) Oral 78 20 97 %    Physical Exam  Nursing note and vitals reviewed. Constitutional: She is oriented to person, place, and time. She appears well-developed and well-nourished. No distress.  HENT:  Head: Normocephalic and atraumatic.  Neck: Normal range of motion.  Cardiovascular: Normal rate, regular rhythm and normal heart sounds.   Respiratory: Effort normal and breath sounds normal. No respiratory distress. She has no wheezes. She has no rales.  GI: Soft. She exhibits no distension. There is no tenderness.  Musculoskeletal: Normal range of motion. She exhibits edema (1+ BLE).  Neurological: She is alert and oriented to person, place, and time. She has normal reflexes.  Skin: Skin is warm and dry.  Psychiatric: She has a normal mood and affect.   Results for orders placed or performed during the hospital encounter of 04/30/16 (from the past 24 hour(s))  Protein / creatinine ratio, urine     Status: None   Collection Time: 04/30/16 11:35 AM  Result Value Ref Range   Creatinine, Urine 25.00 mg/dL   Total Protein, Urine <6 mg/dL   Protein Creatinine Ratio        0.00 - 0.15 mg/mg[Cre]  CBC     Status: Abnormal   Collection Time: 04/30/16 12:09 PM   Result Value Ref Range   WBC 8.8 4.0 - 10.5 K/uL   RBC 3.65 (L) 3.87 - 5.11 MIL/uL   Hemoglobin 10.9 (L) 12.0 - 15.0 g/dL   HCT 96.0 (L) 45.4 - 09.8 %   MCV 89.3 78.0 - 100.0 fL   MCH 29.9 26.0 - 34.0  pg   MCHC 33.4 30.0 - 36.0 g/dL   RDW 16.113.3 09.611.5 - 04.515.5 %   Platelets 321 150 - 400 K/uL  Comprehensive metabolic panel     Status: Abnormal   Collection Time: 04/30/16 12:09 PM  Result Value Ref Range   Sodium 137 135 - 145 mmol/L   Potassium 4.3 3.5 - 5.1 mmol/L   Chloride 108 101 - 111 mmol/L   CO2 21 (L) 22 - 32 mmol/L   Glucose, Bld 101 (H) 65 - 99 mg/dL   BUN 12 6 - 20 mg/dL   Creatinine, Ser 4.090.70 0.44 - 1.00 mg/dL   Calcium 8.3 (L) 8.9 - 10.3 mg/dL   Total Protein 6.0 (L) 6.5 - 8.1 g/dL   Albumin 2.4 (L) 3.5 - 5.0 g/dL   AST 23 15 - 41 U/L   ALT 30 14 - 54 U/L   Alkaline Phosphatase 106 38 - 126 U/L   Total Bilirubin 0.5 0.3 - 1.2 mg/dL   GFR calc non Af Amer >60 >60 mL/min   GFR calc Af Amer >60 >60 mL/min   Anion gap 8 5 - 15   MAU Course  Procedures Hydralazine IV 5mg  then 10 mg Labetalol IV 20 mg then 40 mg Oxycodone 5 mg x2 doses  MDM Labs ordered and reviewed. Presentation, clinical findings, and plan discussed with Dr. Macon LargeAnyanwu.   Assessment and Plan  Postpartum preeclampsia Admit  Management per Dr. Colbert CoyerAnyanwu  Shina Wass, CNM 04/30/2016, 11:16 AM

## 2016-04-30 NOTE — MAU Note (Signed)
Pt has continuous glucose monitoring and an insulin pump. Reports current glucose is 94

## 2016-04-30 NOTE — Lactation Note (Signed)
Lactation Consultation Note: Mom had OP appointment for 1 pm.Seen in MAU due to high BP and shortness of breath. [name]'s weight today 3595 g  7 # 14.8 oz in clean diaper. Assisted mom with latch. She is using NS. Reports her milk came in 2 days ago. Latched [name] with #20 NS She nursed for 15 min but few swallows noted and little BM in NS. Weight after first breast 3600 g  7 # 15 oz. Latched to left breast and nursed for another 10 min, getting sleepy. Again few swallows noted but BM noted in NS.Weight after second breast 3605 g 7 # 15.2 oz  Very fussy when she came off the breast. Mom pumped for 15 min and obtained about 10 ml.. Dad finger fed EBM while mom is pumping with #5 Fr feeding tube/syringe. She took 21 ml and content,.Mom has headache and feels bad. If admitted to hospital, encouraged to page for assist while here. If she goes home. encouraged to make another OP appointment. No questions at present. Encouragement given.   Patient Name: Valerie Dennis ZOXWR'UToday's Date: 04/30/2016     Maternal Data    Feeding    LATCH Score/Interventions                      Lactation Tools Discussed/Used     Consult Status      Pamelia HoitWeeks, Kaemon Barnett D 04/30/2016, 1:30 PM

## 2016-04-30 NOTE — H&P (Signed)
Valerie ButtStephanie Dennis is a 31 y.o. female G1P1 postpartum CS 6 days ago here with worsening SOB, dizziness, and HA. She has hx of CHTN with superimposed preeclampsia. Evaluation in MAU revealed severe range BPs. IV antihypertensives were given and BPs improved but still remained borderline elevated. Pre-e labs were nml. She continued to have a HA despite administration of Oxycodone.  . OB History    Gravida Para Term Preterm AB Living   1 1 1     1    SAB TAB Ectopic Multiple Live Births         0 1     Past Medical History:  Diagnosis Date  . Diabetes mellitus without complication (HCC)   . Hypertension    Past Surgical History:  Procedure Laterality Date  . CAUTERY OF TURBINATES  2003  . CESAREAN SECTION N/A 04/24/2016   Procedure: CESAREAN SECTION;  Surgeon: Reva Boresanya S Pratt, MD;  [facility];  Service: Obstetrics;  Laterality: N/A;  . DE QUERVAIN'S RELEASE  2016  . WISDOM TOOTH EXTRACTION     Family History: family history includes Diabetes in her paternal grandfather; Heart disease in her father, maternal grandfather, maternal grandmother, and paternal grandfather; Hyperlipidemia in her father and mother; Hypertension in her father, paternal grandfather, and sister; Rheum arthritis in her mother; Stroke in her father. Social History:  reports that she has never smoked. She has never used smokeless tobacco. She reports that she drinks alcohol. She reports that she does not use drugs.   Review of Systems  Respiratory: Positive for shortness of breath.   Neurological: Positive for dizziness and headaches.   History   Blood pressure (!) 141/97, pulse 91, temperature 98.1 F (36.7 C), temperature source Oral, resp. rate 20, SpO2 97 %, unknown if currently breastfeeding. Exam Physical Exam  Nursing note and vitals reviewed. Constitutional: She is oriented to person, place, and time. She appears well-developed and well-nourished. No distress.  HENT:  Head: Normocephalic  and atraumatic.  Neck: Normal range of motion.  Cardiovascular: Normal rate, regular rhythm and normal heart sounds.   Respiratory: Effort normal and breath sounds normal. No respiratory distress.  GI: Soft. She exhibits no distension. There is no tenderness.  Musculoskeletal: Normal range of motion. She exhibits edema (1+ BLE).  Neurological: She is alert and oriented to person, place, and time. She has normal reflexes.  Skin: Skin is warm and dry.  Low transverse abdominal incision intact with honeycomb dsg  Psychiatric: She has a normal mood and affect.     Assessment/Plan: Chronic HTN w/superimposed PEC, postpartum Head[facility] Magnesium Sulfate for seizure prophylaxis Norvasc Strict I/O  Donette LarryMelanie Nylani Michetti, CNM 04/30/2016, 4:46 PM

## 2016-04-30 NOTE — Progress Notes (Signed)
Patient refusing to start magnesium until after the infant breastfeeds. Risks of delaying mag explained and patient encouraged to begin mag as soon as possible. RN will continue to assess the patient's readiness to start magnesium.

## 2016-04-30 NOTE — MAU Note (Signed)
Pt is postpartum C/S and was discharged on b/p meds and was discharged 2 days ago. Has been increasingly short of breath, a headache and some dizziness.

## 2016-05-01 MED ORDER — METHYLDOPA 500 MG PO TABS
500.0000 mg | ORAL_TABLET | Freq: Two times a day (BID) | ORAL | Status: DC
  Administered 2016-05-01 – 2016-05-02 (×3): 500 mg via ORAL
  Filled 2016-05-01 (×5): qty 1

## 2016-05-01 NOTE — Consult Note (Signed)
Lactation consult with this mom and 701 week old baby. The baby is not a patient, but mom was readmitted to high risk OB unit, for pre elampsia. Mom has been breastfeeding with nipple shield, and then dad is finger feeding EBM to baby. The baby is stable with weight, but not gaining .  Mom called me to assist with latch at 6 pm. The mom was using a 20 nipple shield, and we tried SNS  Under the shield, but the shield would not stay in place. Dad then finger fed 50 ml's to [name], and she was satiated after this. I fitted mom with 16 nipple shield, and they stayed on somewhat better, but mom has very round breasts with flat nipples, and it is difficult to get a good seal . Mom will try to breast feed with the 16 shield. A baby scale is in the room,and the parents will try a pre and post weight, to see  how much [name] is transferring at the breast.  Parents are aware that [name] has an upper lip tight frenulum, and a posterior short frenulum, which makes it difficult for her to latch. Parents agreed to gather information on getting   [name]'s frenulums  revised, which I feel will help her with breastfeeding. Mom also made an o/p lactation consult appointment for 3/27 at 1300.

## 2016-05-01 NOTE — Progress Notes (Signed)
Faculty Practice OB/GYN Attending Note   Subjective:  Patient is doing well, denies HA, visual changes, abdominal pain.  Breastfeeding. Still on  magnesium sulfate.  Wants to be on Aldomet for BP control; other medications make her feel weird.  Admitted on 04/30/2016 for Hypertension in pregnancy, preeclampsia, severe, postpartum condition.    Objective:  Blood pressure 133/83, pulse 89, temperature 97.9 F (36.6 C), temperature source Oral, resp. rate 18, height 5' 4.02" (1.626 m), weight 232 lb (105.2 kg), SpO2 98 %, unknown if currently breastfeeding. Gen: NAD HENT: Normocephalic, atraumatic Lungs: Normal respiratory effort Heart: Regular rate noted Abdomen: NT, soft, incision C/D/I Cervix: Deferred Ext: 2+ DTRs, no edema, no cyanosis, negative Homan's sign  Assessment & Plan:  31 y.o. G1P1001 POD#7 readmitted for postpartum preeclampsia. - BP stable. Will try Aldomet 500 mg bid and increase as needed - Continue magnesium sulfate for 24 hours - Analgesia as needed - Continue close observation for now.  Possible discharge late today or tomorrow morning if remains stable.   Jaynie CollinsUGONNA  Anastasya Jewell, MD, FACOG Attending Obstetrician & Gynecologist Faculty Practice, [facility]

## 2016-05-01 NOTE — Progress Notes (Signed)
cbg 123 at 0800 per pt  cbg 106 at 0930 per pt

## 2016-05-02 MED ORDER — METHYLDOPA 500 MG PO TABS
500.0000 mg | ORAL_TABLET | Freq: Two times a day (BID) | ORAL | 0 refills | Status: DC
Start: 2016-05-02 — End: 2016-05-19

## 2016-05-02 NOTE — Progress Notes (Signed)
Dr. Debroah LoopArnold at bedside talking with pt.

## 2016-05-02 NOTE — Progress Notes (Signed)
Patient discharged home with husband. Discharge teaching, paperwork, home-care, and follow-up appts reviewed. Signs/symptoms of preeclampsia and reasons to seek medical care reviewed. No questions at this time.

## 2016-05-02 NOTE — Discharge Summary (Signed)
Physician Discharge Summary  Patient ID: Valerie Dennis MRN: [id number] DOB/AGE: 1985-09-16 31 y.o.  Admit date: 04/30/2016 Discharge date: 05/02/2016  Admission Diagnoses:Postpartum preeclampsia  Discharge Diagnoses:  Principal Problem:   Hypertension in pregnancy, preeclampsia, severe, postpartum condition   Discharged Condition: good  Hospital Course: Valerie Dennis is a 31 y.o. female G1P1 postpartum CS 6 days ago here with worsening SOB, dizziness, and HA. She has hx of CHTN with superimposed preeclampsia. Evaluation in MAU revealed severe range BPs. IV antihypertensives were given and BPs improved but still remained borderline elevated. Pre-e labs were nml. She continued to have a HA despite administration of Oxycodone.  . OB History    Gravida Para Term Preterm AB Living   1 1 1     1    SAB TAB Ectopic Multiple Live Births         0 1         Past Medical History:  Diagnosis Date  . Diabetes mellitus without complication (HCC)   . Hypertension         Past Surgical History:  Procedure Laterality Date  . CAUTERY OF TURBINATES  2003  . CESAREAN SECTION N/A 04/24/2016   Procedure: CESAREAN SECTION;  Surgeon: Reva Boresanya S Pratt, MD;  [facility];  Service: Obstetrics;  Laterality: N/A;  . DE QUERVAIN'S RELEASE  2016  . WISDOM TOOTH EXTRACTION     Family History: family history includes Diabetes in her paternal grandfather; Heart disease in her father, maternal grandfather, maternal grandmother, and paternal grandfather; Hyperlipidemia in her father and mother; Hypertension in her father, paternal grandfather, and sister; Rheum arthritis in her mother; Stroke in her father. Social History:  reports that she has never smoked. She has never used smokeless tobacco. She reports that she drinks alcohol. She reports that she does not use drugs.   Review of Systems  Respiratory: Positive for shortness of breath.   Neurological: Positive for  dizziness and headaches.   History Blood pressure (!) 141/97, pulse 91, temperature 98.1 F (36.7 C), temperature source Oral, resp. rate 20, SpO2 97 %, unknown if currently breastfeeding. Exam Physical Exam  Nursing note and vitals reviewed. Constitutional: She is oriented to person, place, and time. She appears well-developed and well-nourished. No distress.  HENT:  Head: Normocephalic and atraumatic.  Neck: Normal range of motion.  Cardiovascular: Normal rate, regular rhythm and normal heart sounds.   Respiratory: Effort normal and breath sounds normal. No respiratory distress.  GI: Soft. She exhibits no distension. There is no tenderness.  Musculoskeletal: Normal range of motion. She exhibits edema (1+ BLE).  Neurological: She is alert and oriented to person, place, and time. She has normal reflexes.  Skin: Skin is warm and dry.  Low transverse abdominal incision intact with honeycomb dsg  Psychiatric: She has a normal mood and affect.     Assessment/Plan: Chronic HTN w/superimposed PEC, postpartum [facility]ache [facility] Magnesium Sulfate for seizure prophylaxis Norvasc Strict I/O  Her symptoms improved and she was normotensive the day of discharge with no complications  Consults: None  Significant Diagnostic Studies: labs:  CBC    Component Value Date/Time   WBC 8.8 04/30/2016 1209   RBC 3.65 (L) 04/30/2016 1209   HGB 10.9 (L) 04/30/2016 1209   HCT 32.6 (L) 04/30/2016 1209   HCT 39.1 02/18/2016 1644   PLT 321 04/30/2016 1209   PLT 228 02/18/2016 1644   MCV 89.3 04/30/2016 1209   MCV 92 02/18/2016 1644   MCH  29.9 04/30/2016 1209   MCHC 33.4 04/30/2016 1209   RDW 13.3 04/30/2016 1209   RDW 14.1 02/18/2016 1644   LYMPHSABS 2,675 09/23/2015 1642   MONOABS 642 09/23/2015 1642   EOSABS 107 09/23/2015 1642   BASOSABS 0 09/23/2015 1642   CMP Latest Ref Rng & Units 04/30/2016 04/28/2016 04/27/2016  Glucose 65 - 99 mg/dL 161(W) 960(A) 81  BUN 6 - 20 mg/dL  12 10 9   Creatinine 0.44 - 1.00 mg/dL 5.40 9.81 1.91  Sodium 135 - 145 mmol/L 137 140 140  Potassium 3.5 - 5.1 mmol/L 4.3 3.5 3.6  Chloride 101 - 111 mmol/L 108 109 108  CO2 22 - 32 mmol/L 21(L) 23 25  Calcium 8.9 - 10.3 mg/dL 8.3(L) 8.1(L) 8.2(L)  Total Protein 6.5 - 8.1 g/dL 6.0(L) 5.2(L) 5.3(L)  Total Bilirubin 0.3 - 1.2 mg/dL 0.5 0.4 0.4  Alkaline Phos 38 - 126 U/L 106 96 109  AST 15 - 41 U/L 23 41 65(H)  ALT 14 - 54 U/L 30 40 46      Treatments: IV hydration and magnesium sulfate, labetalol, aldomet  Discharge Exam: Blood pressure 102/62, pulse 91, temperature 98.2 F (36.8 C), temperature source Oral, resp. rate 18, height 5' 4.02" (1.626 m), weight 225 lb (102.1 kg), SpO2 98 %, unknown if currently breastfeeding. General appearance: alert, cooperative and no distress  Disposition: 01-Home or Self Care    No current facility-administered medications on file prior to encounter.    Current Outpatient Prescriptions on File Prior to Encounter  Medication Sig Dispense Refill  . ibuprofen (ADVIL,MOTRIN) 600 MG tablet Take 1 tablet (600 mg total) by mouth every 6 (six) hours. 30 tablet 0  . Insulin Human (INSULIN PUMP) SOLN Inject 1 each into the skin continuous. Humalog insulin in pump, up to 150 units per day    . methyldopa (ALDOMET) 250 MG tablet Take 1 tablet (250 mg total) by mouth 2 (two) times daily. 60 tablet 0  . oxyCODONE (OXY IR/ROXICODONE) 5 MG immediate release tablet Take 1 tablet (5 mg total) by mouth every 4 (four) hours as needed (pain scale 4-7). 30 tablet 0  . Prenatal Vit-Fe Fumarate-FA (PRENATAL MULTIVITAMIN) TABS tablet Take 1 tablet by mouth daily at 12 noon. 30 tablet 11  . senna-docusate (SENOKOT-S) 8.6-50 MG tablet Take 2 tablets by mouth daily. 30 tablet 1  . BAYER CONTOUR NEXT TEST test strip   11  . insulin lispro (HUMALOG) 100 UNIT/ML KiwkPen as needed       Follow-up Information    [facility] [place] Follow up on  05/04/2016.   Specialty:  Obstetrics and Gynecology Contact information: [address], [address] [place] [address] [phone]          Signed: Scheryl Darter 05/02/2016, 7:01 AM

## 2016-05-02 NOTE — Progress Notes (Signed)
Pt checked bld glucose 48 given 2 juices and ordering breakfast.

## 2016-05-02 NOTE — Discharge Instructions (Signed)
Postpartum Hypertension  Postpartum hypertension is high blood pressure after pregnancy that remains higher than normal for more than two days after delivery. You may not realize that you have postpartum hypertension if your blood pressure is not being checked regularly. In some cases, postpartum hypertension will go away on its own, usually within a week of delivery. However, for some women, medical treatment is required to prevent serious complications, such as seizures or stroke.  The following things can affect your blood pressure:  · The type of delivery you had.  · Having received IV fluids or other medicines during or after delivery.    What are the causes?  Postpartum hypertension may be caused by any of the following or by a combination of any of the following:  · Hypertension that existed before pregnancy (chronic hypertension).  · Gestational hypertension.  · Preeclampsia or eclampsia.  · Receiving a lot of fluid through an IV during or after delivery.  · Medicines.  · HELLP syndrome.  · Hyperthyroidism.  · Stroke.  · Other rare neurological or blood disorders.    In some cases, the cause may not be known.  What increases the risk?  Postpartum hypertension can be related to one or more risk factors, such as:  · Chronic hypertension. In some cases, this may not have been diagnosed before pregnancy.  · Obesity.  · Type 2 diabetes.  · Kidney disease.  · Family history of preeclampsia.  · Other medical conditions that cause hormonal imbalances.    What are the signs or symptoms?  As with all types of hypertension, postpartum hypertension may not have any symptoms. Depending on how high your blood pressure is, you may experience:  · Headaches. These may be mild, moderate, or severe. They may also be steady, constant, or sudden in onset (thunderclap headache).  · Visual changes.  · Dizziness.  · Shortness of breath.  · Swelling of your hands, feet, lower legs, or face. In some cases, you may have swelling in  more than one of these locations.  · Heart palpitations or a racing heartbeat.  · Difficulty breathing while lying down.  · Decreased urination.    Other rare signs and symptoms may include:  · Sweating more than usual. This lasts longer than a few days after delivery.  · Chest pain.  · Sudden dizziness when you get up from sitting or lying down.  · Seizures.  · Nausea or vomiting.  · Abdominal pain.    How is this diagnosed?  The diagnosis of postpartum hypertension is made through a combination of physical examination findings and testing of your blood and urine. You may also have additional tests, such as a CT scan or an MRI, to check for other complications of postpartum hypertension.  How is this treated?  When blood pressure is high enough to require treatment, your options may include:  · Medicines to reduce blood pressure (antihypertensives). Tell your health care provider if you are breastfeeding or if you plan to breastfeed. There are many antihypertensive medicines that are safe to take while breastfeeding.  · Stopping medicines that may be causing hypertension.  · Treating medical conditions that are causing hypertension.  · Treating the complications of hypertension, such as seizures, stroke, or kidney problems.    Your health care provider will also continue to monitor your blood pressure closely and repeatedly until it is within a safe range for you.  Follow these instructions at home:  · Take medicines only   as directed by your health care provider.  · Get regular exercise after your health care provider tells you that it is safe.  · Follow your health care provider’s recommendations on fluid and salt restrictions.  · Do not use any tobacco products, including cigarettes, chewing tobacco, or electronic cigarettes. If you need help quitting, ask your health care provider.  · Keep all follow-up visits as directed by your health care provider. This is important.  Contact a health care provider  if:  · Your symptoms get worse.  · You have new symptoms, such as:  ? Headache.  ? Dizziness.  ? Visual changes.  Get help right away if:  · You develop a severe or sudden headache.  · You have seizures.  · You develop numbness or weakness on one side of your body.  · You have difficulty thinking, speaking, or swallowing.  · You develop severe abdominal pain.  · You develop difficulty breathing, chest pain, a racing heartbeat, or heart palpitations.  These symptoms may represent a serious problem that is an emergency. Do not wait to see if the symptoms will go away. Get medical help right away. Call your local emergency services (911 in the [place] Do not drive yourself to the hospital.  This information is not intended to replace advice given to you by your health care provider. Make sure you discuss any questions you have with your health care provider.  Document Released: 09/29/2013 Document Revised: 07/01/2015 Document Reviewed: 08/10/2013  Elsevier Interactive Patient Education © 2017 [employer] Inc[employer]

## 2016-05-04 ENCOUNTER — Other Ambulatory Visit: Payer: Self-pay

## 2016-05-04 ENCOUNTER — Encounter: Payer: Self-pay | Admitting: Obstetrics & Gynecology

## 2016-05-04 ENCOUNTER — Ambulatory Visit: Payer: 59 | Admitting: Obstetrics & Gynecology

## 2016-05-04 VITALS — BP 116/72 | HR 69 | Ht 63.0 in | Wt 223.0 lb

## 2016-05-04 DIAGNOSIS — Z0130 Encounter for examination of blood pressure without abnormal findings: Secondary | ICD-10-CM

## 2016-05-04 NOTE — Progress Notes (Signed)
   Subjective:    Patient ID: Valerie Dennis, female    DOB: 1985/03/27, 31 y.o.   MRN: [id number]  HPI 31 yo MW lady here about a week after a PLTCS for CPD, IOL for pre eclampsia. She had to be readmitted about 2 days after discharge home with worsening BPs. She is here for a BP check. She is taking aldomet 500 mg BID. She is pumping as baby is having a hard time with latching. Baby is getting about 90% breast milk with some formula supplementation.  She is having no problems.   Review of Systems     Objective:   Physical Exam [id number] Breathing, conversing, and ambulating normally Incision- c/d/i       Assessment & Plan:  BP is low so I will have her stop the aldomet and re check it in another week.

## 2016-05-04 NOTE — Patient Outreach (Signed)
[facility] Care Management  05/04/2016  Frazier ButtStephanie Galant 1985/12/10 [id number]   Telephone call for transition of care call.  Member was hospitalized on 04/21/16 and had c-section delivery on 04/24/16 and was discharged on 04/28/16.Member was rehospitalizated on 04/30/16 with dx of postpartum preeclampsia. No answer and message left. Plan to call tomorrow if call not returned. Dudley MajorMelissa Field Staniszewski RN, [facility],CCM Care Management Coordinator-Link to [facility] [phone]

## 2016-05-05 ENCOUNTER — Other Ambulatory Visit: Payer: Self-pay

## 2016-05-05 ENCOUNTER — Ambulatory Visit: Payer: Self-pay

## 2016-05-05 NOTE — Lactation Note (Addendum)
This note was copied from a baby's chart. Lactation Consult  Mother's reason for visit:  Desires tips to improve BF Visit Type:  OP Appointment Notes: Mother desires to BF Valerie Dennis and wants to give BF another try. Valerie Dennis has been bottle feeding Expressed BM for the past 4 days. Today a #24 NS was applied and initially Valerie Dennis bit her mother. Removed her from the breast, performed tongue exercises and tummy time. She then attached and suckled. Mom was more comfortable. Breast compression was used to keep Valerie Dennis engaged.  She was at the breast about 15 minutes and transferred 6 ml. Repositioned but she would not suckle again. Fed 60 ml formula. Showed dad how to perform check squeezes and use tug o'war to improve suck.  Mom post pumped 2.5 oz.  Her nipples are scabbed so flange size was increased and shaft was lubricated. Mother reported increased comfort.  Valerie Dennis is 11 days of life and eating 45-60 ml. Weight increase on the low end of normal the past 4 days. Output is appropriate. Encouraged parents to feed her 2-3 oz every 2-3 hours for a total of 24 oz. Goal is to feed her 27-32 oz a day.  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 She is biting, has poor transfer and is causing nipple trauma. Parents are deciding if they want to bottle feed breast milk or have the tongue tie evaluated.Plan for now is to feed the baby and support milk supply. Consult:  Follow-Up Lactation Consultant:  Soyla DryerJoseph, Odin Mariani  ________________________________________________________________________  Joan FloresBaby's Name:  Valerie Dennis Date of Birth:  04/24/2015 Pediatrician:  Dr. Dixie DialsSilkstone Gender:  female Gestational Age: 3280w0d (At Birth) Birth Weight:  8 lb 7.3 oz (3835 g) Weight at Discharge:                          Date of Discharge:   There were no vitals filed for this visit. [facility]  7# 14.8  Location:Pediatrician's office  05/02/2016 Weight today:  8#1.4 oz  ________________________________________________________________________  Mother's Name: Valerie Dennis Tisdel Type of delivery:  C-Section, Low Transverse Breastfeeding Experience:  Cesarean section Maternal Medical Conditions:  pre-eclampsia, diabetic Maternal Medications:  Ibuprofen, Aldomet, insulin  ________________________________________________________________________  Breastfeeding History (Post Discharge)  Frequency of breastfeeding:   Duration of feeding:    Expressed breast milk 7-8 times in 24 hours Pumping 7 times in 24 hours yields 50-100 ml

## 2016-05-05 NOTE — Patient Outreach (Signed)
[facility] Care Management  05/05/2016  Valerie ButtStephanie Cowman [birth year] [id number]   Telephone call for transition of care call. Attempt# 2 Member was hospitalized on 04/21/16 and had c-section delivery on 04/24/16 and was discharged on 04/28/16.Member was rehospitalizated on 04/30/16 with dx of postpartum preeclampsia. No answer and message left. Plan to call tomorrow if call not returned. Dudley MajorMelissa Kamir Selover RN, [facility],CCM Care Management Coordinator-[facility] [phone]

## 2016-05-06 ENCOUNTER — Other Ambulatory Visit: Payer: Self-pay

## 2016-05-06 NOTE — Patient Outreach (Signed)
[facility]) Care Management  05/06/2016  Valerie ButtStephanie Dennis Oct 24, 1985 [id number]   Telephone call for transition of care call. Attempt# 3 Member was hospitalized on 04/21/16 and had c-section delivery on 04/24/16 and was discharged on 04/28/16.Member was rehospitalizated on 04/30/16 with dx of postpartum preeclampsia. No answer and message left. Plan to send unsuccessful out reach letter.  Member has Link to Wellness visit scheduled for 05/19/16.[name]. Melissa Sandlin RN, BSN,CCM Care Management Coordinator-Link to Wellness [facility] Care Management [phone]

## 2016-05-13 MED FILL — CONTOUR NEXT STRIPS: 30 days supply | Qty: 200 | Fill #3

## 2016-05-13 MED FILL — HUMALOG 100 UNITS/ML KWIKPE: 100 | 30 days supply | Qty: 45 | Fill #2

## 2016-05-18 ENCOUNTER — Telehealth: Payer: Self-pay | Admitting: *Deleted

## 2016-05-18 NOTE — Telephone Encounter (Signed)
Pt called stating that she wanted to get a RX for Mircette sent to [facility] for when she stops breast feeding.  I explained that we usually dont give OCP's or BC until their PP visit.  Left instructions for patient to call office if she was told anything different.

## 2016-05-18 NOTE — Telephone Encounter (Signed)
Pt called requesting something for increase in milk production since she is breast feeding.  Referred her to [facility] in [place] and suggested Fenugreek .  This would be the first line of therapy.  Also encouraged to drink at least 64 oz of water per day.

## 2016-05-19 ENCOUNTER — Other Ambulatory Visit: Payer: Self-pay

## 2016-05-19 VITALS — BP 120/88 | HR 63 | Resp 16 | Ht 64.0 in | Wt 208.6 lb

## 2016-05-19 DIAGNOSIS — E109 Type 1 diabetes mellitus without complications: Secondary | ICD-10-CM

## 2016-05-19 DIAGNOSIS — E108 Type 1 diabetes mellitus with unspecified complications: Secondary | ICD-10-CM | POA: Diagnosis not present

## 2016-05-19 NOTE — Patient Instructions (Addendum)
Plan to eat 3 meals and try eating more protein Plan to walk 2 times a week 30 minutes     Plan to pick up B/P monitor at outpatient pharmacy Plan to schedule appt with Dr. Claiborne Rigg Plan to return to Link to Wellness on 08/20/16 at [facility]

## 2016-05-19 NOTE — Patient Outreach (Signed)
[facility]) Care Management   05/19/2016  Valerie Dennis 08/18/1985 [id number]  Valerie Dennis is an 31 y.o. female.   Member seen for follow up office visit for Link to Wellness program for self management of Type 1 diabetes  Subjective: Member states she is slowly healing from her C-section.  States her B/P has been good since she came home from her second hospitalization for preeclampsia.  States she saw her OB/GYN for follow up and she told her she could stop the methyldopa if she wanted but she is still taking.  States she is using her insulin pump in Auto Mode now and her blood sugars are ranging 120-150 most of the time.  She has only had a few low readings of 50-60 but her pump stopped as it is programed to do.  States she does not have an appt scheduled with her endocrinologist as her visit was cancelled because she in the hospital for her delivery.  States her appetite is decreased but she is trying to eat healthy to help with her breast milk production.    Objective:   ROS  Physical Exam Today's Vitals   05/19/16 1338 05/19/16 1344  BP: 120/88   Pulse: 63   Resp: 16   SpO2: 98%   Weight: 208 lb 9.6 oz (94.6 kg)   Height: 1.626 m ( )   PainSc: 2  2    Encounter Medications:   Outpatient Encounter Prescriptions as of 05/19/2016  Medication Sig Note  . BAYER CONTOUR NEXT TEST test strip    . Insulin Human (INSULIN PUMP) SOLN Inject 1 each into the skin continuous. Humalog insulin in pump, up to 150 units per day   . insulin lispro (HUMALOG) 100 UNIT/ML KiwkPen as needed 04/22/2016: Pt removes insulin from [place] and adds to pump  . methyldopa (ALDOMET) 250 MG tablet Take 250 mg by mouth 2 (two) times daily.   . Prenatal Vit-Fe Fumarate-FA (PRENATAL MULTIVITAMIN) TABS tablet Take 1 tablet by mouth daily at 12 noon.   . [DISCONTINUED] ibuprofen (ADVIL,MOTRIN) 600 MG tablet Take 1 tablet (600 mg total) by mouth every 6 (six) hours. (Patient not taking:  Reported on 05/19/2016)   . [DISCONTINUED] methyldopa (ALDOMET) 500 MG tablet Take 1 tablet (500 mg total) by mouth 2 (two) times daily. (Patient not taking: Reported on 05/19/2016)   . [DISCONTINUED] oxyCODONE (OXY IR/ROXICODONE) 5 MG immediate release tablet Take 1 tablet (5 mg total) by mouth every 4 (four) hours as needed (pain scale 4-7). (Patient not taking: Reported on 05/19/2016)   . [DISCONTINUED] senna-docusate (SENOKOT-S) 8.6-50 MG tablet Take 2 tablets by mouth daily. (Patient not taking: Reported on 05/19/2016)    No facility-administered encounter medications on file as of 05/19/2016.     Functional Status:   In your present state of health, do you have any difficulty performing the following activities: 05/19/2016 04/30/2016  Hearing? N N  Vision? N N  Difficulty concentrating or making decisions? N N  Walking or climbing stairs? N N  Dressing or bathing? N N  Doing errands, shopping? N N  Some recent data might be hidden    Fall/Depression Screening:    PHQ 2/9 Scores 05/19/2016 02/06/2016 10/28/2015 06/24/2015 04/23/2015 03/14/2015 02/18/2015  PHQ - 2 Score 0 0 0 0 0 0 0    Assessment:Member seen for follow up office visit for Link to Wellness program for self management of Type 1 diabetes. She is  meeting Hemoglobin A1C goal of below 7%  with hemoglobin A1C of 6.4%% Member now has the the Medtronic MiniMed 670G with Enlite sensor is using in Auto Mode.  Member was hospitalized on 04/21/16 and had c-section delivery on 04/24/16 and was discharged on 04/28/16.Member was rehospitalizated on 04/30/16 with dx of postpartum preeclampsia.  Member B/P has returned to normal range.  Member has B/P machine but reports it being old.  Given voucher for new B/P monitor.   Member is watching her CHO intake and giving her bolus accordingly. Member has  been slowly walking for exercise.  Member needs to schedule follow up with endocrinologist.    Plan:  Plan to eat 3 meals and try eating more  protein Plan to walk 2 times a week 30 minutes  Plan to pick up B/P monitor at outpatient pharmacy Plan to schedule appt with Dr. Claiborne Rigg Plan to return to Link to Wellness on 08/20/16 at [facility]   [facility] CM Care Plan Problem One     Most Recent Value  Care Plan Problem One  Elevated blood sugars related to dx of Type 1 DM  Role Documenting the Problem One  Care Management Coordinator  Care Plan for Problem One  Active  [facility] Long Term Goal (31-90 days)  Member will maintain hemoglobin A1C of 7 or below for the next 90 days  [facility] Long Term Goal Start Date  05/19/16 [Continue hemoglobin A1C down to 6.4%]  Interventions for Problem One Long Term Goal  Encouraged to eat protein with her meals to help with wound healing and to help maintain her blood sugars,  Reinforced CHO counting and portion control and to try to give insulin bolus based on her CHO intake, Instructed to keep her insulin pump in auto mode, Reinforced on importance of regular exercise for glycemic control and to walk as tolerated Instructed to call to make follow up appt with her endocrinologist, Given voucher for B/P cuff    [name] RN, [facility] Care Management Coordinator-Link to Wellness [facility] Care Management [phone]

## 2016-05-20 DIAGNOSIS — Z794 Long term (current) use of insulin: Secondary | ICD-10-CM | POA: Diagnosis not present

## 2016-05-20 DIAGNOSIS — E1065 Type 1 diabetes mellitus with hyperglycemia: Secondary | ICD-10-CM | POA: Diagnosis not present

## 2016-05-20 DIAGNOSIS — M65321 Trigger finger, right index finger: Secondary | ICD-10-CM | POA: Diagnosis not present

## 2016-05-20 DIAGNOSIS — Z880 Allergy status to penicillin: Secondary | ICD-10-CM | POA: Diagnosis not present

## 2016-05-20 DIAGNOSIS — G5601 Carpal tunnel syndrome, right upper limb: Secondary | ICD-10-CM | POA: Insufficient documentation

## 2016-05-20 DIAGNOSIS — M654 Radial styloid tenosynovitis [de Quervain]: Secondary | ICD-10-CM | POA: Diagnosis not present

## 2016-05-20 DIAGNOSIS — I10 Essential (primary) hypertension: Secondary | ICD-10-CM | POA: Diagnosis not present

## 2016-05-20 DIAGNOSIS — E103293 Type 1 diabetes mellitus with mild nonproliferative diabetic retinopathy without macular edema, bilateral: Secondary | ICD-10-CM | POA: Diagnosis not present

## 2016-05-20 DIAGNOSIS — E785 Hyperlipidemia, unspecified: Secondary | ICD-10-CM | POA: Diagnosis not present

## 2016-05-25 DIAGNOSIS — M65321 Trigger finger, right index finger: Secondary | ICD-10-CM | POA: Diagnosis not present

## 2016-05-25 DIAGNOSIS — G5601 Carpal tunnel syndrome, right upper limb: Secondary | ICD-10-CM | POA: Diagnosis not present

## 2016-05-25 DIAGNOSIS — M654 Radial styloid tenosynovitis [de Quervain]: Secondary | ICD-10-CM | POA: Diagnosis not present

## 2016-06-02 DIAGNOSIS — I10 Essential (primary) hypertension: Secondary | ICD-10-CM | POA: Diagnosis not present

## 2016-06-02 DIAGNOSIS — E1065 Type 1 diabetes mellitus with hyperglycemia: Secondary | ICD-10-CM | POA: Diagnosis not present

## 2016-06-02 DIAGNOSIS — M654 Radial styloid tenosynovitis [de Quervain]: Secondary | ICD-10-CM | POA: Diagnosis not present

## 2016-06-02 DIAGNOSIS — M65321 Trigger finger, right index finger: Secondary | ICD-10-CM | POA: Diagnosis not present

## 2016-06-02 DIAGNOSIS — E103293 Type 1 diabetes mellitus with mild nonproliferative diabetic retinopathy without macular edema, bilateral: Secondary | ICD-10-CM | POA: Diagnosis not present

## 2016-06-02 DIAGNOSIS — G5601 Carpal tunnel syndrome, right upper limb: Secondary | ICD-10-CM | POA: Diagnosis not present

## 2016-06-02 DIAGNOSIS — H52223 Regular astigmatism, bilateral: Secondary | ICD-10-CM | POA: Diagnosis not present

## 2016-06-02 DIAGNOSIS — E785 Hyperlipidemia, unspecified: Secondary | ICD-10-CM | POA: Diagnosis not present

## 2016-06-02 DIAGNOSIS — F909 Attention-deficit hyperactivity disorder, unspecified type: Secondary | ICD-10-CM | POA: Diagnosis not present

## 2016-06-02 MED FILL — HYDROCODON-APAP 5-325: 5-325 | 4 days supply | Qty: 20 | Fill #0

## 2016-06-08 ENCOUNTER — Ambulatory Visit (INDEPENDENT_AMBULATORY_CARE_PROVIDER_SITE_OTHER): Payer: 59 | Admitting: Obstetrics & Gynecology

## 2016-06-08 ENCOUNTER — Encounter: Payer: Self-pay | Admitting: Obstetrics & Gynecology

## 2016-06-08 DIAGNOSIS — Z8759 Personal history of other complications of pregnancy, childbirth and the puerperium: Secondary | ICD-10-CM | POA: Insufficient documentation

## 2016-06-08 MED ORDER — ZOLPIDEM TARTRATE 5 MG PO TABS: 10.0000 mg | ORAL_TABLET | Freq: Every evening | ORAL | 0 refills | Status: DC | PRN

## 2016-06-08 MED ORDER — DESOGESTREL-ETHINYL ESTRADIOL 0.15-0.02/0.01 MG (21/5) PO TABS: 1.0000 | ORAL_TABLET | Freq: Every day | ORAL | 11 refills | Status: AC

## 2016-06-08 MED FILL — BD PEN NDL NANO 32GX5/32": 32G X 4 MM | 30 days supply | Qty: 300 | Fill #0

## 2016-06-08 MED FILL — BD PEN NDL NANO 32GX5/32: 32G X 4 MM | 30 days supply | Qty: 300 | Fill #0

## 2016-06-08 MED FILL — CONTOUR NEXT STRIPS: 38 days supply | Qty: 300 | Fill #0

## 2016-06-08 MED FILL — DESOGESTR-ETH ESTRAD ETH ES: 0.15-0.02/0 | 84 days supply | Qty: 84 | Fill #0

## 2016-06-08 MED FILL — HUMALOG 100 UNITS/ML KWIKPE: 100 | 30 days supply | Qty: 45 | Fill #3

## 2016-06-08 NOTE — Progress Notes (Signed)
Post Partum Exam  Valerie Dennis is a 31 y.o. G37P1001 female who presents for a postpartum visit. She is 6 weeks postpartum following a low cervical transverse Cesarean section. I have fully reviewed the prenatal and intrapartum course. The delivery was at 39 gestational weeks.  Anesthesia: epidural. Postpartum course has been unremarkable except for Rt carpal tunnel surgery 06/02/16. Baby's course has been unremarkable except for tongue-tied. Baby is feeding by both breast and bottle - Enfamil Inspire. Bleeding no bleeding. Bowel function is normal. Bladder function is normal. Patient is sexually active. Contraception method is Mircette OCP. Postpartum depression screening:neg  The following portions of the patient's history were reviewed and updated as appropriate: allergies, current medications, past family history, past medical history, past social history, past surgical history and problem list.  Review of Systems Pertinent items noted in HPI and remainder of comprehensive ROS otherwise negative.    Objective:  Blood pressure 127/84, pulse 87, resp. rate 16, height  (1.626 m), weight 206 lb (93.4 kg), currently breastfeeding.  General:  alert, cooperative and no distress   Breasts:  negative  Lungs: clear to auscultation bilaterally  Heart:  reg rate  Abdomen: soft, non-tender; bowel sounds normal; no masses,  no organomegaly   Vulva:  not evaluated  Vagina: not evaluated  Cervix:  not evaluated  Corpus: not evaluated  Adnexa:  not evaluated  Rectal Exam: Not performed.       Left Arm:  In a cast; extreme sensitivity of index finger Assessment:    Nml postpartum exam. PP depression score 10  Plan:   1. Contraception: condoms; will go on Mircette when stops breast feeding 2. Watch for signs of pp depression; reviewed with both mom and dad 3.  Call hand surgeon to discuss hand symptoms 4.  Ambien for sleep 3. Follow up in: 1 year or as needed.

## 2016-06-16 DIAGNOSIS — G5601 Carpal tunnel syndrome, right upper limb: Secondary | ICD-10-CM | POA: Diagnosis not present

## 2016-06-16 DIAGNOSIS — M79641 Pain in right hand: Secondary | ICD-10-CM | POA: Diagnosis not present

## 2016-06-19 DIAGNOSIS — E108 Type 1 diabetes mellitus with unspecified complications: Secondary | ICD-10-CM | POA: Diagnosis not present

## 2016-06-23 DIAGNOSIS — M79641 Pain in right hand: Secondary | ICD-10-CM | POA: Diagnosis not present

## 2016-06-23 DIAGNOSIS — G5601 Carpal tunnel syndrome, right upper limb: Secondary | ICD-10-CM | POA: Diagnosis not present

## 2016-06-25 DIAGNOSIS — M79641 Pain in right hand: Secondary | ICD-10-CM | POA: Diagnosis not present

## 2016-06-25 DIAGNOSIS — G5601 Carpal tunnel syndrome, right upper limb: Secondary | ICD-10-CM | POA: Diagnosis not present

## 2016-06-29 DIAGNOSIS — Z79899 Other long term (current) drug therapy: Secondary | ICD-10-CM | POA: Diagnosis not present

## 2016-06-29 DIAGNOSIS — E785 Hyperlipidemia, unspecified: Secondary | ICD-10-CM | POA: Diagnosis not present

## 2016-06-29 DIAGNOSIS — F909 Attention-deficit hyperactivity disorder, unspecified type: Secondary | ICD-10-CM | POA: Diagnosis not present

## 2016-06-29 DIAGNOSIS — I10 Essential (primary) hypertension: Secondary | ICD-10-CM | POA: Diagnosis not present

## 2016-06-29 DIAGNOSIS — E6609 Other obesity due to excess calories: Secondary | ICD-10-CM | POA: Insufficient documentation

## 2016-06-29 DIAGNOSIS — E1065 Type 1 diabetes mellitus with hyperglycemia: Secondary | ICD-10-CM | POA: Diagnosis not present

## 2016-06-29 DIAGNOSIS — E109 Type 1 diabetes mellitus without complications: Secondary | ICD-10-CM | POA: Diagnosis not present

## 2016-06-29 DIAGNOSIS — E663 Overweight: Secondary | ICD-10-CM | POA: Insufficient documentation

## 2016-06-29 DIAGNOSIS — Z23 Encounter for immunization: Secondary | ICD-10-CM | POA: Diagnosis not present

## 2016-06-29 DIAGNOSIS — Z9641 Presence of insulin pump (external) (internal): Secondary | ICD-10-CM | POA: Diagnosis not present

## 2016-06-29 DIAGNOSIS — Z6835 Body mass index (BMI) 35.0-35.9, adult: Secondary | ICD-10-CM | POA: Diagnosis not present

## 2016-06-29 DIAGNOSIS — Z0000 Encounter for general adult medical examination without abnormal findings: Secondary | ICD-10-CM | POA: Diagnosis not present

## 2016-06-30 DIAGNOSIS — M79641 Pain in right hand: Secondary | ICD-10-CM | POA: Diagnosis not present

## 2016-06-30 DIAGNOSIS — G5601 Carpal tunnel syndrome, right upper limb: Secondary | ICD-10-CM | POA: Diagnosis not present

## 2016-07-01 DIAGNOSIS — E108 Type 1 diabetes mellitus with unspecified complications: Secondary | ICD-10-CM | POA: Diagnosis not present

## 2016-07-01 DIAGNOSIS — Z794 Long term (current) use of insulin: Secondary | ICD-10-CM | POA: Diagnosis not present

## 2016-07-03 MED FILL — HUMALOG 100 UNITS/ML KWIKPE: 100 | 30 days supply | Qty: 45 | Fill #0

## 2016-07-07 DIAGNOSIS — G5601 Carpal tunnel syndrome, right upper limb: Secondary | ICD-10-CM | POA: Diagnosis not present

## 2016-07-07 DIAGNOSIS — M79641 Pain in right hand: Secondary | ICD-10-CM | POA: Diagnosis not present

## 2016-07-09 MED FILL — CONTOUR NEXT STRIPS: 38 days supply | Qty: 300 | Fill #1

## 2016-07-09 MED FILL — ENALAPRIL MALEATE 10 MG TAB: 10 | 90 days supply | Qty: 90 | Fill #0

## 2016-07-10 DIAGNOSIS — M25532 Pain in left wrist: Secondary | ICD-10-CM | POA: Diagnosis not present

## 2016-07-10 DIAGNOSIS — M654 Radial styloid tenosynovitis [de Quervain]: Secondary | ICD-10-CM | POA: Diagnosis not present

## 2016-07-10 DIAGNOSIS — M65321 Trigger finger, right index finger: Secondary | ICD-10-CM | POA: Diagnosis not present

## 2016-07-10 DIAGNOSIS — M778 Other enthesopathies, not elsewhere classified: Secondary | ICD-10-CM | POA: Diagnosis not present

## 2016-07-10 DIAGNOSIS — M79641 Pain in right hand: Secondary | ICD-10-CM | POA: Diagnosis not present

## 2016-07-10 DIAGNOSIS — G5601 Carpal tunnel syndrome, right upper limb: Secondary | ICD-10-CM | POA: Diagnosis not present

## 2016-07-10 MED FILL — D-AMPHETAMINE ER 15 MG CAP: 15 | 30 days supply | Qty: 120 | Fill #0

## 2016-07-13 ENCOUNTER — Encounter: Payer: Self-pay | Admitting: Obstetrics & Gynecology

## 2016-07-13 DIAGNOSIS — M79641 Pain in right hand: Secondary | ICD-10-CM | POA: Diagnosis not present

## 2016-07-13 DIAGNOSIS — M654 Radial styloid tenosynovitis [de Quervain]: Secondary | ICD-10-CM | POA: Diagnosis not present

## 2016-07-13 DIAGNOSIS — G5601 Carpal tunnel syndrome, right upper limb: Secondary | ICD-10-CM | POA: Diagnosis not present

## 2016-07-13 DIAGNOSIS — M65321 Trigger finger, right index finger: Secondary | ICD-10-CM | POA: Diagnosis not present

## 2016-07-13 DIAGNOSIS — M25532 Pain in left wrist: Secondary | ICD-10-CM | POA: Diagnosis not present

## 2016-07-13 DIAGNOSIS — M778 Other enthesopathies, not elsewhere classified: Secondary | ICD-10-CM | POA: Diagnosis not present

## 2016-07-16 DIAGNOSIS — G5601 Carpal tunnel syndrome, right upper limb: Secondary | ICD-10-CM | POA: Diagnosis not present

## 2016-07-16 DIAGNOSIS — M654 Radial styloid tenosynovitis [de Quervain]: Secondary | ICD-10-CM | POA: Diagnosis not present

## 2016-07-16 DIAGNOSIS — M65321 Trigger finger, right index finger: Secondary | ICD-10-CM | POA: Diagnosis not present

## 2016-07-16 DIAGNOSIS — M25532 Pain in left wrist: Secondary | ICD-10-CM | POA: Diagnosis not present

## 2016-07-16 DIAGNOSIS — M778 Other enthesopathies, not elsewhere classified: Secondary | ICD-10-CM | POA: Diagnosis not present

## 2016-07-16 DIAGNOSIS — M79641 Pain in right hand: Secondary | ICD-10-CM | POA: Diagnosis not present

## 2016-07-21 ENCOUNTER — Encounter: Payer: Self-pay | Admitting: *Deleted

## 2016-07-21 DIAGNOSIS — G5601 Carpal tunnel syndrome, right upper limb: Secondary | ICD-10-CM | POA: Diagnosis not present

## 2016-07-21 DIAGNOSIS — M65321 Trigger finger, right index finger: Secondary | ICD-10-CM | POA: Diagnosis not present

## 2016-07-21 DIAGNOSIS — E108 Type 1 diabetes mellitus with unspecified complications: Secondary | ICD-10-CM | POA: Diagnosis not present

## 2016-07-21 DIAGNOSIS — M25532 Pain in left wrist: Secondary | ICD-10-CM | POA: Diagnosis not present

## 2016-07-21 DIAGNOSIS — M778 Other enthesopathies, not elsewhere classified: Secondary | ICD-10-CM | POA: Diagnosis not present

## 2016-07-21 DIAGNOSIS — M654 Radial styloid tenosynovitis [de Quervain]: Secondary | ICD-10-CM | POA: Diagnosis not present

## 2016-07-21 DIAGNOSIS — M79641 Pain in right hand: Secondary | ICD-10-CM | POA: Diagnosis not present

## 2016-07-22 ENCOUNTER — Encounter: Payer: Self-pay | Admitting: Obstetrics & Gynecology

## 2016-07-23 ENCOUNTER — Encounter: Payer: Self-pay | Admitting: *Deleted

## 2016-07-23 DIAGNOSIS — M654 Radial styloid tenosynovitis [de Quervain]: Secondary | ICD-10-CM | POA: Diagnosis not present

## 2016-07-23 DIAGNOSIS — M65321 Trigger finger, right index finger: Secondary | ICD-10-CM | POA: Diagnosis not present

## 2016-07-23 DIAGNOSIS — M778 Other enthesopathies, not elsewhere classified: Secondary | ICD-10-CM | POA: Diagnosis not present

## 2016-07-23 DIAGNOSIS — M25532 Pain in left wrist: Secondary | ICD-10-CM | POA: Diagnosis not present

## 2016-07-23 DIAGNOSIS — G5601 Carpal tunnel syndrome, right upper limb: Secondary | ICD-10-CM | POA: Diagnosis not present

## 2016-07-23 DIAGNOSIS — M79641 Pain in right hand: Secondary | ICD-10-CM | POA: Diagnosis not present

## 2016-07-28 DIAGNOSIS — M79641 Pain in right hand: Secondary | ICD-10-CM | POA: Diagnosis not present

## 2016-07-28 DIAGNOSIS — M25532 Pain in left wrist: Secondary | ICD-10-CM | POA: Diagnosis not present

## 2016-07-28 DIAGNOSIS — M654 Radial styloid tenosynovitis [de Quervain]: Secondary | ICD-10-CM | POA: Diagnosis not present

## 2016-07-28 DIAGNOSIS — M778 Other enthesopathies, not elsewhere classified: Secondary | ICD-10-CM | POA: Diagnosis not present

## 2016-07-28 DIAGNOSIS — G5601 Carpal tunnel syndrome, right upper limb: Secondary | ICD-10-CM | POA: Diagnosis not present

## 2016-07-28 DIAGNOSIS — M65321 Trigger finger, right index finger: Secondary | ICD-10-CM | POA: Diagnosis not present

## 2016-07-30 DIAGNOSIS — M65321 Trigger finger, right index finger: Secondary | ICD-10-CM | POA: Diagnosis not present

## 2016-07-30 DIAGNOSIS — M79641 Pain in right hand: Secondary | ICD-10-CM | POA: Diagnosis not present

## 2016-07-30 DIAGNOSIS — M25532 Pain in left wrist: Secondary | ICD-10-CM | POA: Diagnosis not present

## 2016-07-30 DIAGNOSIS — M654 Radial styloid tenosynovitis [de Quervain]: Secondary | ICD-10-CM | POA: Diagnosis not present

## 2016-07-30 DIAGNOSIS — M778 Other enthesopathies, not elsewhere classified: Secondary | ICD-10-CM | POA: Diagnosis not present

## 2016-07-30 DIAGNOSIS — G5601 Carpal tunnel syndrome, right upper limb: Secondary | ICD-10-CM | POA: Diagnosis not present

## 2016-08-03 DIAGNOSIS — H5213 Myopia, bilateral: Secondary | ICD-10-CM | POA: Diagnosis not present

## 2016-08-03 DIAGNOSIS — E103393 Type 1 diabetes mellitus with moderate nonproliferative diabetic retinopathy without macular edema, bilateral: Secondary | ICD-10-CM | POA: Diagnosis not present

## 2016-08-03 DIAGNOSIS — H52223 Regular astigmatism, bilateral: Secondary | ICD-10-CM | POA: Diagnosis not present

## 2016-08-03 DIAGNOSIS — G43109 Migraine with aura, not intractable, without status migrainosus: Secondary | ICD-10-CM | POA: Diagnosis not present

## 2016-08-03 DIAGNOSIS — H40013 Open angle with borderline findings, low risk, bilateral: Secondary | ICD-10-CM | POA: Diagnosis not present

## 2016-08-04 DIAGNOSIS — M654 Radial styloid tenosynovitis [de Quervain]: Secondary | ICD-10-CM | POA: Diagnosis not present

## 2016-08-04 DIAGNOSIS — M65321 Trigger finger, right index finger: Secondary | ICD-10-CM | POA: Diagnosis not present

## 2016-08-04 DIAGNOSIS — G5601 Carpal tunnel syndrome, right upper limb: Secondary | ICD-10-CM | POA: Diagnosis not present

## 2016-08-04 DIAGNOSIS — M778 Other enthesopathies, not elsewhere classified: Secondary | ICD-10-CM | POA: Diagnosis not present

## 2016-08-04 DIAGNOSIS — M25532 Pain in left wrist: Secondary | ICD-10-CM | POA: Diagnosis not present

## 2016-08-04 DIAGNOSIS — M79641 Pain in right hand: Secondary | ICD-10-CM | POA: Diagnosis not present

## 2016-08-06 MED FILL — HUMALOG 100 UNITS/ML KWIKPE: 100 | 31 days supply | Qty: 48 | Fill #0

## 2016-08-07 DIAGNOSIS — M778 Other enthesopathies, not elsewhere classified: Secondary | ICD-10-CM | POA: Diagnosis not present

## 2016-08-07 DIAGNOSIS — M25532 Pain in left wrist: Secondary | ICD-10-CM | POA: Diagnosis not present

## 2016-08-07 DIAGNOSIS — M79641 Pain in right hand: Secondary | ICD-10-CM | POA: Diagnosis not present

## 2016-08-07 DIAGNOSIS — G5601 Carpal tunnel syndrome, right upper limb: Secondary | ICD-10-CM | POA: Diagnosis not present

## 2016-08-07 DIAGNOSIS — M65321 Trigger finger, right index finger: Secondary | ICD-10-CM | POA: Diagnosis not present

## 2016-08-07 DIAGNOSIS — M654 Radial styloid tenosynovitis [de Quervain]: Secondary | ICD-10-CM | POA: Diagnosis not present

## 2016-08-10 MED FILL — CONTOUR NEXT STRIPS: 38 days supply | Qty: 300 | Fill #2

## 2016-08-10 MED FILL — MOMETASONE FUROATE 50 MCG S: 50 | 30 days supply | Qty: 17 | Fill #0

## 2016-08-14 DIAGNOSIS — M654 Radial styloid tenosynovitis [de Quervain]: Secondary | ICD-10-CM | POA: Diagnosis not present

## 2016-08-14 DIAGNOSIS — G5601 Carpal tunnel syndrome, right upper limb: Secondary | ICD-10-CM | POA: Diagnosis not present

## 2016-08-14 DIAGNOSIS — M65321 Trigger finger, right index finger: Secondary | ICD-10-CM | POA: Diagnosis not present

## 2016-08-14 DIAGNOSIS — E109 Type 1 diabetes mellitus without complications: Secondary | ICD-10-CM | POA: Diagnosis not present

## 2016-08-20 ENCOUNTER — Ambulatory Visit: Payer: Self-pay

## 2016-08-20 MED FILL — TRETINOIN GEL MICRO 0.1% TU: 0.1 | 30 days supply | Qty: 45 | Fill #0

## 2016-08-21 DIAGNOSIS — L709 Acne, unspecified: Secondary | ICD-10-CM | POA: Insufficient documentation

## 2016-08-21 DIAGNOSIS — E108 Type 1 diabetes mellitus with unspecified complications: Secondary | ICD-10-CM | POA: Diagnosis not present

## 2016-08-21 DIAGNOSIS — Z794 Long term (current) use of insulin: Secondary | ICD-10-CM | POA: Diagnosis not present

## 2016-08-25 MED FILL — DESOGESTR-ETH ESTRAD ETH ES: 0.15-0.02/0 | 84 days supply | Qty: 84 | Fill #1

## 2016-08-26 MED FILL — MELOXICAM 7.5 MG TABLET: 7.5 | 30 days supply | Qty: 60 | Fill #0

## 2016-08-26 MED FILL — ZOLPIDEM TARTRATE 5 MG TAB: 5 | 20 days supply | Qty: 20 | Fill #0

## 2016-08-26 MED FILL — DICLOFENAC SODIUM 1% GEL: 1 | 30 days supply | Qty: 100 | Fill #0

## 2016-08-26 MED FILL — IPRATROPIUM 0.03% SPRAY: 0.03 | 30 days supply | Qty: 30 | Fill #0

## 2016-08-27 DIAGNOSIS — M654 Radial styloid tenosynovitis [de Quervain]: Secondary | ICD-10-CM | POA: Diagnosis not present

## 2016-08-27 DIAGNOSIS — E109 Type 1 diabetes mellitus without complications: Secondary | ICD-10-CM | POA: Diagnosis not present

## 2016-08-27 DIAGNOSIS — G5601 Carpal tunnel syndrome, right upper limb: Secondary | ICD-10-CM | POA: Diagnosis not present

## 2016-08-27 DIAGNOSIS — M65321 Trigger finger, right index finger: Secondary | ICD-10-CM | POA: Diagnosis not present

## 2016-08-27 MED FILL — D-AMPHETAMINE ER 15 MG CAP: 15 | 30 days supply | Qty: 120 | Fill #0

## 2016-08-31 DIAGNOSIS — E108 Type 1 diabetes mellitus with unspecified complications: Secondary | ICD-10-CM | POA: Diagnosis not present

## 2016-09-03 DIAGNOSIS — G5601 Carpal tunnel syndrome, right upper limb: Secondary | ICD-10-CM | POA: Diagnosis not present

## 2016-09-03 DIAGNOSIS — E109 Type 1 diabetes mellitus without complications: Secondary | ICD-10-CM | POA: Diagnosis not present

## 2016-09-03 DIAGNOSIS — M65321 Trigger finger, right index finger: Secondary | ICD-10-CM | POA: Diagnosis not present

## 2016-09-03 DIAGNOSIS — M654 Radial styloid tenosynovitis [de Quervain]: Secondary | ICD-10-CM | POA: Diagnosis not present

## 2016-09-07 DIAGNOSIS — E108 Type 1 diabetes mellitus with unspecified complications: Secondary | ICD-10-CM | POA: Diagnosis not present

## 2016-09-14 DIAGNOSIS — G5791 Unspecified mononeuropathy of right lower limb: Secondary | ICD-10-CM | POA: Diagnosis not present

## 2016-09-14 DIAGNOSIS — E109 Type 1 diabetes mellitus without complications: Secondary | ICD-10-CM | POA: Diagnosis not present

## 2016-09-14 DIAGNOSIS — M722 Plantar fascial fibromatosis: Secondary | ICD-10-CM | POA: Diagnosis not present

## 2016-09-14 DIAGNOSIS — G5792 Unspecified mononeuropathy of left lower limb: Secondary | ICD-10-CM | POA: Diagnosis not present

## 2016-09-15 DIAGNOSIS — M79641 Pain in right hand: Secondary | ICD-10-CM | POA: Diagnosis not present

## 2016-09-15 DIAGNOSIS — R110 Nausea: Secondary | ICD-10-CM | POA: Diagnosis not present

## 2016-09-15 MED FILL — TRETINOIN GEL MICRO 0.1% TU: 0.1 | 30 days supply | Qty: 45 | Fill #1

## 2016-09-15 MED FILL — CONTOUR NEXT STRIPS: 38 days supply | Qty: 300 | Fill #3

## 2016-09-15 MED FILL — HUMALOG 100 UNITS/ML KWIKPE: 100 | 31 days supply | Qty: 48 | Fill #1

## 2016-09-15 MED FILL — MOMETASONE FUROATE 50 MCG S: 50 | 30 days supply | Qty: 17 | Fill #1

## 2016-09-16 DIAGNOSIS — Z794 Long term (current) use of insulin: Secondary | ICD-10-CM | POA: Diagnosis not present

## 2016-09-16 DIAGNOSIS — E108 Type 1 diabetes mellitus with unspecified complications: Secondary | ICD-10-CM | POA: Diagnosis not present

## 2016-09-21 MED FILL — MELOXICAM 7.5 MG TABLET: 7.5 | 30 days supply | Qty: 60 | Fill #1

## 2016-09-21 MED FILL — DICLOFENAC SODIUM 1% GEL: 1 | 30 days supply | Qty: 100 | Fill #1

## 2016-09-21 MED FILL — ZOLPIDEM TARTRATE 5 MG TAB: 5 | 15 days supply | Qty: 15 | Fill #0

## 2016-09-21 MED FILL — IPRATROPIUM 0.03% SPRAY: 0.03 | 30 days supply | Qty: 30 | Fill #1

## 2016-09-22 DIAGNOSIS — M778 Other enthesopathies, not elsewhere classified: Secondary | ICD-10-CM | POA: Diagnosis not present

## 2016-09-22 DIAGNOSIS — R110 Nausea: Secondary | ICD-10-CM | POA: Diagnosis not present

## 2016-09-22 DIAGNOSIS — M65321 Trigger finger, right index finger: Secondary | ICD-10-CM | POA: Diagnosis not present

## 2016-09-22 DIAGNOSIS — G5601 Carpal tunnel syndrome, right upper limb: Secondary | ICD-10-CM | POA: Diagnosis not present

## 2016-09-22 DIAGNOSIS — M25532 Pain in left wrist: Secondary | ICD-10-CM | POA: Diagnosis not present

## 2016-09-22 DIAGNOSIS — M79641 Pain in right hand: Secondary | ICD-10-CM | POA: Diagnosis not present

## 2016-09-23 ENCOUNTER — Other Ambulatory Visit: Payer: Self-pay

## 2016-09-23 DIAGNOSIS — E109 Type 1 diabetes mellitus without complications: Secondary | ICD-10-CM

## 2016-09-23 NOTE — Patient Outreach (Signed)
[facility]) Care Management  09/23/2016   Valerie ButtStephanie Dennis 1985-11-05 [id number]   Member called for follow up visit for Link to Wellness program for self management of Type 1 diabetes.  Subjective: Member states she is back to work and her schedule makes it difficult for her to come to the office for a visit.  States she has time today to do a telephone visit.  States she is to see her endocrinologist on 11/24/16.  States she is using her insulin pump in Auto Mode most of the time.  States she is ranging around 150 for her blood sugars.  Denies any recent low or high readings.  States she has not been able to lose weight.  States they are walking around 4 times a week for 45 minutes.    Objective:   Current Medications:  Current Outpatient Prescriptions  Medication Sig Dispense Refill  . BAYER CONTOUR NEXT TEST test strip   11  . desogestrel-ethinyl estradiol (KARIVA) 0.15-0.02/0.01 MG (21/5) tablet Take 1 tablet by mouth daily. 1 Package 11  . dextroamphetamine (DEXEDRINE SPANSULE) 15 MG 24 hr capsule Take 30 mg by mouth 2 (two) times daily.    . enalapril (VASOTEC) 10 MG tablet Take 10 mg by mouth daily.    . Insulin Human (INSULIN PUMP) SOLN Inject 1 each into the skin continuous. Humalog insulin in pump, up to 150 units per day    . insulin lispro (HUMALOG) 100 UNIT/ML KiwkPen as needed    . Prenatal Vit-Fe Fumarate-FA (PRENATAL MULTIVITAMIN) TABS tablet Take 1 tablet by mouth daily at 12 noon. (Patient not taking: Reported on 09/23/2016) 30 tablet 11  . zolpidem (AMBIEN) 5 MG tablet Take 2 tablets (10 mg total) by mouth at bedtime as needed for sleep. 20 tablet 0   No current facility-administered medications for this visit.     Functional Status:  In your present state of health, do you have any difficulty performing the following activities: 09/23/2016 05/19/2016  Hearing? N N  Vision? N N  Difficulty concentrating or making decisions? N N  Walking or climbing  stairs? N N  Dressing or bathing? N N  Doing errands, shopping? N N  Some recent data might be hidden    Fall/Depression Screening: Fall Risk  09/23/2016 05/19/2016 04/21/2016  Falls in the past year? No No No  Comment - - -  Number falls in past yr: - - -  Injury with Fall? - - -  Follow up - - -   PHQ 2/9 Scores 09/23/2016 05/19/2016 02/06/2016 10/28/2015 06/24/2015 04/23/2015 03/14/2015  PHQ - 2 Score 1 0 0 0 0 0 0    Assessment: Member called for follow up visit for Link to Wellness program for self management of Type 1 diabetes. She is meeting Hemoglobin A1C goal of below 7% with hemoglobin A1C of 6.4%% Member is wearing the the Medtronic MiniMed 670G with Enlite sensor is using in Auto Mode.    Member  Reports B/P has returned to normal range.  Member is watching her CHO intake and giving her bolus accordingly.  Reports she is concerned about her weight.  Member to be mailed information on how to join [employer] with the [facility] discount. Member has been walking  for exercise.  Member to see endocrinologist on 11/02/16.    Plan:  Plan to eat 3 meals and try eating more protein Plan to walk 4 times a week for 45 minutes  Plan to look into joining  [employer] Plan to keep appt  with Dr. Claiborne Rigg on 11/24/16 Plan  Link to Wellness call  on 01/12/17 at [facility]    [facility] CM Care Plan Problem One     Most Recent Value  Care Plan Problem One  Elevated blood sugars related to dx of Type 1 DM  Role Documenting the Problem One  Care Management Coordinator  Care Plan for Problem One  Active  [facility] Long Term Goal   Member will maintain hemoglobin A1C of 7 or below for the next 90 days  [facility] Long Term Goal Start Date  09/23/16 [Continue hemoglobin A1C down to 6.4%]  Interventions for Problem One Long Term Goal  Reinforced CHO counting and portion control, Reinforced to keep her insulin pump in auto mode, Discussed weight loss and the [facility] discount for [employer], Plan to send written information  on [employer], Reinforced on importance of regular exercise for glycemic control,  Instructed to keep follow up appt with her endocrinologist    [name] RN, [facility] Care Management Coordinator-Link to Wellness [facility] Care Management [phone]

## 2016-09-23 NOTE — Patient Instructions (Addendum)
1. Plan to eat 3 meals and try eating more protein 2. Plan to walk 4 times a week for 45 minutes  3. Plan to look into joining [employer] 4. Plan to keep appt  with Dr. Claiborne Riggber on 11/24/16 5. Plan   Link to Wellness call  on 01[facility]12/17 at [facility]

## 2016-09-29 DIAGNOSIS — M79641 Pain in right hand: Secondary | ICD-10-CM | POA: Diagnosis not present

## 2016-09-29 DIAGNOSIS — M25532 Pain in left wrist: Secondary | ICD-10-CM | POA: Diagnosis not present

## 2016-09-29 DIAGNOSIS — M654 Radial styloid tenosynovitis [de Quervain]: Secondary | ICD-10-CM | POA: Diagnosis not present

## 2016-09-29 DIAGNOSIS — G5601 Carpal tunnel syndrome, right upper limb: Secondary | ICD-10-CM | POA: Diagnosis not present

## 2016-09-29 DIAGNOSIS — E108 Type 1 diabetes mellitus with unspecified complications: Secondary | ICD-10-CM | POA: Diagnosis not present

## 2016-09-29 DIAGNOSIS — M778 Other enthesopathies, not elsewhere classified: Secondary | ICD-10-CM | POA: Diagnosis not present

## 2016-10-02 DIAGNOSIS — E108 Type 1 diabetes mellitus with unspecified complications: Secondary | ICD-10-CM | POA: Diagnosis not present

## 2016-10-09 DIAGNOSIS — E109 Type 1 diabetes mellitus without complications: Secondary | ICD-10-CM | POA: Diagnosis not present

## 2016-10-09 DIAGNOSIS — I10 Essential (primary) hypertension: Secondary | ICD-10-CM | POA: Diagnosis not present

## 2016-10-09 DIAGNOSIS — E103393 Type 1 diabetes mellitus with moderate nonproliferative diabetic retinopathy without macular edema, bilateral: Secondary | ICD-10-CM | POA: Diagnosis not present

## 2016-10-09 DIAGNOSIS — R110 Nausea: Secondary | ICD-10-CM | POA: Diagnosis not present

## 2016-10-09 DIAGNOSIS — Z9641 Presence of insulin pump (external) (internal): Secondary | ICD-10-CM | POA: Diagnosis not present

## 2016-10-09 MED FILL — HUMALOG 100 UNITS/ML KWIKPE: 100 | 31 days supply | Qty: 48 | Fill #2

## 2016-10-09 MED FILL — TRETINOIN GEL MICRO 0.1% TU: 0.1 | 30 days supply | Qty: 45 | Fill #2

## 2016-10-09 MED FILL — MOMETASONE FUROATE 50 MCG S: 50 | 30 days supply | Qty: 17 | Fill #2

## 2016-10-13 DIAGNOSIS — M65321 Trigger finger, right index finger: Secondary | ICD-10-CM | POA: Diagnosis not present

## 2016-10-13 DIAGNOSIS — M778 Other enthesopathies, not elsewhere classified: Secondary | ICD-10-CM | POA: Diagnosis not present

## 2016-10-13 DIAGNOSIS — G5601 Carpal tunnel syndrome, right upper limb: Secondary | ICD-10-CM | POA: Diagnosis not present

## 2016-10-13 DIAGNOSIS — M79641 Pain in right hand: Secondary | ICD-10-CM | POA: Diagnosis not present

## 2016-10-13 DIAGNOSIS — M654 Radial styloid tenosynovitis [de Quervain]: Secondary | ICD-10-CM | POA: Diagnosis not present

## 2016-10-13 MED FILL — D-AMPHETAMINE ER 15 MG CAP: 15 | 30 days supply | Qty: 120 | Fill #0

## 2016-10-13 MED FILL — GLUCAGON 1 MG EMERGENCY KIT: 1 | 1 days supply | Qty: 1 | Fill #0

## 2016-10-13 MED FILL — ENALAPRIL MALEATE 10 MG TAB: 10 | 90 days supply | Qty: 90 | Fill #1

## 2016-10-16 MED FILL — CONTOUR NEXT STRIPS: 38 days supply | Qty: 300 | Fill #4

## 2016-10-16 MED FILL — IPRATROPIUM 0.03% SPRAY: 0.03 | 30 days supply | Qty: 30 | Fill #2

## 2016-10-16 MED FILL — DICLOFENAC SODIUM 1% GEL: 1 | 30 days supply | Qty: 100 | Fill #2

## 2016-10-20 DIAGNOSIS — M65321 Trigger finger, right index finger: Secondary | ICD-10-CM | POA: Diagnosis not present

## 2016-10-20 DIAGNOSIS — M79641 Pain in right hand: Secondary | ICD-10-CM | POA: Diagnosis not present

## 2016-10-20 DIAGNOSIS — G5601 Carpal tunnel syndrome, right upper limb: Secondary | ICD-10-CM | POA: Diagnosis not present

## 2016-10-20 DIAGNOSIS — M778 Other enthesopathies, not elsewhere classified: Secondary | ICD-10-CM | POA: Diagnosis not present

## 2016-10-20 MED FILL — ZOLPIDEM TARTRATE 5 MG TAB: 5 | 20 days supply | Qty: 20 | Fill #0

## 2016-10-23 DIAGNOSIS — Z794 Long term (current) use of insulin: Secondary | ICD-10-CM | POA: Diagnosis not present

## 2016-10-23 DIAGNOSIS — E108 Type 1 diabetes mellitus with unspecified complications: Secondary | ICD-10-CM | POA: Diagnosis not present

## 2016-10-30 DIAGNOSIS — E108 Type 1 diabetes mellitus with unspecified complications: Secondary | ICD-10-CM | POA: Diagnosis not present

## 2016-11-02 DIAGNOSIS — H40013 Open angle with borderline findings, low risk, bilateral: Secondary | ICD-10-CM | POA: Diagnosis not present

## 2016-11-02 DIAGNOSIS — H52223 Regular astigmatism, bilateral: Secondary | ICD-10-CM | POA: Diagnosis not present

## 2016-11-02 DIAGNOSIS — H5213 Myopia, bilateral: Secondary | ICD-10-CM | POA: Diagnosis not present

## 2016-11-02 DIAGNOSIS — E103393 Type 1 diabetes mellitus with moderate nonproliferative diabetic retinopathy without macular edema, bilateral: Secondary | ICD-10-CM | POA: Diagnosis not present

## 2016-11-02 DIAGNOSIS — G43109 Migraine with aura, not intractable, without status migrainosus: Secondary | ICD-10-CM | POA: Diagnosis not present

## 2016-11-03 MED FILL — MOMETASONE FUROATE 50 MCG S: 50 | 30 days supply | Qty: 17 | Fill #3

## 2016-11-03 MED FILL — TRETINOIN GEL MICRO 0.1% TU: 0.1 | 30 days supply | Qty: 45 | Fill #3

## 2016-11-03 MED FILL — HUMALOG 100 UNITS/ML KWIKPE: 100 | 31 days supply | Qty: 48 | Fill #3

## 2016-11-04 DIAGNOSIS — E103393 Type 1 diabetes mellitus with moderate nonproliferative diabetic retinopathy without macular edema, bilateral: Secondary | ICD-10-CM | POA: Diagnosis not present

## 2016-11-04 DIAGNOSIS — I10 Essential (primary) hypertension: Secondary | ICD-10-CM | POA: Diagnosis not present

## 2016-11-09 DIAGNOSIS — E108 Type 1 diabetes mellitus with unspecified complications: Secondary | ICD-10-CM | POA: Diagnosis not present

## 2016-11-10 MED FILL — IPRATROPIUM 0.03% SPRAY: 0.03 | 30 days supply | Qty: 30 | Fill #3

## 2016-11-10 MED FILL — GLUCAGON 1 MG EMERGENCY KIT: 1 | 1 days supply | Qty: 1 | Fill #1

## 2016-11-10 MED FILL — DICLOFENAC SODIUM 1% GEL: 1 | 30 days supply | Qty: 100 | Fill #3

## 2016-11-10 MED FILL — DESOGESTR-ETH ESTRAD ETH ES: 0.15-0.02/0 | 84 days supply | Qty: 84 | Fill #2

## 2016-11-11 MED FILL — D-AMPHETAMINE ER 15 MG CAP: 15 | 30 days supply | Qty: 120 | Fill #0

## 2016-11-13 DIAGNOSIS — Z794 Long term (current) use of insulin: Secondary | ICD-10-CM | POA: Diagnosis not present

## 2016-11-13 DIAGNOSIS — R110 Nausea: Secondary | ICD-10-CM | POA: Diagnosis not present

## 2016-11-13 DIAGNOSIS — I10 Essential (primary) hypertension: Secondary | ICD-10-CM | POA: Diagnosis not present

## 2016-11-13 DIAGNOSIS — E103293 Type 1 diabetes mellitus with mild nonproliferative diabetic retinopathy without macular edema, bilateral: Secondary | ICD-10-CM | POA: Diagnosis not present

## 2016-11-13 DIAGNOSIS — E1065 Type 1 diabetes mellitus with hyperglycemia: Secondary | ICD-10-CM | POA: Diagnosis not present

## 2016-11-13 DIAGNOSIS — Z79899 Other long term (current) drug therapy: Secondary | ICD-10-CM | POA: Diagnosis not present

## 2016-11-13 DIAGNOSIS — E669 Obesity, unspecified: Secondary | ICD-10-CM | POA: Diagnosis not present

## 2016-11-13 DIAGNOSIS — F909 Attention-deficit hyperactivity disorder, unspecified type: Secondary | ICD-10-CM | POA: Diagnosis not present

## 2016-11-13 DIAGNOSIS — E785 Hyperlipidemia, unspecified: Secondary | ICD-10-CM | POA: Diagnosis not present

## 2016-11-16 DIAGNOSIS — I10 Essential (primary) hypertension: Secondary | ICD-10-CM | POA: Diagnosis not present

## 2016-11-16 DIAGNOSIS — Z6832 Body mass index (BMI) 32.0-32.9, adult: Secondary | ICD-10-CM | POA: Diagnosis not present

## 2016-11-16 DIAGNOSIS — F909 Attention-deficit hyperactivity disorder, unspecified type: Secondary | ICD-10-CM | POA: Diagnosis not present

## 2016-11-16 DIAGNOSIS — E109 Type 1 diabetes mellitus without complications: Secondary | ICD-10-CM | POA: Diagnosis not present

## 2016-11-16 DIAGNOSIS — R200 Anesthesia of skin: Secondary | ICD-10-CM | POA: Diagnosis not present

## 2016-11-16 DIAGNOSIS — R110 Nausea: Secondary | ICD-10-CM | POA: Diagnosis not present

## 2016-11-16 DIAGNOSIS — E785 Hyperlipidemia, unspecified: Secondary | ICD-10-CM | POA: Diagnosis not present

## 2016-11-16 DIAGNOSIS — E669 Obesity, unspecified: Secondary | ICD-10-CM | POA: Diagnosis not present

## 2016-11-16 DIAGNOSIS — Z23 Encounter for immunization: Secondary | ICD-10-CM | POA: Diagnosis not present

## 2016-11-16 DIAGNOSIS — Z794 Long term (current) use of insulin: Secondary | ICD-10-CM | POA: Diagnosis not present

## 2016-11-19 DIAGNOSIS — Z01419 Encounter for gynecological examination (general) (routine) without abnormal findings: Secondary | ICD-10-CM | POA: Diagnosis not present

## 2016-11-19 DIAGNOSIS — Z1151 Encounter for screening for human papillomavirus (HPV): Secondary | ICD-10-CM | POA: Diagnosis not present

## 2016-11-19 MED FILL — CONTOUR NEXT STRIPS: 38 days supply | Qty: 300 | Fill #5

## 2016-11-23 DIAGNOSIS — Z794 Long term (current) use of insulin: Secondary | ICD-10-CM | POA: Diagnosis not present

## 2016-11-23 DIAGNOSIS — E108 Type 1 diabetes mellitus with unspecified complications: Secondary | ICD-10-CM | POA: Diagnosis not present

## 2016-11-23 MED FILL — MELOXICAM 7.5 MG TABLET: 7.5 | 30 days supply | Qty: 60 | Fill #0

## 2016-11-24 DIAGNOSIS — I10 Essential (primary) hypertension: Secondary | ICD-10-CM | POA: Diagnosis not present

## 2016-11-24 DIAGNOSIS — F909 Attention-deficit hyperactivity disorder, unspecified type: Secondary | ICD-10-CM | POA: Diagnosis not present

## 2016-11-24 DIAGNOSIS — E1065 Type 1 diabetes mellitus with hyperglycemia: Secondary | ICD-10-CM | POA: Diagnosis not present

## 2016-11-24 DIAGNOSIS — E7800 Pure hypercholesterolemia, unspecified: Secondary | ICD-10-CM | POA: Diagnosis not present

## 2016-11-24 DIAGNOSIS — Z79899 Other long term (current) drug therapy: Secondary | ICD-10-CM | POA: Diagnosis not present

## 2016-11-24 DIAGNOSIS — Z794 Long term (current) use of insulin: Secondary | ICD-10-CM | POA: Diagnosis not present

## 2016-11-24 DIAGNOSIS — Z9641 Presence of insulin pump (external) (internal): Secondary | ICD-10-CM | POA: Diagnosis not present

## 2016-11-24 MED FILL — ZOLPIDEM TARTRATE 5 MG TABL: 5 | 20 days supply | Qty: 20 | Fill #0

## 2016-11-24 MED FILL — ROSUVASTATIN CALCIUM 5 MG T: 5 | 90 days supply | Qty: 90 | Fill #0

## 2016-11-27 MED FILL — HUMALOG 100 UNITS/ML KWIKPE: 100 | 31 days supply | Qty: 48 | Fill #4

## 2016-11-27 MED FILL — MOMETASONE FUROATE 50 MCG S: 50 | 30 days supply | Qty: 17 | Fill #4

## 2016-11-30 DIAGNOSIS — R1032 Left lower quadrant pain: Secondary | ICD-10-CM | POA: Diagnosis not present

## 2016-11-30 MED FILL — TRETINOIN GEL MICRO 0.1% TU: 0.1 | 30 days supply | Qty: 45 | Fill #0

## 2016-12-04 MED FILL — DICLOFENAC SODIUM 1% GEL: 1 | 30 days supply | Qty: 100 | Fill #0

## 2016-12-04 MED FILL — IPRATROPIUM 0.03% SPRAY: 0.03 | 30 days supply | Qty: 30 | Fill #4

## 2016-12-11 MED FILL — D-AMPHETAMINE ER 15 MG CAP: 15 | 30 days supply | Qty: 120 | Fill #0

## 2016-12-22 DIAGNOSIS — E109 Type 1 diabetes mellitus without complications: Secondary | ICD-10-CM | POA: Diagnosis not present

## 2016-12-22 DIAGNOSIS — M216X1 Other acquired deformities of right foot: Secondary | ICD-10-CM | POA: Diagnosis not present

## 2016-12-22 DIAGNOSIS — M722 Plantar fascial fibromatosis: Secondary | ICD-10-CM | POA: Diagnosis not present

## 2016-12-22 MED FILL — HUMALOG 100 UNITS/ML KWIKPE: 100 | 31 days supply | Qty: 48 | Fill #5

## 2016-12-22 MED FILL — MELOXICAM 7.5 MG TABLET: 7.5 | 30 days supply | Qty: 60 | Fill #1

## 2016-12-22 MED FILL — CONTOUR NEXT STRIPS: 38 days supply | Qty: 300 | Fill #6

## 2016-12-22 MED FILL — MOMETASONE FUROATE 50 MCG S: 50 | 30 days supply | Qty: 17 | Fill #5

## 2016-12-22 MED FILL — ZOLPIDEM TARTRATE 5 MG TABL: 5 | 20 days supply | Qty: 20 | Fill #0

## 2016-12-24 MED FILL — TRETINOIN GEL MICRO 0.1% TU: 0.1 | 30 days supply | Qty: 45 | Fill #1

## 2016-12-28 MED FILL — DICLOFENAC SODIUM 1% GEL: 1 | 30 days supply | Qty: 100 | Fill #1

## 2016-12-28 MED FILL — IPRATROPIUM 0.03% SPRAY: 0.03 | 30 days supply | Qty: 30 | Fill #5

## 2016-12-30 DIAGNOSIS — M654 Radial styloid tenosynovitis [de Quervain]: Secondary | ICD-10-CM | POA: Diagnosis not present

## 2016-12-30 DIAGNOSIS — M25641 Stiffness of right hand, not elsewhere classified: Secondary | ICD-10-CM | POA: Diagnosis not present

## 2016-12-30 DIAGNOSIS — G5601 Carpal tunnel syndrome, right upper limb: Secondary | ICD-10-CM | POA: Diagnosis not present

## 2017-01-04 DIAGNOSIS — Z794 Long term (current) use of insulin: Secondary | ICD-10-CM | POA: Diagnosis not present

## 2017-01-04 DIAGNOSIS — E108 Type 1 diabetes mellitus with unspecified complications: Secondary | ICD-10-CM | POA: Diagnosis not present

## 2017-01-04 MED FILL — ENALAPRIL MALEATE 10 MG TAB: 10 | 90 days supply | Qty: 90 | Fill #0 | Status: TO

## 2017-01-06 DIAGNOSIS — Z794 Long term (current) use of insulin: Secondary | ICD-10-CM | POA: Diagnosis not present

## 2017-01-06 DIAGNOSIS — E108 Type 1 diabetes mellitus with unspecified complications: Secondary | ICD-10-CM | POA: Diagnosis not present

## 2017-01-08 MED FILL — D-AMPHETAMINE ER 15 MG CAP: 15 | 30 days supply | Qty: 120 | Fill #0

## 2017-01-12 ENCOUNTER — Other Ambulatory Visit: Payer: Self-pay

## 2017-01-12 NOTE — Patient Outreach (Signed)
[facility]) Care Management  01/12/2017  Frazier ButtStephanie Egnor 13-Apr-1985 [id number]   Member called for follow up visit for Link to Wellness program for self management of Type 1 diabetes and to close case. No answer and message left.  If call not returned today plan to attempt outreach tomorrow 01/13/17. Dudley MajorMelissa Deltha Bernales RN, [facility] Inc[facility],CCM Care Management Coordinator-Link to Wellness [facility] Care Management [phone]

## 2017-01-13 ENCOUNTER — Other Ambulatory Visit: Payer: Self-pay

## 2017-01-13 ENCOUNTER — Ambulatory Visit: Payer: Self-pay

## 2017-01-13 DIAGNOSIS — R110 Nausea: Secondary | ICD-10-CM | POA: Diagnosis not present

## 2017-01-13 NOTE — Patient Outreach (Signed)
[facility]) Care Management  01/13/2017  Valerie ButtStephanie Gilberti 11/04/1985 [id number]   Telephone call to inform of closing of Link to Wellness case and transition to [employer].  Message left to Instruct that she will be transitioned to [employer] in 2019 for disease management and the Link to Wellness program will be closed. Instructed that she will be contacted by [employer] by phone in January 2019.   Instructed that she will continue to receive the pharmacy benefit.    [employer] will contact member in January to continue diabetes disease management. Case closed for Link to Wellness as member will be enrolled in an external program. Member and provider to be sent letter on transition to [employer] Dudley MajorMelissa Elva Mauro RN, [facility],CCM Care Management Coordinator-Link to Wellness [facility] Care Management [phone]

## 2017-01-14 DIAGNOSIS — J301 Allergic rhinitis due to pollen: Secondary | ICD-10-CM | POA: Diagnosis not present

## 2017-01-14 DIAGNOSIS — J310 Chronic rhinitis: Secondary | ICD-10-CM | POA: Diagnosis not present

## 2017-01-14 DIAGNOSIS — R0981 Nasal congestion: Secondary | ICD-10-CM | POA: Diagnosis not present

## 2017-01-14 DIAGNOSIS — J343 Hypertrophy of nasal turbinates: Secondary | ICD-10-CM | POA: Diagnosis not present

## 2017-01-14 DIAGNOSIS — H9202 Otalgia, left ear: Secondary | ICD-10-CM | POA: Diagnosis not present

## 2017-01-14 DIAGNOSIS — J3489 Other specified disorders of nose and nasal sinuses: Secondary | ICD-10-CM | POA: Diagnosis not present

## 2017-01-14 DIAGNOSIS — H6982 Other specified disorders of Eustachian tube, left ear: Secondary | ICD-10-CM | POA: Diagnosis not present

## 2017-01-14 MED FILL — AZELASTINE HCL 137 MCG SPRY: 0.1 | 30 days supply | Qty: 30 | Fill #0

## 2017-01-15 MED FILL — BD PEN NDL NANO 32GX5/32: 32G X 4 MM | 30 days supply | Qty: 300 | Fill #1

## 2017-01-15 MED FILL — BD PEN NDL NANO 32GX5/32": 32G X 4 MM | 30 days supply | Qty: 300 | Fill #1

## 2017-01-15 MED FILL — HUMALOG 100 UNITS/ML KWIKPE: 100 | 31 days supply | Qty: 48 | Fill #6

## 2017-01-15 MED FILL — MOMETASONE FUROATE 50 MCG S: 50 | 30 days supply | Qty: 17 | Fill #6

## 2017-01-18 MED FILL — RABEPRAZOLE SOD DR 20 MG TA: 20 | 30 days supply | Qty: 60 | Fill #0

## 2017-01-18 MED FILL — TRETINOIN GEL MICRO 0.1% TU: 0.1 | 30 days supply | Qty: 45 | Fill #2

## 2017-01-20 DIAGNOSIS — E108 Type 1 diabetes mellitus with unspecified complications: Secondary | ICD-10-CM | POA: Diagnosis not present

## 2017-01-26 ENCOUNTER — Emergency Department (HOSPITAL_BASED_OUTPATIENT_CLINIC_OR_DEPARTMENT_OTHER)
Admission: EM | Admit: 2017-01-26 | Discharge: 2017-01-27 | Disposition: A | Discharge status: Home / Self Care | Payer: 59 | Attending: Emergency Medicine | Admitting: Emergency Medicine

## 2017-01-26 ENCOUNTER — Encounter (HOSPITAL_BASED_OUTPATIENT_CLINIC_OR_DEPARTMENT_OTHER): Payer: Self-pay | Admitting: *Deleted

## 2017-01-26 ENCOUNTER — Other Ambulatory Visit: Payer: Self-pay

## 2017-01-26 DIAGNOSIS — B028 Zoster with other complications: Secondary | ICD-10-CM | POA: Diagnosis not present

## 2017-01-26 DIAGNOSIS — E109 Type 1 diabetes mellitus without complications: Secondary | ICD-10-CM | POA: Diagnosis not present

## 2017-01-26 DIAGNOSIS — Z79899 Other long term (current) drug therapy: Secondary | ICD-10-CM | POA: Insufficient documentation

## 2017-01-26 DIAGNOSIS — Z794 Long term (current) use of insulin: Secondary | ICD-10-CM | POA: Insufficient documentation

## 2017-01-26 DIAGNOSIS — R21 Rash and other nonspecific skin eruption: Secondary | ICD-10-CM | POA: Diagnosis present

## 2017-01-26 DIAGNOSIS — B029 Zoster without complications: Secondary | ICD-10-CM | POA: Diagnosis not present

## 2017-01-26 DIAGNOSIS — I10 Essential (primary) hypertension: Secondary | ICD-10-CM | POA: Insufficient documentation

## 2017-01-26 MED ORDER — TRAMADOL HCL 50 MG PO TABS: 50.0000 mg | ORAL_TABLET | Freq: Four times a day (QID) | ORAL | 0 refills | Status: DC | PRN

## 2017-01-26 MED ORDER — MELOXICAM 15 MG PO TABS: 15.0000 mg | ORAL_TABLET | Freq: Every day | ORAL | 0 refills | Status: DC

## 2017-01-26 MED ORDER — VALACYCLOVIR HCL 1 G PO TABS: 1000.0000 mg | ORAL_TABLET | Freq: Three times a day (TID) | ORAL | 0 refills | Status: AC

## 2017-01-26 MED ORDER — VALACYCLOVIR HCL 500 MG PO TABS
1000.0000 mg | ORAL_TABLET | Freq: Three times a day (TID) | ORAL | Status: DC
  Administered 2017-01-27: 1000 mg via ORAL
  Filled 2017-01-26: qty 2

## 2017-01-26 NOTE — ED Triage Notes (Signed)
She has a rash x 4 days. Looks like shingles.

## 2017-01-26 NOTE — ED Provider Notes (Signed)
[facility] EMERGENCY DEPARTMENT Provider Note   CSN: [id number] Arrival date & time: 01/26/17  2053     History   Chief Complaint Chief Complaint  Patient presents with  . Rash    HPI Valerie Dennis is a 31 y.o. female.  The history is provided by the patient and the spouse.  Rash   This is a new problem. The current episode started yesterday. The problem has not changed since onset.The problem is associated with nothing. There has been no fever. The rash is present on the torso and back. The pain is at a severity of 8/10. The pain is severe. The pain has been constant since onset. Associated symptoms include blisters, itching and pain. She has tried nothing for the symptoms. The treatment provided no relief. Risk factors: none.    Past Medical History:  Diagnosis Date  . Diabetes mellitus without complication (HCC)   . Hypertension     Patient Active Problem List   Diagnosis Date Noted  . History of severe pre-eclampsia 06/08/2016  . Status post primary low transverse cesarean section 04/28/2016  . ADHD (attention deficit hyperactivity disorder) 03/04/2016  . 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 Hypertension 02/18/2015  . HLD (hyperlipidemia) 03/30/2014  . Type 1 diabetes mellitus (HCC) 03/14/2014  . Hypertrophy of nasal turbinates 05/16/2013    Past Surgical History:  Procedure Laterality Date  . CAUTERY OF TURBINATES  2003  . CESAREAN SECTION N/A 04/24/2016   Procedure: CESAREAN SECTION;  Surgeon: Reva Boresanya S Pratt, MD;  [facility];  Service: Obstetrics;  Laterality: N/A;  . DE QUERVAIN'S RELEASE  2016  . WISDOM TOOTH EXTRACTION      OB History    Gravida Para Term Preterm AB Living   1 1 1     1    SAB TAB Ectopic Multiple Live Births         0 1       Home Medications    Prior to Admission medications   Medication Sig Start Date End Date  Taking? Authorizing Provider  enalapril (VASOTEC) 10 MG tablet Take 10 mg by mouth daily. 06/29/16  Yes [provider]  Insulin Human (INSULIN PUMP) SOLN Inject 1 each into the skin continuous. Humalog insulin in pump, up to 150 units per day   Yes [provider]  insulin lispro (HUMALOG) 100 UNIT/ML KiwkPen as needed 02/28/16  Yes [provider]  BAYER CONTOUR NEXT TEST test strip  03/09/16   [provider]  desogestrel-ethinyl estradiol (KARIVA) 0.15-0.02/0.01 MG (21/5) tablet Take 1 tablet by mouth daily. 06/08/16   Lesly DukesLeggett, Kelly H, MD  dextroamphetamine (DEXEDRINE SPANSULE) 15 MG 24 hr capsule Take 30 mg by mouth 2 (two) times daily. 08/19/16   [provider]  Prenatal Vit-Fe Fumarate-FA (PRENATAL MULTIVITAMIN) TABS tablet Take 1 tablet by mouth daily at 12 noon. Patient not taking: Reported on 09/23/2016 04/27/16   Montez MoritaLawson, Marie D, CNM  zolpidem (AMBIEN) 5 MG tablet Take 2 tablets (10 mg total) by mouth at bedtime as needed for sleep. 06/08/16 07/08/16  Lesly DukesLeggett, Kelly H, MD    Family History Family History  Problem Relation Age of Onset  . Hyperlipidemia Mother   . Rheum arthritis Mother   . Heart disease Father   . Hyperlipidemia Father   . Hypertension Father   . Stroke Father   . Hypertension Sister   . Heart disease Maternal  Grandmother   . Heart disease Maternal Grandfather   . Heart disease Paternal Grandfather   . Hypertension Paternal Grandfather   . Diabetes Paternal Grandfather     Social History Social History   Tobacco Use  . Smoking status: Never Smoker  . Smokeless tobacco: Never Used  Substance Use Topics  . Alcohol use: Yes    Alcohol/week: 0.0 oz    Comment: Socially before pregnant  . Drug use: No     Allergies   Amoxicillin   Review of Systems Review of Systems  Constitutional: Negative for fever.  Eyes: Negative for photophobia, redness and visual disturbance.  Skin: Positive for itching and rash.    All other systems reviewed and are negative.    Physical Exam Updated Vital Signs BP (!) 172/111   Pulse 94   Temp 98.4 F (36.9 C) (Oral)   Resp 20   Ht 5\' 4"  (1.626 m)   Wt 77.1 kg (170 lb)   SpO2 100%   BMI 29.18 kg/m   Physical Exam  Constitutional: She is oriented to person, place, and time. She appears well-developed and well-nourished. No distress.  HENT:  Head: Normocephalic and atraumatic.  Mouth/Throat: No oropharyngeal exudate.  Eyes: Conjunctivae are normal. Pupils are equal, round, and reactive to light.  Neck: Normal range of motion. Neck supple.  Cardiovascular: Normal rate, regular rhythm, normal heart sounds and intact distal pulses.  Pulmonary/Chest: Effort normal and breath sounds normal. No stridor. She has no wheezes. She has no rales.  Abdominal: Soft. Bowel sounds are normal.  Musculoskeletal: Normal range of motion.  Neurological: She is alert and oriented to person, place, and time.  Skin: Skin is warm and dry. Rash noted.        ED Treatments / Results   Vitals:   01/26/17 2138  BP: (!) 172/111  Pulse: 94  Resp: 20  Temp: 98.4 F (36.9 C)  SpO2: 100%    Procedures (including critical care time)  Medications Ordered in ED  Medications - No data to display   Final Clinical Impressions(s) / ED Diagnoses   Follow up with your doctor this week for recheck of your shingles.  Return for fevers > 101,  global weakness, stiff neck, intractable vomiting, or diarrhea, abdominal pain, Inability to tolerate liquids or food, cough, altered mental status or any concerns. No signs of systemic illness or infection. The patient is nontoxic-appearing on exam and vital signs are within normal limits.    I have reviewed the triage vital signs and the nursing notes. Pertinent labs &imaging results that were available during my care of the patient were reviewed by me and considered in my medical decision making (see chart for details).  After  history, exam, and medical workup I feel the patient has been appropriately medically screened and is safe for discharge home. Pertinent diagnoses were discussed with the patient. Patient was given return precautions      Coryn Mosso, MD 01/27/17 [phone]

## 2017-01-27 ENCOUNTER — Encounter (HOSPITAL_BASED_OUTPATIENT_CLINIC_OR_DEPARTMENT_OTHER): Payer: Self-pay | Admitting: Emergency Medicine

## 2017-01-28 DIAGNOSIS — E1065 Type 1 diabetes mellitus with hyperglycemia: Secondary | ICD-10-CM | POA: Diagnosis not present

## 2017-01-28 MED FILL — DICLOFENAC SODIUM 1% GEL: 1 | 30 days supply | Qty: 100 | Fill #2

## 2017-01-28 MED FILL — ZOLPIDEM TARTRATE 5 MG TABL: 5 | 20 days supply | Qty: 20 | Fill #0

## 2017-01-28 MED FILL — IPRATROPIUM 0.03% SPRAY: 0.03 | 43 days supply | Qty: 30 | Fill #6

## 2017-01-28 MED FILL — CONTOUR NEXT STRIPS: 38 days supply | Qty: 300 | Fill #7

## 2017-01-28 MED FILL — DESOGESTR-ETH ESTRAD ETH ES: 0.15-0.02/0 | 84 days supply | Qty: 84 | Fill #3

## 2017-01-28 MED FILL — traMADol HCL 50 MG TABS: 50 | 5 days supply | Qty: 20 | Fill #0

## 2017-01-28 MED FILL — GLUCAGON 1 MG EMERGENCY KIT: 1 | 1 days supply | Qty: 1 | Fill #2

## 2017-01-29 DIAGNOSIS — B029 Zoster without complications: Secondary | ICD-10-CM | POA: Diagnosis not present

## 2017-01-29 DIAGNOSIS — I10 Essential (primary) hypertension: Secondary | ICD-10-CM | POA: Diagnosis not present

## 2017-01-29 DIAGNOSIS — E103393 Type 1 diabetes mellitus with moderate nonproliferative diabetic retinopathy without macular edema, bilateral: Secondary | ICD-10-CM | POA: Diagnosis not present

## 2017-02-01 DIAGNOSIS — E108 Type 1 diabetes mellitus with unspecified complications: Secondary | ICD-10-CM | POA: Diagnosis not present

## 2017-02-03 MED FILL — LANTUS SOLOSTAR 100 UNITS/M: 100 | 60 days supply | Qty: 30 | Fill #0

## 2017-02-04 DIAGNOSIS — R197 Diarrhea, unspecified: Secondary | ICD-10-CM | POA: Diagnosis not present

## 2017-02-04 DIAGNOSIS — R112 Nausea with vomiting, unspecified: Secondary | ICD-10-CM | POA: Diagnosis not present

## 2017-02-04 DIAGNOSIS — R110 Nausea: Secondary | ICD-10-CM | POA: Diagnosis not present

## 2017-02-05 DIAGNOSIS — Z794 Long term (current) use of insulin: Secondary | ICD-10-CM | POA: Diagnosis not present

## 2017-02-05 DIAGNOSIS — E108 Type 1 diabetes mellitus with unspecified complications: Secondary | ICD-10-CM | POA: Diagnosis not present

## 2017-02-08 DIAGNOSIS — Z794 Long term (current) use of insulin: Secondary | ICD-10-CM | POA: Diagnosis not present

## 2017-02-08 DIAGNOSIS — E108 Type 1 diabetes mellitus with unspecified complications: Secondary | ICD-10-CM | POA: Diagnosis not present

## 2017-02-08 MED FILL — HUMALOG 100 UNITS/ML KWIKPE: 100 | 31 days supply | Qty: 48 | Fill #7

## 2017-02-08 MED FILL — D-AMPHETAMINE ER 15 MG CAP: 15 | 30 days supply | Qty: 120 | Fill #0

## 2017-02-08 MED FILL — AZELASTINE HCL 137 MCG SPRY: 0.1 | 30 days supply | Qty: 30 | Fill #1

## 2017-02-08 MED FILL — traMADol HCL 50 MG TABS: 50 | 3 days supply | Qty: 20 | Fill #0

## 2017-02-08 MED FILL — GABAPENTIN 300 MG CAPSULE: 300 | 30 days supply | Qty: 90 | Fill #0

## 2017-02-08 MED FILL — MOMETASONE FUROATE 50 MCG S: 50 | 30 days supply | Qty: 17 | Fill #0

## 2017-03-06 ENCOUNTER — Other Ambulatory Visit: Payer: Self-pay

## 2017-03-06 ENCOUNTER — Emergency Department (INDEPENDENT_AMBULATORY_CARE_PROVIDER_SITE_OTHER)
Admission: EM | Admit: 2017-03-06 | Discharge: 2017-03-06 | Disposition: A | Discharge status: Home / Self Care | Payer: Federal, State, Local not specified - PPO | Source: Home / Self Care | Attending: Family Medicine | Admitting: Family Medicine

## 2017-03-06 ENCOUNTER — Encounter: Payer: Self-pay | Admitting: *Deleted

## 2017-03-06 DIAGNOSIS — R0789 Other chest pain: Secondary | ICD-10-CM | POA: Diagnosis not present

## 2017-03-06 DIAGNOSIS — I4510 Unspecified right bundle-branch block: Secondary | ICD-10-CM | POA: Diagnosis not present

## 2017-03-06 DIAGNOSIS — R9431 Abnormal electrocardiogram [ECG] [EKG]: Secondary | ICD-10-CM | POA: Diagnosis not present

## 2017-03-06 MED ORDER — GI COCKTAIL ~~LOC~~
30.0000 mL | Freq: Once | ORAL | Status: AC
Start: 2017-03-06 — End: 2017-03-06
  Administered 2017-03-06: 30 mL via ORAL

## 2017-03-06 MED ORDER — ASPIRIN 325 MG PO TABS: 325.0000 mg | ORAL_TABLET | Freq: Once | ORAL | Status: DC

## 2017-03-06 NOTE — ED Triage Notes (Signed)
Pt c/o "heartburn" x 2 days, worse today. She reports a burning sensation in the center of her chest q 3-4 minutes lasting 10-15 seconds. She reports shingles in 12/18'. She took Tums and acid reducer today without relief.

## 2017-03-06 NOTE — ED Provider Notes (Signed)
[facility]    CSN: [id number] Arrival date & time: 03/06/17  1710     History   Chief Complaint Chief Complaint  Patient presents with  . Heartburn    HPI Valerie Dennis is a 32 y.o. female.   HPI  Valerie Dennis is a 32 y.o. female presenting to UC with c/o "heartburn" for about 2 days.  Pain is worse and more frequent today.  Pain is burning in the center of her chest and just slightly to the left. Pain is 3/10 in severity, lasts 10-15 seconds and comes on every 3-4 minutes all day.  Does not radiate to the back, jaw or arms. She took Tums and an OTC PPI w/o relief. She reports having acid reflux during her 3rd trimester with her daughter a few years ago but has not had heartburn since then.  She also reports having shingles on Left side of chest dx on 01/26/17.  She questions if this pain is from that, but does state it feels deeper inside her chest and feels "different."  Denies rashes or radiation of the pain where shingles rash was a few weeks ago.  Denies SOB but has had intermittent palpitations. Denies diaphoresis.  She has had nausea but reports chronic nausea at night.  She had an EGD performed on 11/13/16 due to the chronic nausea but stated it was "okay." no known pancreas problems but states her mother has gallbladder problems. She does have a hx of Type 1 DM and HTN as well as "strong family hx of heart problems" including both her grandparents dying of heart attacks in their 44s and her father having an MI before he turned 57.  She reports having a routine EKG for her high risk pregnancy (due to diabetes and HTN) and states it was normal.  She did have an EKG in 2015 including a "cardiac scan" listed in [facility] from [facility] on 06/30/13 but unable to pull up any EKG from that encounter.  She had a normal CXR at that time.  That workup was due to Left lower chest pain.  She does report having "borderling abnormal" cardiac enzymes and was advised to  go to the hospital at that time but states her in-laws were in town so she decided not to go.  She does f/u with her PCP routinely but she has never had a formal evaluation by a cardiologist.     Past Medical History:  Diagnosis Date  . Diabetes mellitus without complication (HCC)   . Hypertension     Patient Active Problem List   Diagnosis Date Noted  . History of severe pre-eclampsia 06/08/2016  . Status post primary low transverse cesarean section 04/28/2016  . ADHD (attention deficit hyperactivity disorder) 03/04/2016  . 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 Hypertension 02/18/2015  . HLD (hyperlipidemia) 03/30/2014  . Type 1 diabetes mellitus (HCC) 03/14/2014  . Hypertrophy of nasal turbinates 05/16/2013    Past Surgical History:  Procedure Laterality Date  . CAUTERY OF TURBINATES  2003  . CESAREAN SECTION N/A 04/24/2016   Procedure: CESAREAN SECTION;  Surgeon: Reva Bores, MD;  Location: [facility] BIRTHING SUITES;  Service: Obstetrics;  Laterality: N/A;  . DE QUERVAIN'S RELEASE  2016  . WISDOM TOOTH EXTRACTION      OB History    Gravida Para Term Preterm AB Living   1 1 1      1  SAB TAB Ectopic Multiple Live Births         0 1       Home Medications    Prior to Admission medications   Medication Sig Start Date End Date Taking? Authorizing Provider  BAYER CONTOUR NEXT TEST test strip  03/09/16   [provider]  desogestrel-ethinyl estradiol (KARIVA) 0.15-0.02/0.01 MG (21/5) tablet Take 1 tablet by mouth daily. 06/08/16   Lesly Dukes, MD  dextroamphetamine (DEXEDRINE SPANSULE) 15 MG 24 hr capsule Take 30 mg by mouth 2 (two) times daily. 08/19/16   [provider]  enalapril (VASOTEC) 10 MG tablet Take 10 mg by mouth daily. 06/29/16   [provider]  Insulin Human (INSULIN PUMP) SOLN Inject 1 each into the skin continuous. Humalog insulin in pump, up to 150 units  per day    [provider]  insulin lispro (HUMALOG) 100 UNIT/ML KiwkPen as needed 02/28/16   [provider]  meloxicam (MOBIC) 15 MG tablet Take 1 tablet (15 mg total) by mouth daily. 01/26/17   Palumbo, April, MD  Prenatal Vit-Fe Fumarate-FA (PRENATAL MULTIVITAMIN) TABS tablet Take 1 tablet by mouth daily at 12 noon. Patient not taking: Reported on 09/23/2016 04/27/16   Montez Morita, CNM  traMADol (ULTRAM) 50 MG tablet Take 1 tablet (50 mg total) by mouth every 6 (six) hours as needed. 01/26/17   Palumbo, April, MD  zolpidem (AMBIEN) 5 MG tablet Take 2 tablets (10 mg total) by mouth at bedtime as needed for sleep. 06/08/16 07/08/16  Lesly Dukes, MD    Family History Family History  Problem Relation Age of Onset  . Hyperlipidemia Mother   . Rheum arthritis Mother   . Heart disease Father   . Hyperlipidemia Father   . Hypertension Father   . Stroke Father   . Hypertension Sister   . Heart disease Maternal Grandmother   . Heart disease Maternal Grandfather   . Heart disease Paternal Grandfather   . Hypertension Paternal Grandfather   . Diabetes Paternal Grandfather     Social History Social History   Tobacco Use  . Smoking status: Never Smoker  . Smokeless tobacco: Never Used  Substance Use Topics  . Alcohol use: Yes    Alcohol/week: 0.0 oz    Comment: Socially before pregnant  . Drug use: No     Allergies   Amoxicillin   Review of Systems Review of Systems  Constitutional: Negative for chills, diaphoresis, fatigue and fever.  Respiratory: Negative for cough, chest tightness and shortness of breath.   Cardiovascular: Positive for chest pain and palpitations.  Gastrointestinal: Positive for nausea. Negative for abdominal pain, diarrhea and vomiting.  Musculoskeletal: Negative for back pain and myalgias.     Physical Exam Triage Vital Signs ED Triage Vitals  Enc Vitals Group     BP 03/06/17 1718 129/86     Pulse Rate 03/06/17 1718 99      Resp 03/06/17 1718 16     Temp 03/06/17 1718 98.1 F (36.7 C)     Temp Source 03/06/17 1718 Oral     SpO2 03/06/17 1718 99 %     Weight 03/06/17 1719 188 lb (85.3 kg)     Height 03/06/17 1719 5\' 4"  (1.626 m)     Head Circumference --      Peak Flow --      Pain Score 03/06/17 1719 6     Pain Loc --      Pain Edu? --  Excl. in GC? --    No data found.  Updated Vital Signs BP 129/86 (BP Location: Right Arm)   Pulse 99   Temp 98.1 F (36.7 C) (Oral)   Resp 16   Ht 5\' 4"  (1.626 m)   Wt 188 lb (85.3 kg)   SpO2 99%   BMI 32.27 kg/m   Visual Acuity Right Eye Distance:   Left Eye Distance:   Bilateral Distance:    Right Eye Near:   Left Eye Near:    Bilateral Near:     Physical Exam  Constitutional: She is oriented to person, place, and time. She appears well-developed and well-nourished. No distress.  HENT:  Head: Normocephalic and atraumatic.  Mouth/Throat: Uvula is midline, oropharynx is clear and moist and mucous membranes are normal.  Eyes: EOM are normal.  Neck: Normal range of motion. Neck supple.  Cardiovascular: Normal rate and regular rhythm.  Pulmonary/Chest: Effort normal and breath sounds normal. No stridor. No respiratory distress. She has no wheezes. She has no rales. She exhibits no tenderness.  Abdominal: Soft. She exhibits no distension and no mass. There is no tenderness. There is no rebound and no guarding.  Musculoskeletal: Normal range of motion.  Lymphadenopathy:    She has no cervical adenopathy.  Neurological: She is alert and oriented to person, place, and time.  Skin: Skin is warm and dry. She is not diaphoretic.  Psychiatric: She has a normal mood and affect. Her behavior is normal.  Nursing note and vitals reviewed.    UC Treatments / Results  Labs (all labs ordered are listed, but only abnormal results are displayed) Labs Reviewed - No data to display  EKG  EKG Interpretation Date/Time:03/06/2017   17:36:15 Ventricular Rate:  85 PR Interval: 120 QRS Duration: 94 QT Interval: 358 QTC Calculation: 426 P-R-T axes:  32  74   49 Text Interpretation: Normal sinus rhythm, incomplete Right bundle branch block. Borderline EKG Unable to pull up prior EKGs however, per pt, Right BBB is new.        Radiology No results found.  Procedures Procedures (including critical care time)  Medications Ordered in UC Medications  aspirin tablet 325 mg (not administered)  gi cocktail (Maalox,Lidocaine,Donnatal) (30 mLs Oral Given 03/06/17 1742)     Initial Impression / Assessment and Plan / UC Course  I have reviewed the triage vital signs and the nursing notes.  Pertinent labs & imaging results that were available during my care of the patient were reviewed by me and considered in my medical decision making (see chart for details).      Consulted with Dr. Cathren HarshBeese, agrees pt should go to emergency department this evening for further evaluation given risk factors of DM, HTN, family hx as well as no relief with Tums, OTC antiacid medication and GI cocktail given in UC. New findings of incomplete RRR on EKG per pt and "new" type of pain compared to prior shingles or acid reflux pain in the past.   Unable to perform stat troponin this evening  Pt [facility]. Pt in stable condition.   Final Clinical Impressions(s) / UC Diagnoses   Final diagnoses:  Atypical chest pain  Abnormal EKG  Right bundle branch block    ED Discharge Orders    None       Controlled Substance Prescriptions [place] Controlled Substance Registry consulted? Not Applicable   Rolla Platehelps, Cabrini Ruggieri O, PA-C 03/06/17 1951

## 2017-03-07 ENCOUNTER — Telehealth: Payer: Self-pay

## 2017-03-07 NOTE — Telephone Encounter (Signed)
Left message on VM to call UC if any questions or concerns.  Contact information given. 

## 2017-03-10 MED FILL — ONE TOUCH ULTRA TEST STRIPS: 30 days supply | Qty: 300 | Fill #0

## 2017-04-12 MED FILL — ONE TOUCH ULTRA TEST STRIPS: 30 days supply | Qty: 300 | Fill #1

## 2017-09-20 MED FILL — ONE TOUCH ULTRA TEST STRIPS: 30 days supply | Qty: 300 | Fill #2

## 2018-01-07 MED FILL — ONE TOUCH ULTRA TEST STRIPS: 30 days supply | Qty: 300 | Fill #3

## 2018-02-08 IMAGING — US US MFM FETAL BPP W/O NON-STRESS
1 series · 14 of 26 positions shown · non-contrast
Comparison: none

[Series 1: us mfm fetal bpp w/o non-stress · 26 acquisitions, 14 frames shown]
[im 1/26]
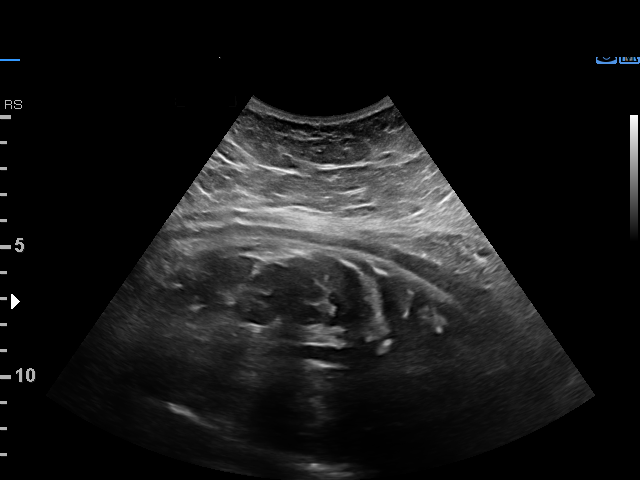
[im 3/26]
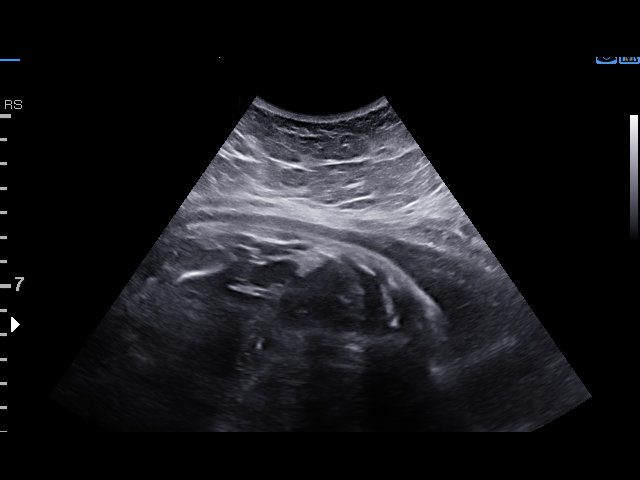
[im 5/26]
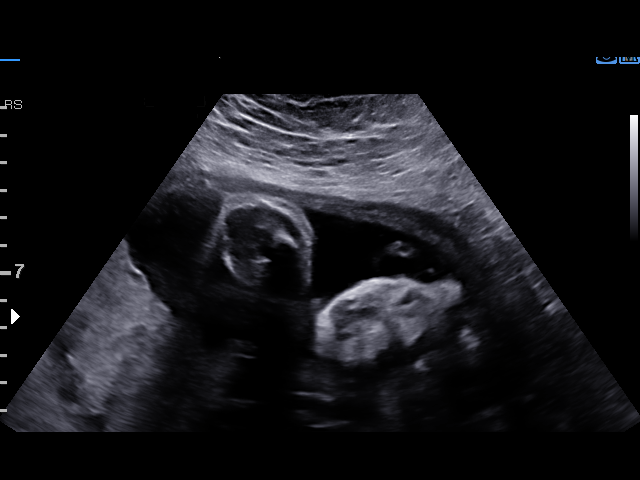
[im 7/26]
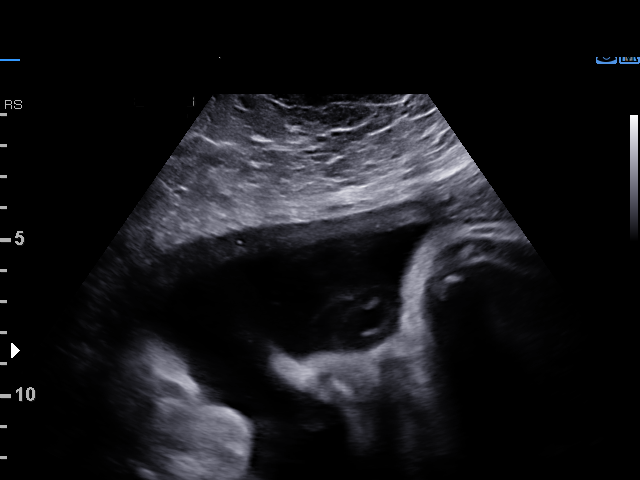
[im 9/26]
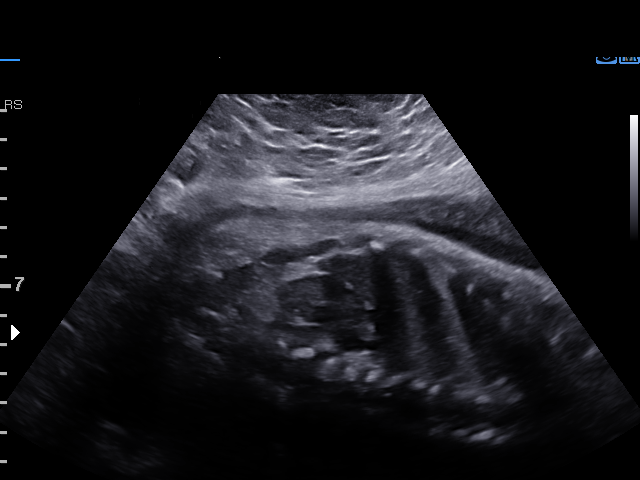
[im 11/26]
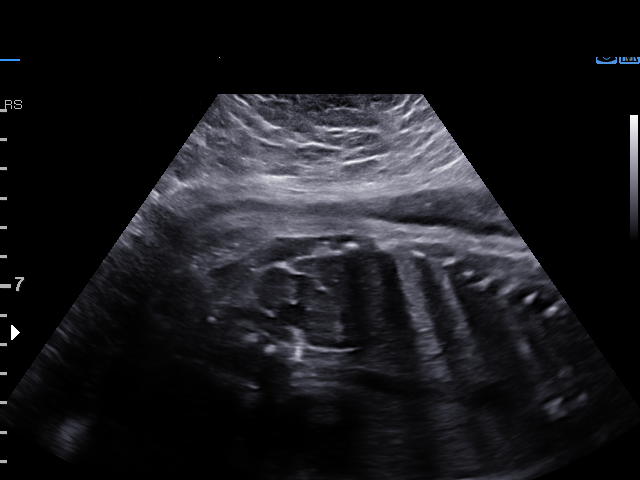
[im 13/26]
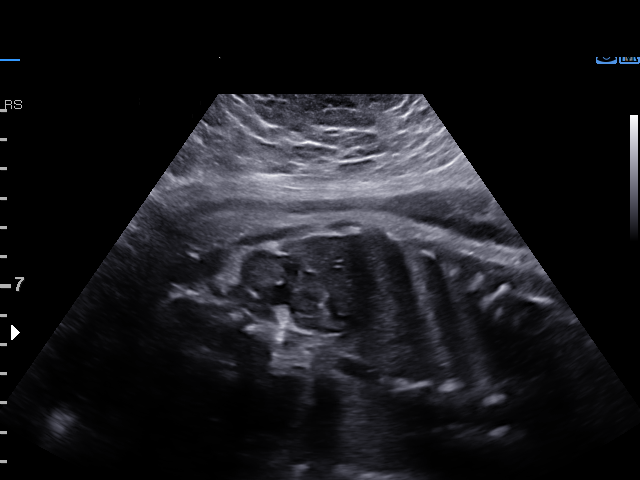
[im 14/26]
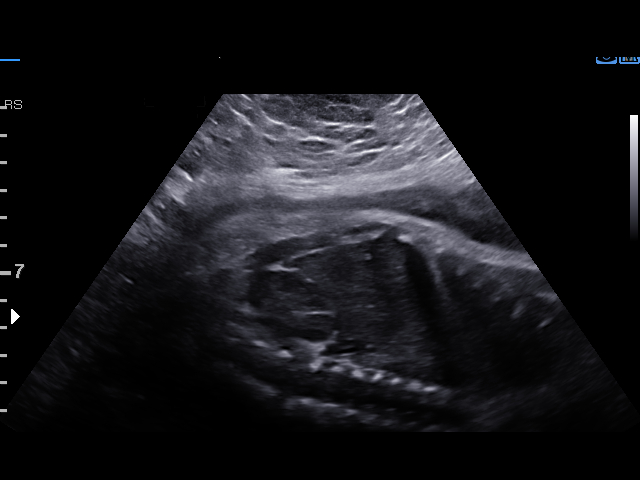
[im 16/26]
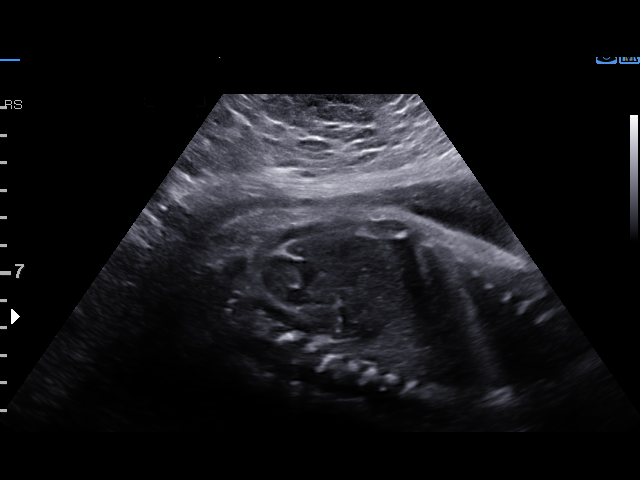
[im 18/26]
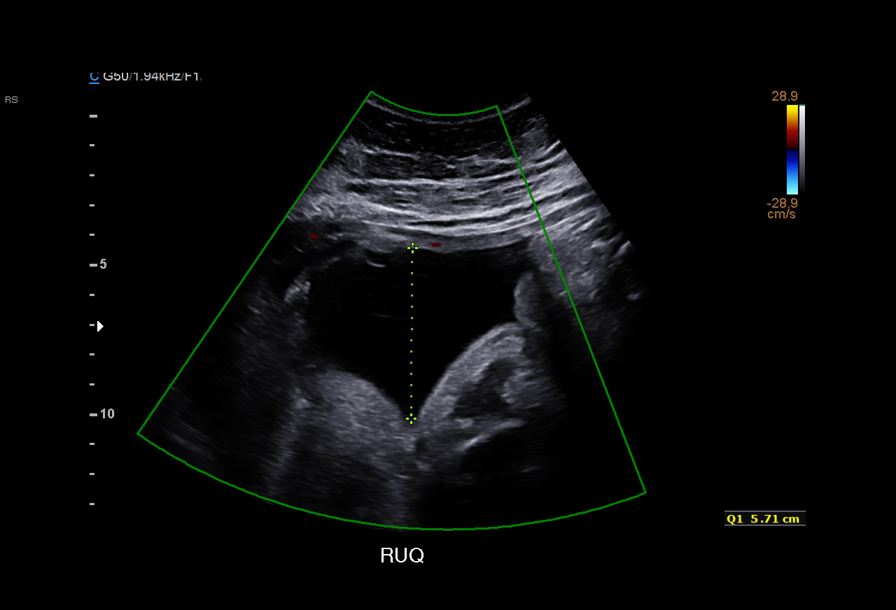
[im 20/26]
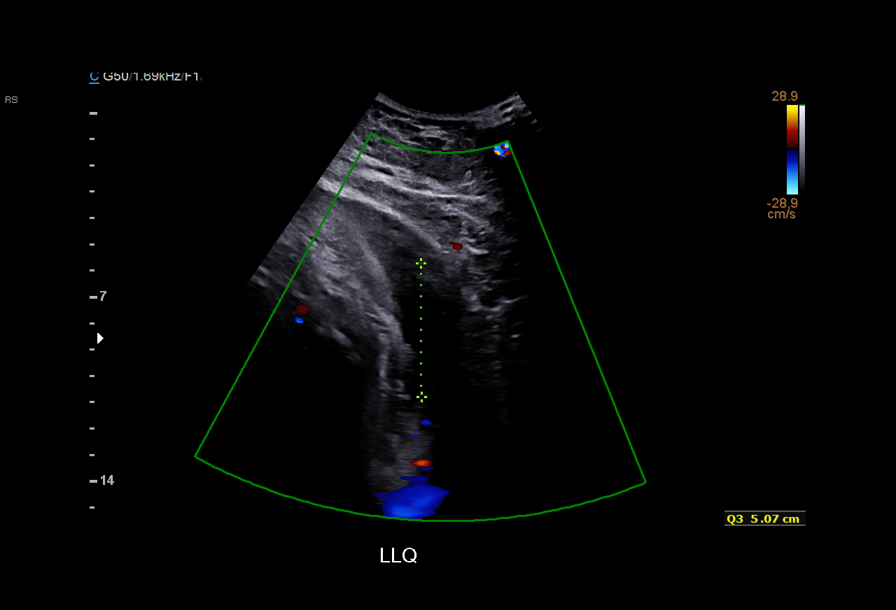
[im 22/26]
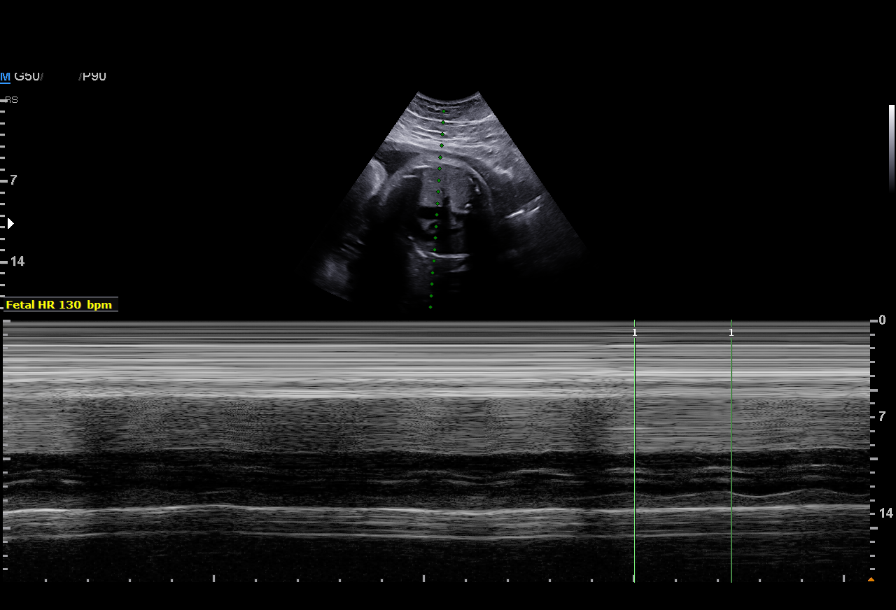
[im 24/26]
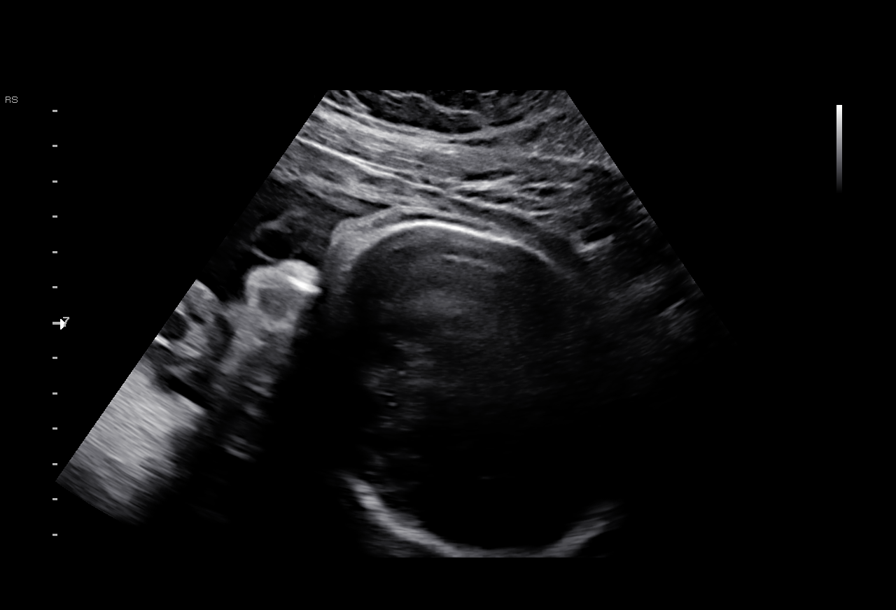
[im 26/26]
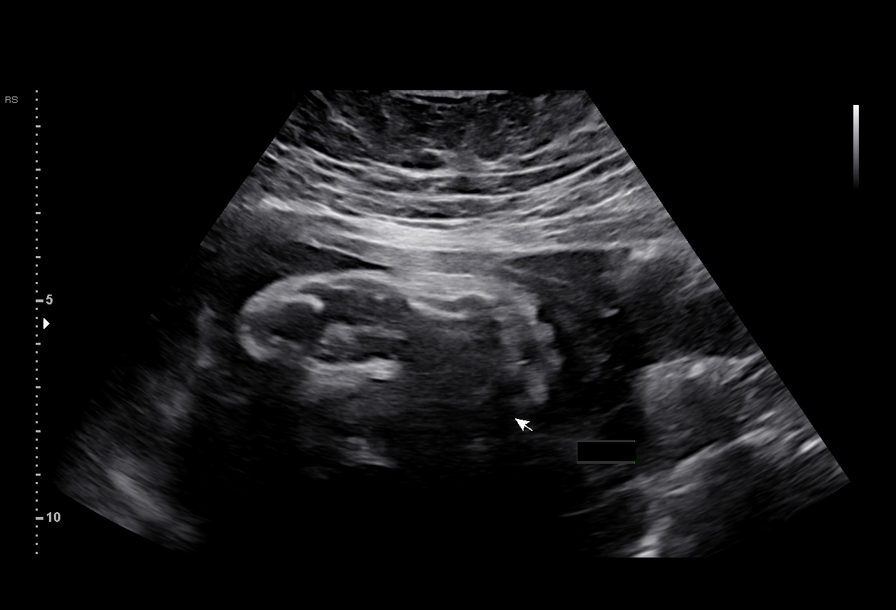

[14 of 26 positions shown; findings below may reference images not displayed]

RAUL OLEAD

1  GUI LANGE            [id number]      [id number]     [id number]
Indications

34 weeks gestation of pregnancy
Pre-existing diabetes, type 1, in pregnancy,
second trimester (insulin pump); normal
ECHO
Hypertension - Chronic/Pre-existing -no
meds
OB History

Gravidity:    1         Term:   0        Prem:   0        SAB:   0
TOP:          0       Ectopic:  0        Living: 0
Fetal Evaluation

Num Of Fetuses:     1
Fetal Heart         130
Rate(bpm):
Cardiac Activity:   Observed
Presentation:       Cephalic

Amniotic Fluid
AFI FV:      Subjectively within normal limits

AFI Sum(cm)     %Tile       Largest Pocket(cm)
18.54           69

RUQ(cm)       RLQ(cm)       LUQ(cm)        LLQ(cm)
5.71
Biophysical Evaluation

Amniotic F.V:   Pocket => 2 cm two         F. Tone:        Observed
planes
F. Movement:    Observed                   Score:          [DATE]
F. Breathing:   Observed
Gestational Age

LMP:           34w 5d        Date:  07/26/15                 EDD:   05/01/16
Best:          34w 5d     Det. By:  LMP  (07/26/15)          EDD:   05/01/16
Impression

IUP at 34+5 weeks,type 1 DM and CHTN, here for BPP
Normal amniotic fluid levels
BPP [DATE]
Recommendations

Continue weekly BPPs

## 2018-04-20 DIAGNOSIS — H40023 Open angle with borderline findings, high risk, bilateral: Secondary | ICD-10-CM | POA: Insufficient documentation

## 2018-04-20 DIAGNOSIS — H3582 Retinal ischemia: Secondary | ICD-10-CM | POA: Insufficient documentation

## 2018-07-14 ENCOUNTER — Other Ambulatory Visit: Payer: Self-pay

## 2018-07-14 ENCOUNTER — Ambulatory Visit: Payer: Federal, State, Local not specified - PPO | Admitting: Podiatry

## 2018-07-14 ENCOUNTER — Encounter: Payer: Self-pay | Admitting: Podiatry

## 2018-07-14 ENCOUNTER — Ambulatory Visit (INDEPENDENT_AMBULATORY_CARE_PROVIDER_SITE_OTHER): Payer: Federal, State, Local not specified - PPO

## 2018-07-14 VITALS — Temp 98.1°F | Resp 16

## 2018-07-14 DIAGNOSIS — M779 Enthesopathy, unspecified: Secondary | ICD-10-CM | POA: Diagnosis not present

## 2018-07-14 DIAGNOSIS — M2041 Other hammer toe(s) (acquired), right foot: Secondary | ICD-10-CM

## 2018-07-14 DIAGNOSIS — M205X1 Other deformities of toe(s) (acquired), right foot: Secondary | ICD-10-CM

## 2018-07-14 DIAGNOSIS — M79671 Pain in right foot: Secondary | ICD-10-CM

## 2018-07-14 NOTE — Patient Instructions (Signed)
1. Youngswick bunionectomy  2. Arthroplasty 2nd toe

## 2018-07-14 NOTE — Progress Notes (Signed)
Subjective:    Patient ID: Valerie Dennis, female    DOB: 1985-07-29, 33 y.o.   MRN: [id number]  HPI 33 year old female presents the office today for concerns of pain mostly to her right big toe.  She describes a deep, bone aching pain which is been ongoing since July 2019.  She states that she was previously seen by Dr. Delford Field.  Has been using Voltaren gel and meloxicam.  She states majority of pain is with walking and running.  No recent injury.  She states that she has difficulty when she bends her big toe on the right side.  She is also noticed her second digit becoming contracted.   Review of Systems  Musculoskeletal: Positive for arthralgias, joint swelling and myalgias.  All other systems reviewed and are negative.  Past Medical History:  Diagnosis Date  . Diabetes mellitus without complication (HCC)   . Hypertension     Past Surgical History:  Procedure Laterality Date  . CAUTERY OF TURBINATES  2003  . CESAREAN SECTION N/A 04/24/2016   Procedure: CESAREAN SECTION;  Surgeon: Reva Bores, MD;  Location: [facility] BIRTHING SUIT[facility];  Service: Obstetrics;  Laterality: N/A;  . DE QUERVAIN'S RELEASE  2016  . WISDOM TOOTH EXTRACTION       Current Outpatient Medications:  .  BAYER CONTOUR NEXT TEST test strip, , Disp: , Rfl: 11 .  desogestrel-ethinyl estradiol (KARIVA) 0.15-0.02/0.01 MG (21/5) tablet, Take 1 tablet by mouth daily., Disp: 1 Package, Rfl: 11 .  dextroamphetamine (DEXEDRINE SPANSULE) 15 MG 24 hr capsule, Take 30 mg by mouth 2 (two) times daily., Disp: , Rfl:  .  enalapril (VASOTEC) 10 MG tablet, Take 10 mg by mouth daily., Disp: , Rfl:  .  Insulin Human (INSULIN PUMP) SOLN, Inject 1 each into the skin continuous. Humalog insulin in pump, up to 150 units per day, Disp: , Rfl:  .  insulin lispro (HUMALOG) 100 UNIT/ML KiwkPen, as needed, Disp: , Rfl:  .  meloxicam (MOBIC) 15 MG tablet, Take 1 tablet (15 mg total) by mouth daily., Disp: 10 tablet, Rfl: 0 .  zolpidem (AMBIEN)  5 MG tablet, Take 2 tablets (10 mg total) by mouth at bedtime as needed for sleep., Disp: 20 tablet, Rfl: 0  Allergies  Allergen Reactions  . Amoxicillin Rash    Has patient had a PCN reaction causing immediate rash, facial/tongue/throat swelling, SOB or lightheadedness with hypotension: no Has patient had a PCN reaction causing severe rash involving mucus membranes or skin necrosis: yes Has patient had a PCN reaction that required hospitalization no Has patient had a PCN reaction occurring within the last 10 years: no If all of the above answers are "NO", then may proceed with Cephalosporin use.         Objective:   Physical Exam General: AAO x3, NAD  Dermatological: Skin is warm, dry and supple bilateral. Nails x 10 are well manicured; remaining integument appears unremarkable at this time. There are no open sores, no preulcerative lesions, no rash or signs of infection present.  Vascular: Dorsalis Pedis artery and Posterior Tibial artery pedal pulses are 2/4 bilateral with immedate capillary fill time. Pedal hair growth present. No varicosities and no lower extremity edema present bilateral. There is no pain with calf compression, swelling, warmth, erythema.   Neruologic: Grossly intact via light touch bilateral. Vibratory intact via tuning fork bilateral. Protective threshold with Semmes Wienstein monofilament intact to all pedal sites bilateral.   Musculoskeletal: Mild bunion deformities present bilaterally with  the right side worse than left.  There is tenderness mostly in dorsiflexion of the right first MPJ with restriction.  This is where she is majority discomfort.  Mild discomfort of the first MPJ.  Also semirigid hammertoe contracture present of the right second toe.  No other areas of tenderness.    Gait: Unassisted, Nonantalgic.     Assessment & Plan:  33 year old female with capsulitis, hallux limitus right foot/hammertoe deformity -Treatment options discussed including  all alternatives, risks, and complications -Etiology of symptoms were discussed -She brought in x-rays from last year to review.  I will obtain new x-rays to see there is been any change.  Elevation of the first ray is present.  No evidence of acute fracture.  Hammertoe contracture present in the second digit. -We long discussion regards to both conservative as well as surgical treatment options.  Made her an offloading pad for the first MPJ.  On the way out she states is Artie was very helpful.  Discussed a custom orthotic.  We discussed surgical intervention including a Youngswick bunionectomy with second digit PIPJ arthroplasty.  Vivi BarrackMatthew R Wagoner DPM

## 2018-07-27 DIAGNOSIS — M1990 Unspecified osteoarthritis, unspecified site: Secondary | ICD-10-CM | POA: Diagnosis not present

## 2018-07-27 DIAGNOSIS — M542 Cervicalgia: Secondary | ICD-10-CM | POA: Diagnosis not present

## 2018-07-27 DIAGNOSIS — M549 Dorsalgia, unspecified: Secondary | ICD-10-CM | POA: Diagnosis not present

## 2018-08-18 DIAGNOSIS — K080 Exfoliation of teeth due to systemic causes: Secondary | ICD-10-CM | POA: Diagnosis not present

## 2018-09-02 DIAGNOSIS — Z1589 Genetic susceptibility to other disease: Secondary | ICD-10-CM | POA: Diagnosis not present

## 2018-09-02 DIAGNOSIS — E1065 Type 1 diabetes mellitus with hyperglycemia: Secondary | ICD-10-CM | POA: Diagnosis not present

## 2018-09-02 DIAGNOSIS — G8929 Other chronic pain: Secondary | ICD-10-CM | POA: Diagnosis not present

## 2018-09-02 DIAGNOSIS — M545 Low back pain: Secondary | ICD-10-CM | POA: Diagnosis not present

## 2018-09-13 DIAGNOSIS — J3489 Other specified disorders of nose and nasal sinuses: Secondary | ICD-10-CM | POA: Diagnosis not present

## 2018-09-13 DIAGNOSIS — J343 Hypertrophy of nasal turbinates: Secondary | ICD-10-CM | POA: Diagnosis not present

## 2018-09-13 DIAGNOSIS — J310 Chronic rhinitis: Secondary | ICD-10-CM | POA: Diagnosis not present

## 2018-09-13 DIAGNOSIS — H6982 Other specified disorders of Eustachian tube, left ear: Secondary | ICD-10-CM | POA: Diagnosis not present

## 2018-09-30 DIAGNOSIS — J310 Chronic rhinitis: Secondary | ICD-10-CM | POA: Diagnosis not present

## 2018-09-30 DIAGNOSIS — F458 Other somatoform disorders: Secondary | ICD-10-CM | POA: Diagnosis not present

## 2018-09-30 DIAGNOSIS — J343 Hypertrophy of nasal turbinates: Secondary | ICD-10-CM | POA: Diagnosis not present

## 2018-09-30 DIAGNOSIS — H9193 Unspecified hearing loss, bilateral: Secondary | ICD-10-CM | POA: Diagnosis not present

## 2018-09-30 DIAGNOSIS — H6983 Other specified disorders of Eustachian tube, bilateral: Secondary | ICD-10-CM | POA: Diagnosis not present

## 2018-09-30 DIAGNOSIS — H938X2 Other specified disorders of left ear: Secondary | ICD-10-CM | POA: Diagnosis not present

## 2018-10-19 DIAGNOSIS — E785 Hyperlipidemia, unspecified: Secondary | ICD-10-CM | POA: Diagnosis not present

## 2018-10-19 DIAGNOSIS — E119 Type 2 diabetes mellitus without complications: Secondary | ICD-10-CM | POA: Diagnosis not present

## 2018-10-25 DIAGNOSIS — E785 Hyperlipidemia, unspecified: Secondary | ICD-10-CM | POA: Diagnosis not present

## 2018-10-25 DIAGNOSIS — I10 Essential (primary) hypertension: Secondary | ICD-10-CM | POA: Diagnosis not present

## 2018-10-25 DIAGNOSIS — Z6831 Body mass index (BMI) 31.0-31.9, adult: Secondary | ICD-10-CM | POA: Diagnosis not present

## 2018-10-25 DIAGNOSIS — E7800 Pure hypercholesterolemia, unspecified: Secondary | ICD-10-CM | POA: Diagnosis not present

## 2018-10-25 DIAGNOSIS — Z8659 Personal history of other mental and behavioral disorders: Secondary | ICD-10-CM | POA: Diagnosis not present

## 2018-10-25 DIAGNOSIS — Z9641 Presence of insulin pump (external) (internal): Secondary | ICD-10-CM | POA: Diagnosis not present

## 2018-10-25 DIAGNOSIS — Z794 Long term (current) use of insulin: Secondary | ICD-10-CM | POA: Diagnosis not present

## 2018-10-25 DIAGNOSIS — E1065 Type 1 diabetes mellitus with hyperglycemia: Secondary | ICD-10-CM | POA: Diagnosis not present

## 2018-10-25 DIAGNOSIS — E6609 Other obesity due to excess calories: Secondary | ICD-10-CM | POA: Diagnosis not present

## 2018-10-27 DIAGNOSIS — H40023 Open angle with borderline findings, high risk, bilateral: Secondary | ICD-10-CM | POA: Diagnosis not present

## 2018-10-27 DIAGNOSIS — Z794 Long term (current) use of insulin: Secondary | ICD-10-CM | POA: Diagnosis not present

## 2018-10-27 DIAGNOSIS — E103293 Type 1 diabetes mellitus with mild nonproliferative diabetic retinopathy without macular edema, bilateral: Secondary | ICD-10-CM | POA: Diagnosis not present

## 2018-10-27 DIAGNOSIS — H40053 Ocular hypertension, bilateral: Secondary | ICD-10-CM | POA: Diagnosis not present

## 2018-11-04 ENCOUNTER — Encounter: Payer: Self-pay | Admitting: Podiatry

## 2018-11-07 ENCOUNTER — Telehealth: Payer: Self-pay | Admitting: *Deleted

## 2018-11-07 NOTE — Telephone Encounter (Signed)
"  I'm a patient of Dr. Jacqualyn Posey.  I know I need surgery.  I"m wondering how far out it will be for scheduling surgery.  I am trying to get an idea for my job."  Dr. Leigh Aurora schedule is open.  He can do it any Wednesday in October except for November 23, 2018.  "Oh, so he does surgeries on Wednesdays.  I know I need to see him.  Do I have to wait until I come in to see him to schedule the surgery?"  Yes, we will schedule your surgery whenever you come in for an appointment.  "That is all I needed to know."

## 2018-11-28 ENCOUNTER — Other Ambulatory Visit: Payer: Self-pay

## 2018-11-28 ENCOUNTER — Ambulatory Visit (INDEPENDENT_AMBULATORY_CARE_PROVIDER_SITE_OTHER): Payer: Federal, State, Local not specified - PPO | Admitting: Podiatry

## 2018-11-28 ENCOUNTER — Encounter: Payer: Self-pay | Admitting: Podiatry

## 2018-11-28 ENCOUNTER — Ambulatory Visit (INDEPENDENT_AMBULATORY_CARE_PROVIDER_SITE_OTHER): Payer: Federal, State, Local not specified - PPO

## 2018-11-28 DIAGNOSIS — M205X1 Other deformities of toe(s) (acquired), right foot: Secondary | ICD-10-CM

## 2018-11-28 DIAGNOSIS — H938X2 Other specified disorders of left ear: Secondary | ICD-10-CM | POA: Insufficient documentation

## 2018-11-28 DIAGNOSIS — M2041 Other hammer toe(s) (acquired), right foot: Secondary | ICD-10-CM | POA: Diagnosis not present

## 2018-11-28 DIAGNOSIS — M779 Enthesopathy, unspecified: Secondary | ICD-10-CM

## 2018-11-28 NOTE — Patient Instructions (Signed)
Pre-Operative Instructions  Congratulations, you have decided to take an important step towards improving your quality of life.  You can be assured that the doctors and staff at [facility] will be with you every step of the way.  Here are some important things you should know:  1. Plan to be at the surgery center/hospital at least 1 (one) hour prior to your scheduled time, unless otherwise directed by the surgical center/hospital staff.  You must have a responsible adult accompany you, remain during the surgery and drive you home.  Make sure you have directions to the surgical center/hospital to ensure you arrive on time. 2. If you are having surgery at [facility] or [facility] hospitals, you will need a copy of your medical history and physical form from your family physician within one month prior to the date of surgery. We will give you a form for your primary physician to complete.  3. We make every effort to accommodate the date you request for surgery.  However, there are times where surgery dates or times have to be moved.  We will contact you as soon as possible if a change in schedule is required.   4. No aspirin/ibuprofen for one week before surgery.  If you are on aspirin, any non-steroidal anti-inflammatory medications (Mobic, Aleve, Ibuprofen) should not be taken seven (7) days prior to your surgery.  You make take Tylenol for pain prior to surgery.  5. Medications - If you are taking daily heart and blood pressure medications, seizure, reflux, allergy, asthma, anxiety, pain or diabetes medications, make sure you notify the surgery center/hospital before the day of surgery so they can tell you which medications you should take or avoid the day of surgery. 6. No food or drink after midnight the night before surgery unless directed otherwise by surgical center/hospital staff. 7. No alcoholic beverages 24-hours prior to surgery.  No smoking 24-hours prior or 24-hours after  surgery. 8. Wear loose pants or shorts. They should be loose enough to fit over bandages, boots, and casts. 9. Don't wear slip-on shoes. Sneakers are preferred. 10. Bring your boot with you to the surgery center/hospital.  Also bring crutches or a walker if your physician has prescribed it for you.  If you do not have this equipment, it will be provided for you after surgery. 11. If you have not been contacted by the surgery center/hospital by the day before your surgery, call to confirm the date and time of your surgery. 12. Leave-time from work may vary depending on the type of surgery you have.  Appropriate arrangements should be made prior to surgery with your employer. 13. Prescriptions will be provided immediately following surgery by your doctor.  Fill these as soon as possible after surgery and take the medication as directed. Pain medications will not be refilled on weekends and must be approved by the doctor. 14. Remove nail polish on the operative foot and avoid getting pedicures prior to surgery. 15. Wash the night before surgery.  The night before surgery wash the foot and leg well with water and the antibacterial soap provided. Be sure to pay special attention to beneath the toenails and in between the toes.  Wash for at least three (3) minutes. Rinse thoroughly with water and dry well with a towel.  Perform this wash unless told not to do so by your physician.  Enclosed: 1 Ice pack (please put in freezer the night before surgery)   1 Hibiclens skin cleaner     Pre-op instructions  If you have any questions regarding the instructions, please do not hesitate to call our office.  [place]: [address], [place], [place] [address] -- [phone]  [place]: [address] [place], [place] [address] -- [phone]  [place]: [address]  [place], [place] [address] -- [phone]   Website: [email or web address] 

## 2018-11-29 ENCOUNTER — Encounter: Payer: Self-pay | Admitting: Podiatry

## 2018-11-29 DIAGNOSIS — M2041 Other hammer toe(s) (acquired), right foot: Secondary | ICD-10-CM

## 2018-11-29 DIAGNOSIS — M205X1 Other deformities of toe(s) (acquired), right foot: Secondary | ICD-10-CM

## 2018-11-29 DIAGNOSIS — M779 Enthesopathy, unspecified: Secondary | ICD-10-CM

## 2018-12-01 ENCOUNTER — Encounter: Payer: Self-pay | Admitting: Podiatry

## 2018-12-05 ENCOUNTER — Telehealth: Payer: Self-pay | Admitting: *Deleted

## 2018-12-05 DIAGNOSIS — M2041 Other hammer toe(s) (acquired), right foot: Secondary | ICD-10-CM

## 2018-12-05 DIAGNOSIS — M779 Enthesopathy, unspecified: Secondary | ICD-10-CM

## 2018-12-05 DIAGNOSIS — M205X1 Other deformities of toe(s) (acquired), right foot: Secondary | ICD-10-CM

## 2018-12-05 NOTE — Progress Notes (Signed)
Subjective: 33 year old female presents the office today for surgical consultation given worsening pain on her right foot she points to the first MPJ.  She states that she has pain in the joint on daily basis and she is also started to get pain she points to the sesamoids.  She wants to make sure is nothing else going on she is asking about an MRI.  She has tried modifications of shoes, offloading and padding.  Offloading with a first ray cut out was helpful. Denies any systemic complaints such as fevers, chills, nausea, vomiting. No acute changes since last appointment, and no other complaints at this time.   Objective: AAO x3, NAD DP/PT pulses palpable bilaterally, CRT less than 3 seconds There is tenderness with first MPJ range of motion.  Mild restriction in dorsiflexion of the first MPJ range of motion at end range.  No significant crepitation.  There is no symptoms today with some discomfort on the plantar aspect with prominent sesamoids.  Hammertoe also present in the second digit causing discomfort.  No area pinpoint tenderness.  No significant edema no erythema or warmth. No pain with calf compression, swelling, warmth, erythema  Assessment: Capsulitis, hallux limitus right foot; second digit hammertoe  Plan: -All treatment options discussed with the patient including all alternatives, risks, complications.  -Discussion regards to treatment options.  We will order an MRI to evaluate the integrity of first MPJ. -Discussed surgical intervention.  We discussed a modified Austin bunionectomy decompress the first MPJ as well as arthroplasty of the second PIPJ.  Discussed the surgery as well as postoperative course.  She wished to proceed. -The incision placement as well as the postoperative course was discussed with the patient. I discussed risks of the surgery which include, but not limited to, infection, bleeding, pain, swelling, need for further surgery, delayed or nonhealing, painful or ugly  scar, numbness or sensation changes, over/under correction, recurrence, transfer lesions, further deformity, hardware failure, DVT/PE, loss of toe/foot. Patient understands these risks and wishes to proceed with surgery. The surgical consent was reviewed with the patient all 3 pages were signed. No promises or guarantees were given to the outcome of the procedure. All questions were answered to the best of my ability. Before the surgery the patient was encouraged to call the office if there is any further questions. The surgery will be performed at the [facility] on an outpatient basis. -Patient encouraged to call the office with any questions, concerns, change in symptoms.   Trula Slade DPM

## 2018-12-05 NOTE — Telephone Encounter (Signed)
-----   Message from Trula Slade, DPM sent at 12/05/2018  7:59 AM EDT ----- For her MRI can you have them do 66mm slices or as small as possible? Thanks.

## 2018-12-05 NOTE — Telephone Encounter (Signed)
I spoke with [facility] Kins, she states she will ask someone and call again.

## 2018-12-05 NOTE — Telephone Encounter (Signed)
I spoke with [facility] Luther Parody and she states can write in comments 2 mm slices.

## 2018-12-05 NOTE — Telephone Encounter (Signed)
[facility] Alisa Graff states returning my call.

## 2018-12-06 ENCOUNTER — Encounter: Payer: Self-pay | Admitting: Podiatry

## 2018-12-13 ENCOUNTER — Other Ambulatory Visit: Payer: Self-pay

## 2018-12-13 ENCOUNTER — Ambulatory Visit (INDEPENDENT_AMBULATORY_CARE_PROVIDER_SITE_OTHER): Payer: Federal, State, Local not specified - PPO

## 2018-12-13 DIAGNOSIS — M779 Enthesopathy, unspecified: Secondary | ICD-10-CM

## 2018-12-13 DIAGNOSIS — M205X1 Other deformities of toe(s) (acquired), right foot: Secondary | ICD-10-CM | POA: Diagnosis not present

## 2018-12-13 DIAGNOSIS — M2041 Other hammer toe(s) (acquired), right foot: Secondary | ICD-10-CM

## 2018-12-13 DIAGNOSIS — M19071 Primary osteoarthritis, right ankle and foot: Secondary | ICD-10-CM | POA: Diagnosis not present

## 2018-12-19 ENCOUNTER — Telehealth: Payer: Self-pay | Admitting: Podiatry

## 2018-12-19 NOTE — Telephone Encounter (Signed)
Pts husband came by the office today and picked up a surgery shoe for the patient to start wearing but when he took it home it looks a little big on her foot. Pts husband just wants to make sure the shoe is the correct size and discuss how it should look.  Please give pts husband a call back.

## 2018-12-20 ENCOUNTER — Encounter: Payer: Self-pay | Admitting: Podiatry

## 2018-12-22 ENCOUNTER — Telehealth: Payer: Self-pay | Admitting: *Deleted

## 2018-12-22 ENCOUNTER — Other Ambulatory Visit: Payer: Federal, State, Local not specified - PPO

## 2018-12-22 DIAGNOSIS — T148XXA Other injury of unspecified body region, initial encounter: Secondary | ICD-10-CM

## 2018-12-22 NOTE — Telephone Encounter (Signed)
Faxed orders to [employer], given to Dr. Leigh Aurora assistant for pre-cert.

## 2018-12-22 NOTE — Telephone Encounter (Signed)
-----   Message from Trula Slade, DPM sent at 12/22/2018  7:03 AM EST ----- Can you please order an MRI of the left foot to rule out FHL tear in the toe? Thanks.

## 2018-12-23 NOTE — Telephone Encounter (Signed)
Unable to fax or contact the [facility] on 12/22/2018, gave copy of orders to Dr. Leigh Aurora assistant to deliver to the radiology and pre-cert.

## 2018-12-27 ENCOUNTER — Other Ambulatory Visit: Payer: Self-pay

## 2018-12-27 ENCOUNTER — Ambulatory Visit (INDEPENDENT_AMBULATORY_CARE_PROVIDER_SITE_OTHER): Payer: Federal, State, Local not specified - PPO

## 2018-12-27 DIAGNOSIS — T148XXA Other injury of unspecified body region, initial encounter: Secondary | ICD-10-CM

## 2018-12-27 DIAGNOSIS — M79672 Pain in left foot: Secondary | ICD-10-CM | POA: Diagnosis not present

## 2018-12-29 ENCOUNTER — Other Ambulatory Visit: Payer: Self-pay

## 2018-12-29 ENCOUNTER — Ambulatory Visit (INDEPENDENT_AMBULATORY_CARE_PROVIDER_SITE_OTHER): Payer: Federal, State, Local not specified - PPO | Admitting: Podiatry

## 2018-12-29 DIAGNOSIS — M2041 Other hammer toe(s) (acquired), right foot: Secondary | ICD-10-CM

## 2018-12-29 DIAGNOSIS — T148XXA Other injury of unspecified body region, initial encounter: Secondary | ICD-10-CM

## 2018-12-29 DIAGNOSIS — M216X1 Other acquired deformities of right foot: Secondary | ICD-10-CM | POA: Diagnosis not present

## 2018-12-29 DIAGNOSIS — M779 Enthesopathy, unspecified: Secondary | ICD-10-CM

## 2019-01-02 ENCOUNTER — Telehealth: Payer: Self-pay | Admitting: *Deleted

## 2019-01-02 DIAGNOSIS — Z01419 Encounter for gynecological examination (general) (routine) without abnormal findings: Secondary | ICD-10-CM | POA: Diagnosis not present

## 2019-01-02 DIAGNOSIS — Z1151 Encounter for screening for human papillomavirus (HPV): Secondary | ICD-10-CM | POA: Diagnosis not present

## 2019-01-02 DIAGNOSIS — R102 Pelvic and perineal pain: Secondary | ICD-10-CM | POA: Diagnosis not present

## 2019-01-02 DIAGNOSIS — N6320 Unspecified lump in the left breast, unspecified quadrant: Secondary | ICD-10-CM | POA: Diagnosis not present

## 2019-01-02 NOTE — Telephone Encounter (Signed)
Spoke with [employer] Employee representative [place] and the representative stated that there was no authorization for the procedure code [id number] and the reference number is Stacie B 01/02/2019. Lattie Haw

## 2019-01-03 ENCOUNTER — Telehealth: Payer: Self-pay | Admitting: *Deleted

## 2019-01-03 NOTE — Telephone Encounter (Signed)
DOS 01/11/2019 [place]  - [id number] AND HAMMER TOE REPAIR 2ND - [id number] RIGHT FOOT  BCBS: Eligibility Date - 02/14/2017 - 02/11/2019   Network    Max Per Benefit Period Year-to-Date Remaining  CoInsurance     Deductible     Out-Of-Pocket 3  $5,500.00 $2,072.65   In-Network  [employer] Coinsurance Authorization Required  Not Applicable  82%  No  Messages: AGENTS, DRUGS AND/OR SUPPLIES ADMINISTERED OR OBTAINED IN CONNECTION WITH CARE

## 2019-01-04 DIAGNOSIS — F909 Attention-deficit hyperactivity disorder, unspecified type: Secondary | ICD-10-CM | POA: Diagnosis not present

## 2019-01-04 DIAGNOSIS — Z0000 Encounter for general adult medical examination without abnormal findings: Secondary | ICD-10-CM | POA: Diagnosis not present

## 2019-01-04 DIAGNOSIS — L709 Acne, unspecified: Secondary | ICD-10-CM | POA: Diagnosis not present

## 2019-01-04 DIAGNOSIS — G4700 Insomnia, unspecified: Secondary | ICD-10-CM | POA: Diagnosis not present

## 2019-01-04 DIAGNOSIS — E103393 Type 1 diabetes mellitus with moderate nonproliferative diabetic retinopathy without macular edema, bilateral: Secondary | ICD-10-CM | POA: Diagnosis not present

## 2019-01-04 NOTE — Progress Notes (Signed)
Subjective: 33 year old female presents the office today for surgical consultation as well as to discuss the MRI results in person.  She said that she still gets a lot of discomfort mostly she points on the first MPJ.  My last saw her she started get discomfort in the bottom of the joint on the plantar aspect.  She denies any recent injury.  She denies any swelling or any redness.  She states the left foot is starting to become more tender because she is compensating. Denies any systemic complaints such as fevers, chills, nausea, vomiting. No acute changes since last appointment, and no other complaints at this time.   Objective: AAO x3, NAD DP/PT pulses palpable bilaterally, CRT less than 3 seconds There is still tenderness of the first MPJ on the right foot mostly in range of motion.  Mild discomfort to the plantar aspect of the joint as well.  Semirigid hammertoe contracture present to right second toe.  There is equinus deformity present and is difficult to get her to 90 degrees.  She does feel when she walks she puts more pressure in the ball of her foot. No open lesions or pre-ulcerative lesions.  No pain with calf compression, swelling, warmth, erythema  Assessment: Capsulitis right first MPJ, hammertoe deformity with flexor tendinitis, concern for partial tear; equinus  Plan: -All treatment options discussed with the patient including all alternatives, risks, complications.  -I reviewed the MRIs with her.  We had a long discussion regards with conservative as well as surgical treatment options.  She wants to proceed with surgical intervention.  Given the MRI findings and change in clinical symptoms it does change with surgery.  I still think we can do an arthroplasty of the second digit but also recommend gastrocnemius recession Of the equinus.  Today I did do a diagnostic injection to the dorsal lateral aspect of the joint on the first MPJ to see if she gets relief.  Is difficult to  distinguish if she is actually having joint pain or if this is coming from the flexor tendinitis/partial tear.  We will see how she does with the injection and discuss further treatment, surgical intervention in the next appointment.  She is in agreement to this. -Patient encouraged to call the office with any questions, concerns, change in symptoms.   Procedure: Injection small joint Discussed alternatives, risks, complications and verbal consent was obtained.  Location: Right first MPJ Skin Prep: Betadine Injectate: 0.5cc 0.5% marcaine plain, 0.25 cc dexamethasone phosphate.  Care was taken to inject directly into the joint stayed within the capsule and not go around the flexor tendon.  Continue mobilization in the surgical shoe. Disposition: Patient tolerated procedure well. Injection site dressed with a band-aid.  Post-injection care was discussed and return precautions discussed.   Trula Slade DPM

## 2019-01-09 ENCOUNTER — Ambulatory Visit: Payer: Federal, State, Local not specified - PPO | Admitting: Podiatry

## 2019-01-09 ENCOUNTER — Encounter: Payer: Self-pay | Admitting: Podiatry

## 2019-01-09 ENCOUNTER — Telehealth: Payer: Self-pay | Admitting: Podiatry

## 2019-01-09 ENCOUNTER — Other Ambulatory Visit: Payer: Self-pay

## 2019-01-09 DIAGNOSIS — M2041 Other hammer toe(s) (acquired), right foot: Secondary | ICD-10-CM | POA: Diagnosis not present

## 2019-01-09 DIAGNOSIS — M779 Enthesopathy, unspecified: Secondary | ICD-10-CM

## 2019-01-09 DIAGNOSIS — G4700 Insomnia, unspecified: Secondary | ICD-10-CM | POA: Insufficient documentation

## 2019-01-09 DIAGNOSIS — T148XXA Other injury of unspecified body region, initial encounter: Secondary | ICD-10-CM

## 2019-01-09 DIAGNOSIS — M216X1 Other acquired deformities of right foot: Secondary | ICD-10-CM

## 2019-01-09 NOTE — Patient Instructions (Signed)

## 2019-01-09 NOTE — Telephone Encounter (Signed)
Pt is scheduled for several procedures this coming Wednesday but had a question about the Gastrocnemius Recession. Pt wanted to know if the procedure would be done endoscopically. Please give patient a call.

## 2019-01-09 NOTE — Telephone Encounter (Signed)
Dr. Jacqualyn Posey, can you answer this question so I can call the patient with your response?

## 2019-01-09 NOTE — Telephone Encounter (Signed)
I sent her a mychart message in response.

## 2019-01-11 ENCOUNTER — Other Ambulatory Visit: Payer: Self-pay | Admitting: Podiatry

## 2019-01-11 DIAGNOSIS — M2011 Hallux valgus (acquired), right foot: Secondary | ICD-10-CM | POA: Diagnosis not present

## 2019-01-11 DIAGNOSIS — M205X1 Other deformities of toe(s) (acquired), right foot: Secondary | ICD-10-CM | POA: Diagnosis not present

## 2019-01-11 DIAGNOSIS — I10 Essential (primary) hypertension: Secondary | ICD-10-CM | POA: Diagnosis not present

## 2019-01-11 DIAGNOSIS — M2021 Hallux rigidus, right foot: Secondary | ICD-10-CM | POA: Diagnosis not present

## 2019-01-11 DIAGNOSIS — M2041 Other hammer toe(s) (acquired), right foot: Secondary | ICD-10-CM | POA: Diagnosis not present

## 2019-01-11 MED ORDER — PROMETHAZINE HCL 25 MG PO TABS: 25.0000 mg | ORAL_TABLET | Freq: Three times a day (TID) | ORAL | 0 refills | Status: DC | PRN

## 2019-01-11 MED ORDER — CLINDAMYCIN HCL 150 MG PO CAPS: 150.0000 mg | ORAL_CAPSULE | Freq: Three times a day (TID) | ORAL | 0 refills | Status: DC

## 2019-01-11 MED ORDER — HYDROCODONE-ACETAMINOPHEN 5-325 MG PO TABS: 1.0000 | ORAL_TABLET | Freq: Four times a day (QID) | ORAL | 0 refills | Status: DC | PRN

## 2019-01-11 NOTE — Progress Notes (Signed)
Postop medications sent 

## 2019-01-12 ENCOUNTER — Other Ambulatory Visit: Payer: Self-pay | Admitting: Podiatry

## 2019-01-12 ENCOUNTER — Telehealth: Payer: Self-pay | Admitting: Podiatry

## 2019-01-12 MED ORDER — HYDROCODONE-ACETAMINOPHEN 5-325 MG PO TABS: 1.0000 | ORAL_TABLET | Freq: Four times a day (QID) | ORAL | 0 refills | Status: AC | PRN

## 2019-01-12 NOTE — Telephone Encounter (Signed)
Pt husband called and stated that she is having a lot of pain since the sx and he would like to ask a question. Please call back

## 2019-01-12 NOTE — Telephone Encounter (Signed)
I spoke with pt's Husband, Shaun and states the pain medication wears off in about 4 hours and wanted to know if they could change the timing to every 4 hours. I asked Shaun the sensation pt was having and he got pt to the phone. I asked pt the character of her pain and she stated sharp and throbbing. I told pt the sharp or burning pain was typical surgical pain but the throbbing was possibly a tight ace bandage, applied after surgery to staunch postop bleeding and sometimes after it has done its job it is too tight. I instructed pt to remove the boot, open-ended sock and the ace wrap, leave the gauze dressing intact, elevate the foot for 15 minutes, but if pain worsens then dangle for 15 minutes, after 15 minutes then place foot level with the hip and loosely rewrap the ace starting at the toes and roll up the leg, reapply the sock and boot. I also instructed pt to take 800mg  ibuprofen every 8 hours, if can tolerate, between the dosing of the pain medication to extend the pain medication affect. Pt states the foot hurts worse when lifting from the floor while walking. I told her it is because accessory structures in the leg used to lift the foot are pulling on the surgery site, to use crutches or walker to put the foot down and up quickly while trying to walk as normal as possible in the boot. Pt requested a refill of the pain medication and told her I would inform Dr. Jacqualyn Posey.

## 2019-01-12 NOTE — Telephone Encounter (Signed)
I have sent it for her. She told us she was taking one every 4 hours.

## 2019-01-12 NOTE — Telephone Encounter (Signed)
She is going to run out over the weekend and she wanted to go ahead and get a refill. She is taking one tablet every 4 hours. She states she is doing better this afternoon than she was. Encouraged to wear the boot at all times, even at night. Ice/elevate.

## 2019-01-12 NOTE — Progress Notes (Signed)
Subjective: 33 year old female presents the office today for surgical consultation as well as for follow-up evaluation of the right foot pain.  She said the injection was helpful for a couple of days but the injection did wear off and she continued to have pain and she points on the plantar aspect of the first MPJ where she has majority discomfort she gets swelling after being on her feet elevated this area she reports.  She still wants to proceed with surgery particular to help with her right second hammertoe which was discussed the other options. Denies any systemic complaints such as fevers, chills, nausea, vomiting. No acute changes since last appointment, and no other complaints at this time.   Objective: AAO x3, NAD DP/PT pulses palpable bilaterally, CRT less than 3 seconds There is still tenderness at end range of motion of the first MPJ on the right side and the joint is limited in dorsiflexion and range of motion.  There is mild discomfort so on the plantar aspect of the joint this is more on the flexor tendon.  This is a newer symptom that she reported.  Semirigid hammertoe contracture present on the right second toe.  Equinus is present. No pain with calf compression, swelling, warmth, erythema  Assessment: 33 year old female with chronic right foot pain, hallux limitus/second digit hammertoe deformity, equinus with suspected flexor partial tear  Plan: -All treatment options discussed with the patient including all alternatives, risks, complications.  -Again today we had a long discussion regards to treatment options.  After a long conversation versus the option she wants to proceed with surgery.  I recommended the gastrocnemius recession, PIPJ arthroplasty of the right second toe.  Also discussed osteotomy versus cheilectomy of the first MPJ.  Given the flexor tearing/plantar discomfort I do not want to decompress the joint too much to avoid any further injury.  We will proceed with a  cheilectomy. -She is in agreement.  We will proceed with gastrocnemius recession, cheilectomy, PIPJ arthroplasty of the second digit. -The incision placement as well as the postoperative course was discussed with the patient. I discussed risks of the surgery which include, but not limited to, infection, bleeding, pain, swelling, need for further surgery, delayed or nonhealing, painful or ugly scar, numbness or sensation changes, over/under correction, recurrence, transfer lesions, further deformity, hardware failure, DVT/PE, loss of toe/foot. Patient understands these risks and wishes to proceed with surgery. The surgical consent was reviewed with the patient all 3 pages were signed. No promises or guarantees were given to the outcome of the procedure. All questions were answered to the best of my ability. Before the surgery the patient was encouraged to call the office if there is any further questions. The surgery will be performed at the [facility] on an outpatient basis.  I spent 30 minutes with the patient and greater than 50% of the time was in face to face discussion.   Trula Slade DPM

## 2019-01-13 ENCOUNTER — Telehealth: Payer: Self-pay | Admitting: *Deleted

## 2019-01-13 NOTE — Telephone Encounter (Signed)
Called and spoke with the patient yesterday and the patient stated that the pain was worse than the Wednesday after surgery and was nauseous and there has been some throbbing and the ache was bad and when I got up I had a sharp pain with movement but when I sat down it went away and I would like a refill of my pain medicine, patient stated that she did not want to run out over the weekend and I stated that Dr Jacqualyn Posey has already sent in a refill but due do the law of how many pain medicine refills you can get, the Rx would not be ready until Saturday for pick up and I stated to call the office if any concerns or questions. Lattie Haw

## 2019-01-19 ENCOUNTER — Ambulatory Visit (INDEPENDENT_AMBULATORY_CARE_PROVIDER_SITE_OTHER): Payer: Federal, State, Local not specified - PPO

## 2019-01-19 ENCOUNTER — Other Ambulatory Visit: Payer: Self-pay

## 2019-01-19 ENCOUNTER — Ambulatory Visit (INDEPENDENT_AMBULATORY_CARE_PROVIDER_SITE_OTHER): Payer: Federal, State, Local not specified - PPO | Admitting: Podiatry

## 2019-01-19 ENCOUNTER — Encounter: Payer: Self-pay | Admitting: Podiatry

## 2019-01-19 DIAGNOSIS — M779 Enthesopathy, unspecified: Secondary | ICD-10-CM

## 2019-01-19 DIAGNOSIS — M2041 Other hammer toe(s) (acquired), right foot: Secondary | ICD-10-CM | POA: Diagnosis not present

## 2019-01-19 DIAGNOSIS — M205X1 Other deformities of toe(s) (acquired), right foot: Secondary | ICD-10-CM

## 2019-01-19 DIAGNOSIS — Z9889 Other specified postprocedural states: Secondary | ICD-10-CM

## 2019-01-26 ENCOUNTER — Ambulatory Visit: Payer: Federal, State, Local not specified - PPO

## 2019-01-26 ENCOUNTER — Other Ambulatory Visit: Payer: Self-pay

## 2019-01-26 ENCOUNTER — Ambulatory Visit (INDEPENDENT_AMBULATORY_CARE_PROVIDER_SITE_OTHER): Payer: Federal, State, Local not specified - PPO | Admitting: Podiatry

## 2019-01-26 DIAGNOSIS — M2041 Other hammer toe(s) (acquired), right foot: Secondary | ICD-10-CM

## 2019-01-26 DIAGNOSIS — M205X1 Other deformities of toe(s) (acquired), right foot: Secondary | ICD-10-CM

## 2019-01-26 NOTE — Progress Notes (Signed)
Subjective: Valerie Dennis is a 33 y.o. is seen today in office s/p right cheilectomy and 2nd PIPJ arthroplasty. They state their pain is controlled.  She has been wearing the cam boot.  She has been trying ice and elevate as well.  No recent injury.  Denies any systemic complaints such as fevers, chills, nausea, vomiting. No calf pain, chest pain, shortness of breath.   Objective: General: No acute distress, AAOx3  DP/PT pulses palpable 2/4, CRT < 3 sec to all digits.  Protective sensation intact. Motor function intact.  RIGHT foot: Incision is well coapted without any evidence of dehiscence and sutures intact. There is no surrounding erythema, ascending cellulitis, fluctuance, crepitus, malodor, drainage/purulence.  Minimal bruising.  There is mild edema around the surgical site. There is mild pain along the surgical site.  No other areas of tenderness to bilateral lower extremities.  No other open lesions or pre-ulcerative lesions.  No pain with calf compression, swelling, warmth, erythema.   Assessment and Plan:  Status post right foot surgery, doing well with no complications   -Treatment options discussed including all alternatives, risks, and complications -X-rays obtained and reviewed.  No evidence of acute fracture.  Status post cheilectomy the first metatarsal as well as hammertoe repair of the second digit. -Antibiotic ointment and a dressing applied.  Keep the dressing clean, dry, intact. -Remain in cam boot -Ice/elevation -Pain medication as needed. -Monitor for any clinical signs or symptoms of infection and DVT/PE and directed to call the office immediately should any occur or go to the ER. -Follow-up as scheduled for possible suture removal or sooner if any problems arise. In the meantime, encouraged to call the office with any questions, concerns, change in symptoms.   Celesta Gentile, DPM

## 2019-01-29 NOTE — Progress Notes (Signed)
Subjective: Valerie Dennis is a 33 y.o. is seen today in office s/p right cheilectomy and 2nd PIPJ arthroplasty.  She presents today for follow-up and possible suture removal.  Overall she is been doing well she is not taking any narcotic pain medication.  She is still wearing the cam boot.  She is still icing elevating she denies any recent injury or falls or any changes otherwise.  She denies any fevers, chills, nausea, vomiting. No calf pain, chest pain, shortness of breath.   Objective: General: No acute distress, AAOx3  DP/PT pulses palpable 2/4, CRT < 3 sec to all digits.  Protective sensation intact. Motor function intact.  RIGHT foot: Incision is well coapted without any evidence of dehiscence and sutures intact. There is faint erythema likely multiple inflammation as opposed to infection as there is no drainage or pus or increase in warmth.  There is no fluctuation crepitation.  There is no malodor.  Minimal tenderness palpation at surgical sites. No other areas of tenderness to bilateral lower extremities.  No other open lesions or pre-ulcerative lesions.  No pain with calf compression, swelling, warmth, erythema.   Assessment and Plan:  Status post right foot surgery, doing well with no complications   -Treatment options discussed including all alternatives, risks, and complications -I removed a few of the sutures on the hammertoe and there is also mild motion across the incision site left the rest intact.  Antibiotic ointment and a dressing applied.  Keep the dressing clean, dry, intact. -Remain in cam boot.  Discussed gentle range of motion exercises. -Ice and elevation. -Pain medication as needed. -Monitor for any clinical signs or symptoms of infection and DVT/PE and directed to call the office immediately should any occur or go to the ER. -Follow-up as scheduled for possible suture removal or sooner if any problems arise. In the meantime, encouraged to call the office with any  questions, concerns, change in symptoms.   Celesta Gentile, DPM

## 2019-02-07 ENCOUNTER — Encounter: Payer: Federal, State, Local not specified - PPO | Admitting: Podiatry

## 2019-02-09 ENCOUNTER — Other Ambulatory Visit: Payer: Self-pay

## 2019-02-09 ENCOUNTER — Ambulatory Visit (INDEPENDENT_AMBULATORY_CARE_PROVIDER_SITE_OTHER): Payer: Federal, State, Local not specified - PPO | Admitting: Podiatry

## 2019-02-09 DIAGNOSIS — Z9889 Other specified postprocedural states: Secondary | ICD-10-CM

## 2019-02-09 DIAGNOSIS — M2041 Other hammer toe(s) (acquired), right foot: Secondary | ICD-10-CM

## 2019-02-09 DIAGNOSIS — M205X1 Other deformities of toe(s) (acquired), right foot: Secondary | ICD-10-CM

## 2019-02-09 NOTE — Progress Notes (Signed)
Subjective: Valerie Dennis is a 33 y.o. is seen today in office s/p right cheilectomy and 2nd PIPJ arthroplasty.  She states that overall she is doing well.  She has been moving her toes some and she gets some discomfort with range of motion of the hallux towards the end range of motion.  She is doing a surgical boot she denies any recent injury or falls and she has no other concerns today. She denies any fevers, chills, nausea, vomiting. No calf pain, chest pain, shortness of breath.   Objective: General: No acute distress, AAOx3  DP/PT pulses palpable 2/4, CRT < 3 sec to all digits.  Protective sensation intact. Motor function intact.  RIGHT foot: Incision is well coapted without any evidence sutures are still intact.  There is decreased edema there is no erythema or warmth there is no drainage or pus or any clinical signs of infection noted today.  There does appear to be some discomfort dorsiflexion, plantarflexion of the first MPJ more towards the end range of motion. Toes are in a rectus position.  No other areas of tenderness to bilateral lower extremities.  No other open lesions or pre-ulcerative lesions.  No pain with calf compression, swelling, warmth, erythema.   Assessment and Plan:  Status post right foot surgery, doing well with no complications   -Treatment options discussed including all alternatives, risks, and complications -Remove the remainder the sutures today.  Incision was healing well.  Antibiotic ointment and a dressing applied.  She can remove the bandage tomorrow start to shower.  Continue cam boot for now.  We will start physical therapy next week.  She was asking to repeat MRI to ensure healing of the flexor tendon however given the recent surgery I would not recommend an MRI at this point given the inflammation from surgery. -Dispensed splint to help hold the hammertoe in rectus position.  Return in about 2 weeks (around 02/23/2019).  Trula Slade DPM

## 2019-02-09 NOTE — Addendum Note (Signed)
Addended by: Cranford Mon R on: 02/09/2019 02:23 PM   Modules accepted: Orders

## 2019-02-18 ENCOUNTER — Encounter: Payer: Self-pay | Admitting: Podiatry

## 2019-02-20 ENCOUNTER — Encounter: Payer: Self-pay | Admitting: Podiatry

## 2019-02-20 ENCOUNTER — Telehealth: Payer: Self-pay | Admitting: *Deleted

## 2019-02-20 NOTE — Telephone Encounter (Signed)
Left message informing pt she could contact [facility] to schedule.

## 2019-02-20 NOTE — Telephone Encounter (Signed)
Faxed required form, SnapShot with LOV notes, and demographics to [facility].

## 2019-02-20 NOTE — Telephone Encounter (Signed)
-----   Message from Matthew R Wagoner, DPM sent at 02/18/2019 11:18 AM EST ----- She sent a message stating that she has not heard from [facility] in [place]. Can we follow up on this for her and let her know? Thanks.   

## 2019-02-20 NOTE — Telephone Encounter (Signed)
-----   Message from Vivi Barrack, DPM sent at 02/18/2019 11:18 AM EST ----- She sent a message stating that she has not heard from [facility] in [place]. Can we follow up on this for her and let her know? Thanks.

## 2019-02-21 ENCOUNTER — Encounter: Payer: Self-pay | Admitting: Podiatry

## 2019-02-21 ENCOUNTER — Ambulatory Visit (INDEPENDENT_AMBULATORY_CARE_PROVIDER_SITE_OTHER): Payer: Federal, State, Local not specified - PPO

## 2019-02-21 ENCOUNTER — Other Ambulatory Visit: Payer: Self-pay

## 2019-02-21 ENCOUNTER — Ambulatory Visit (INDEPENDENT_AMBULATORY_CARE_PROVIDER_SITE_OTHER): Payer: Self-pay | Admitting: Podiatry

## 2019-02-21 DIAGNOSIS — N6320 Unspecified lump in the left breast, unspecified quadrant: Secondary | ICD-10-CM | POA: Diagnosis not present

## 2019-02-21 DIAGNOSIS — M216X1 Other acquired deformities of right foot: Secondary | ICD-10-CM

## 2019-02-21 DIAGNOSIS — M205X1 Other deformities of toe(s) (acquired), right foot: Secondary | ICD-10-CM

## 2019-02-21 DIAGNOSIS — N6011 Diffuse cystic mastopathy of right breast: Secondary | ICD-10-CM | POA: Diagnosis not present

## 2019-02-21 DIAGNOSIS — M2041 Other hammer toe(s) (acquired), right foot: Secondary | ICD-10-CM

## 2019-02-24 DIAGNOSIS — M25674 Stiffness of right foot, not elsewhere classified: Secondary | ICD-10-CM | POA: Diagnosis not present

## 2019-02-24 DIAGNOSIS — M62561 Muscle wasting and atrophy, not elsewhere classified, right lower leg: Secondary | ICD-10-CM | POA: Diagnosis not present

## 2019-02-24 DIAGNOSIS — R269 Unspecified abnormalities of gait and mobility: Secondary | ICD-10-CM | POA: Diagnosis not present

## 2019-02-24 DIAGNOSIS — M25671 Stiffness of right ankle, not elsewhere classified: Secondary | ICD-10-CM | POA: Diagnosis not present

## 2019-02-27 NOTE — Progress Notes (Signed)
Subjective: Valerie Dennis is a 34 y.o. is seen today in office s/p right cheilectomy and 2nd PIPJ arthroplasty.  She states that she is doing better on the right foot.  She is some occasional soreness and swelling but overall it has improved.  Denies any recent injury.  She is still been in the cam boot.  She has not yet started physical therapy. Denies any fevers, chills, nausea, vomiting. No calf pain, chest pain, shortness of breath.   Objective: General: No acute distress, AAOx3  DP/PT pulses palpable 2/4, CRT < 3 sec to all digits.  Protective sensation intact. Motor function intact.  RIGHT foot: Incision is well coapted without any evidence and scars have formed.  There is no significant tenderness palpation of the surgical site but there is decreased range of motion of the first MPJ.  There is decreased edema.  There is no erythema or warmth.  No clinical signs of infection noted today.  Decreased wound on the course of the flexor tendon.  No other areas of tenderness to bilateral lower extremities.  No other open lesions or pre-ulcerative lesions.  No pain with calf compression, swelling, warmth, erythema.   Assessment and Plan:  Status post right foot surgery, doing well with no complications   -Treatment options discussed including all alternatives, risks, and complications -X-rays obtained reviewed.  No evidence of acute fracture.  Status post arthroplasty of the second PIPJ as well as cheilectomy of the first metatarsal. -I want her to continue the cam boot for now.  She is scheduled start physical therapy later this week.  As she starts to progress with physical therapy we discussed transitioning back to regular shoe as able.  Continue to ice the area as well and gradual increase to activity level.  Return in about 4 weeks (around 03/21/2019).  Vivi Barrack DPM

## 2019-02-28 DIAGNOSIS — M25671 Stiffness of right ankle, not elsewhere classified: Secondary | ICD-10-CM | POA: Diagnosis not present

## 2019-02-28 DIAGNOSIS — R269 Unspecified abnormalities of gait and mobility: Secondary | ICD-10-CM | POA: Diagnosis not present

## 2019-02-28 DIAGNOSIS — M62561 Muscle wasting and atrophy, not elsewhere classified, right lower leg: Secondary | ICD-10-CM | POA: Diagnosis not present

## 2019-02-28 DIAGNOSIS — M25674 Stiffness of right foot, not elsewhere classified: Secondary | ICD-10-CM | POA: Diagnosis not present

## 2019-03-03 DIAGNOSIS — R269 Unspecified abnormalities of gait and mobility: Secondary | ICD-10-CM | POA: Diagnosis not present

## 2019-03-03 DIAGNOSIS — M62561 Muscle wasting and atrophy, not elsewhere classified, right lower leg: Secondary | ICD-10-CM | POA: Diagnosis not present

## 2019-03-03 DIAGNOSIS — M25671 Stiffness of right ankle, not elsewhere classified: Secondary | ICD-10-CM | POA: Diagnosis not present

## 2019-03-03 DIAGNOSIS — M25674 Stiffness of right foot, not elsewhere classified: Secondary | ICD-10-CM | POA: Diagnosis not present

## 2019-03-07 DIAGNOSIS — M25671 Stiffness of right ankle, not elsewhere classified: Secondary | ICD-10-CM | POA: Diagnosis not present

## 2019-03-07 DIAGNOSIS — M25674 Stiffness of right foot, not elsewhere classified: Secondary | ICD-10-CM | POA: Diagnosis not present

## 2019-03-07 DIAGNOSIS — M62561 Muscle wasting and atrophy, not elsewhere classified, right lower leg: Secondary | ICD-10-CM | POA: Diagnosis not present

## 2019-03-07 DIAGNOSIS — R269 Unspecified abnormalities of gait and mobility: Secondary | ICD-10-CM | POA: Diagnosis not present

## 2019-03-10 DIAGNOSIS — R269 Unspecified abnormalities of gait and mobility: Secondary | ICD-10-CM | POA: Diagnosis not present

## 2019-03-10 DIAGNOSIS — M25671 Stiffness of right ankle, not elsewhere classified: Secondary | ICD-10-CM | POA: Diagnosis not present

## 2019-03-10 DIAGNOSIS — M25674 Stiffness of right foot, not elsewhere classified: Secondary | ICD-10-CM | POA: Diagnosis not present

## 2019-03-10 DIAGNOSIS — M62561 Muscle wasting and atrophy, not elsewhere classified, right lower leg: Secondary | ICD-10-CM | POA: Diagnosis not present

## 2019-03-13 DIAGNOSIS — N644 Mastodynia: Secondary | ICD-10-CM | POA: Diagnosis not present

## 2019-03-13 DIAGNOSIS — R102 Pelvic and perineal pain: Secondary | ICD-10-CM | POA: Diagnosis not present

## 2019-03-16 DIAGNOSIS — M25674 Stiffness of right foot, not elsewhere classified: Secondary | ICD-10-CM | POA: Diagnosis not present

## 2019-03-16 DIAGNOSIS — M62561 Muscle wasting and atrophy, not elsewhere classified, right lower leg: Secondary | ICD-10-CM | POA: Diagnosis not present

## 2019-03-16 DIAGNOSIS — M25671 Stiffness of right ankle, not elsewhere classified: Secondary | ICD-10-CM | POA: Diagnosis not present

## 2019-03-16 DIAGNOSIS — R269 Unspecified abnormalities of gait and mobility: Secondary | ICD-10-CM | POA: Diagnosis not present

## 2019-03-21 ENCOUNTER — Ambulatory Visit (INDEPENDENT_AMBULATORY_CARE_PROVIDER_SITE_OTHER): Payer: Federal, State, Local not specified - PPO

## 2019-03-21 ENCOUNTER — Ambulatory Visit (INDEPENDENT_AMBULATORY_CARE_PROVIDER_SITE_OTHER): Payer: Federal, State, Local not specified - PPO | Admitting: Podiatry

## 2019-03-21 ENCOUNTER — Other Ambulatory Visit: Payer: Self-pay

## 2019-03-21 DIAGNOSIS — M2041 Other hammer toe(s) (acquired), right foot: Secondary | ICD-10-CM

## 2019-03-21 DIAGNOSIS — M216X1 Other acquired deformities of right foot: Secondary | ICD-10-CM

## 2019-03-21 DIAGNOSIS — M25674 Stiffness of right foot, not elsewhere classified: Secondary | ICD-10-CM | POA: Diagnosis not present

## 2019-03-21 DIAGNOSIS — M62561 Muscle wasting and atrophy, not elsewhere classified, right lower leg: Secondary | ICD-10-CM | POA: Diagnosis not present

## 2019-03-21 DIAGNOSIS — M216X2 Other acquired deformities of left foot: Secondary | ICD-10-CM | POA: Diagnosis not present

## 2019-03-21 DIAGNOSIS — M25671 Stiffness of right ankle, not elsewhere classified: Secondary | ICD-10-CM | POA: Diagnosis not present

## 2019-03-21 DIAGNOSIS — R269 Unspecified abnormalities of gait and mobility: Secondary | ICD-10-CM | POA: Diagnosis not present

## 2019-03-21 DIAGNOSIS — M205X1 Other deformities of toe(s) (acquired), right foot: Secondary | ICD-10-CM

## 2019-03-21 NOTE — Progress Notes (Signed)
Subjective: Valerie Dennis is a 34 y.o. is seen today in office s/p right cheilectomy and 2nd PIPJ arthroplasty.  Overall she states that she feels that she is healing and the foot is getting better although taking time.  She is doing physical therapy which is been very helpful.  She denies any recent injury or changes otherwise.  Denies any fevers, chills, nausea, vomiting. No calf pain, chest pain, shortness of breath.   Objective: General: No acute distress, AAOx3 - presents wearing a regular shoe.  DP/PT pulses palpable 2/4, CRT < 3 sec to all digits.  Protective sensation intact. Motor function intact.  RIGHT foot: Incisions are well-healed.  Minimal edema.  There is no erythema or warmth.  There is some restriction of the first MPJ range of motion however improved compared to last appointment.  No other areas of discomfort identified today. No other areas of tenderness to bilateral lower extremities.  This is improved with physical therapy. No other open lesions or pre-ulcerative lesions.  No pain with calf compression, swelling, warmth, erythema.   Assessment and Plan:  Status post right foot surgery, doing well with no complications   -Treatment options discussed including all alternatives, risks, and complications -X-rays obtained reviewed.  No evidence of acute fracture.  Status post arthroplasty of the second PIPJ as well as cheilectomy of the first metatarsal. -She is continuing a progress.  Continue physical therapy.  I want her to continue with regular shoes and try to gradually increase activity level.  When she is able to wear shoe full-time without significant discomfort will return to work.  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  Return in about 4 weeks (around 04/18/2019).  Vivi Barrack DPM

## 2019-03-27 DIAGNOSIS — M2041 Other hammer toe(s) (acquired), right foot: Secondary | ICD-10-CM | POA: Insufficient documentation

## 2019-03-27 DIAGNOSIS — M205X1 Other deformities of toe(s) (acquired), right foot: Secondary | ICD-10-CM | POA: Insufficient documentation

## 2019-03-27 DIAGNOSIS — M216X1 Other acquired deformities of right foot: Secondary | ICD-10-CM | POA: Insufficient documentation

## 2019-03-31 DIAGNOSIS — R269 Unspecified abnormalities of gait and mobility: Secondary | ICD-10-CM | POA: Diagnosis not present

## 2019-03-31 DIAGNOSIS — M62561 Muscle wasting and atrophy, not elsewhere classified, right lower leg: Secondary | ICD-10-CM | POA: Diagnosis not present

## 2019-03-31 DIAGNOSIS — M25671 Stiffness of right ankle, not elsewhere classified: Secondary | ICD-10-CM | POA: Diagnosis not present

## 2019-03-31 DIAGNOSIS — M25674 Stiffness of right foot, not elsewhere classified: Secondary | ICD-10-CM | POA: Diagnosis not present

## 2019-04-04 DIAGNOSIS — R269 Unspecified abnormalities of gait and mobility: Secondary | ICD-10-CM | POA: Diagnosis not present

## 2019-04-04 DIAGNOSIS — M25674 Stiffness of right foot, not elsewhere classified: Secondary | ICD-10-CM | POA: Diagnosis not present

## 2019-04-04 DIAGNOSIS — M25671 Stiffness of right ankle, not elsewhere classified: Secondary | ICD-10-CM | POA: Diagnosis not present

## 2019-04-04 DIAGNOSIS — M62561 Muscle wasting and atrophy, not elsewhere classified, right lower leg: Secondary | ICD-10-CM | POA: Diagnosis not present

## 2019-04-07 ENCOUNTER — Encounter: Payer: Self-pay | Admitting: Podiatry

## 2019-04-07 ENCOUNTER — Telehealth: Payer: Self-pay | Admitting: *Deleted

## 2019-04-07 ENCOUNTER — Other Ambulatory Visit: Payer: Self-pay

## 2019-04-07 ENCOUNTER — Ambulatory Visit (INDEPENDENT_AMBULATORY_CARE_PROVIDER_SITE_OTHER): Payer: Federal, State, Local not specified - PPO | Admitting: Podiatry

## 2019-04-07 VITALS — Temp 97.9°F

## 2019-04-07 DIAGNOSIS — M62561 Muscle wasting and atrophy, not elsewhere classified, right lower leg: Secondary | ICD-10-CM | POA: Diagnosis not present

## 2019-04-07 DIAGNOSIS — M2041 Other hammer toe(s) (acquired), right foot: Secondary | ICD-10-CM

## 2019-04-07 DIAGNOSIS — R269 Unspecified abnormalities of gait and mobility: Secondary | ICD-10-CM | POA: Diagnosis not present

## 2019-04-07 DIAGNOSIS — M216X1 Other acquired deformities of right foot: Secondary | ICD-10-CM

## 2019-04-07 DIAGNOSIS — M205X1 Other deformities of toe(s) (acquired), right foot: Secondary | ICD-10-CM

## 2019-04-07 DIAGNOSIS — M25674 Stiffness of right foot, not elsewhere classified: Secondary | ICD-10-CM | POA: Diagnosis not present

## 2019-04-07 DIAGNOSIS — M25671 Stiffness of right ankle, not elsewhere classified: Secondary | ICD-10-CM | POA: Diagnosis not present

## 2019-04-07 NOTE — Telephone Encounter (Signed)
Called patient to let them know that we forgot to give her two compression anklets from her visit today with Dr. Ardelle Anton.  I told patient that she could pickup today before we left or come back next Tuesday or Wednesday to pick them up at the front desk.  Patient stated she understood.

## 2019-04-07 NOTE — Patient Instructions (Signed)

## 2019-04-11 DIAGNOSIS — M25671 Stiffness of right ankle, not elsewhere classified: Secondary | ICD-10-CM | POA: Diagnosis not present

## 2019-04-11 DIAGNOSIS — M62561 Muscle wasting and atrophy, not elsewhere classified, right lower leg: Secondary | ICD-10-CM | POA: Diagnosis not present

## 2019-04-11 DIAGNOSIS — M25674 Stiffness of right foot, not elsewhere classified: Secondary | ICD-10-CM | POA: Diagnosis not present

## 2019-04-11 DIAGNOSIS — R269 Unspecified abnormalities of gait and mobility: Secondary | ICD-10-CM | POA: Diagnosis not present

## 2019-04-13 DIAGNOSIS — M25671 Stiffness of right ankle, not elsewhere classified: Secondary | ICD-10-CM | POA: Diagnosis not present

## 2019-04-13 DIAGNOSIS — M62561 Muscle wasting and atrophy, not elsewhere classified, right lower leg: Secondary | ICD-10-CM | POA: Diagnosis not present

## 2019-04-13 DIAGNOSIS — M25674 Stiffness of right foot, not elsewhere classified: Secondary | ICD-10-CM | POA: Diagnosis not present

## 2019-04-13 DIAGNOSIS — R269 Unspecified abnormalities of gait and mobility: Secondary | ICD-10-CM | POA: Diagnosis not present

## 2019-04-16 NOTE — Progress Notes (Signed)
Subjective: Valerie Dennis is a 34 y.o. is seen today in office s/p right cheilectomy and 2nd PIPJ arthroplasty.  She states that overall the toe still feels some swelling she is having some discomfort with walking.  She does feel that she is making improvements however.  She presents to pick up orthotics.  She is also been doing physical therapy which is been helpful.  Denies any fevers, chills, nausea, vomiting. No calf pain, chest pain, shortness of breath.   Objective: General: No acute distress, AAOx3 - presents wearing a regular shoe.  DP/PT pulses palpable 2/4, CRT < 3 sec to all digits.  Protective sensation intact. Motor function intact.  RIGHT foot: Incisions are well-healed.  There is decreased edema to the area.  There is no erythema or warmth.  MPJ range of motion is improved.  The toes are in rectus position.  Equinus is improved as well with physical therapy.  No other areas of discomfort identified at this time. No other open lesions or pre-ulcerative lesions.  No pain with calf compression, swelling, warmth, erythema.   Assessment and Plan:  Status post right foot surgery, doing well with no complications   -Treatment options discussed including all alternatives, risks, and complications -Continue physical therapy.  Continue range of motion exercises at home.  She is back to regular shoe.  Orthotics were dispensed today.  Break-in instructions were discussed.  Continue ice, compression anklet.  Return in about 4 weeks (around 05/05/2019).  Vivi Barrack DPM

## 2019-04-18 DIAGNOSIS — M25674 Stiffness of right foot, not elsewhere classified: Secondary | ICD-10-CM | POA: Diagnosis not present

## 2019-04-18 DIAGNOSIS — R269 Unspecified abnormalities of gait and mobility: Secondary | ICD-10-CM | POA: Diagnosis not present

## 2019-04-18 DIAGNOSIS — M25671 Stiffness of right ankle, not elsewhere classified: Secondary | ICD-10-CM | POA: Diagnosis not present

## 2019-04-18 DIAGNOSIS — Z0000 Encounter for general adult medical examination without abnormal findings: Secondary | ICD-10-CM | POA: Diagnosis not present

## 2019-04-18 DIAGNOSIS — M62561 Muscle wasting and atrophy, not elsewhere classified, right lower leg: Secondary | ICD-10-CM | POA: Diagnosis not present

## 2019-04-20 DIAGNOSIS — M62561 Muscle wasting and atrophy, not elsewhere classified, right lower leg: Secondary | ICD-10-CM | POA: Diagnosis not present

## 2019-04-20 DIAGNOSIS — M25674 Stiffness of right foot, not elsewhere classified: Secondary | ICD-10-CM | POA: Diagnosis not present

## 2019-04-20 DIAGNOSIS — M25671 Stiffness of right ankle, not elsewhere classified: Secondary | ICD-10-CM | POA: Diagnosis not present

## 2019-04-20 DIAGNOSIS — R269 Unspecified abnormalities of gait and mobility: Secondary | ICD-10-CM | POA: Diagnosis not present

## 2019-04-24 DIAGNOSIS — M62561 Muscle wasting and atrophy, not elsewhere classified, right lower leg: Secondary | ICD-10-CM | POA: Diagnosis not present

## 2019-04-24 DIAGNOSIS — R269 Unspecified abnormalities of gait and mobility: Secondary | ICD-10-CM | POA: Diagnosis not present

## 2019-04-24 DIAGNOSIS — M25674 Stiffness of right foot, not elsewhere classified: Secondary | ICD-10-CM | POA: Diagnosis not present

## 2019-04-24 DIAGNOSIS — M25671 Stiffness of right ankle, not elsewhere classified: Secondary | ICD-10-CM | POA: Diagnosis not present

## 2019-05-04 DIAGNOSIS — M25674 Stiffness of right foot, not elsewhere classified: Secondary | ICD-10-CM | POA: Diagnosis not present

## 2019-05-04 DIAGNOSIS — M25671 Stiffness of right ankle, not elsewhere classified: Secondary | ICD-10-CM | POA: Diagnosis not present

## 2019-05-04 DIAGNOSIS — R269 Unspecified abnormalities of gait and mobility: Secondary | ICD-10-CM | POA: Diagnosis not present

## 2019-05-04 DIAGNOSIS — M62561 Muscle wasting and atrophy, not elsewhere classified, right lower leg: Secondary | ICD-10-CM | POA: Diagnosis not present

## 2019-05-08 DIAGNOSIS — M62561 Muscle wasting and atrophy, not elsewhere classified, right lower leg: Secondary | ICD-10-CM | POA: Diagnosis not present

## 2019-05-08 DIAGNOSIS — M25671 Stiffness of right ankle, not elsewhere classified: Secondary | ICD-10-CM | POA: Diagnosis not present

## 2019-05-08 DIAGNOSIS — R269 Unspecified abnormalities of gait and mobility: Secondary | ICD-10-CM | POA: Diagnosis not present

## 2019-05-08 DIAGNOSIS — M25674 Stiffness of right foot, not elsewhere classified: Secondary | ICD-10-CM | POA: Diagnosis not present

## 2019-05-18 DIAGNOSIS — M62561 Muscle wasting and atrophy, not elsewhere classified, right lower leg: Secondary | ICD-10-CM | POA: Diagnosis not present

## 2019-05-18 DIAGNOSIS — R269 Unspecified abnormalities of gait and mobility: Secondary | ICD-10-CM | POA: Diagnosis not present

## 2019-05-18 DIAGNOSIS — M25674 Stiffness of right foot, not elsewhere classified: Secondary | ICD-10-CM | POA: Diagnosis not present

## 2019-05-18 DIAGNOSIS — M25671 Stiffness of right ankle, not elsewhere classified: Secondary | ICD-10-CM | POA: Diagnosis not present

## 2019-05-19 ENCOUNTER — Encounter: Payer: Self-pay | Admitting: Podiatry

## 2019-05-19 ENCOUNTER — Ambulatory Visit (INDEPENDENT_AMBULATORY_CARE_PROVIDER_SITE_OTHER): Payer: Federal, State, Local not specified - PPO | Admitting: Podiatry

## 2019-05-19 ENCOUNTER — Other Ambulatory Visit: Payer: Self-pay

## 2019-05-19 VITALS — Temp 97.9°F

## 2019-05-19 DIAGNOSIS — M2041 Other hammer toe(s) (acquired), right foot: Secondary | ICD-10-CM

## 2019-05-19 DIAGNOSIS — M216X1 Other acquired deformities of right foot: Secondary | ICD-10-CM

## 2019-05-19 DIAGNOSIS — M205X1 Other deformities of toe(s) (acquired), right foot: Secondary | ICD-10-CM

## 2019-05-19 NOTE — Progress Notes (Signed)
Subjective: Valerie Dennis is a 34 y.o. is seen today in office s/p right cheilectomy and 2nd PIPJ arthroplasty. She states that overall the foot is feeling better than last visit. Still has some swelling but she is also getting this on both feet so she is not sure if this is from the surgery. Most of her pain is when she does a lot of walking. No recent injury or falls. Still doing PT. No open lesions. Denies any fevers, chills, nausea, vomiting. No calf pain, chest pain, shortness of breath.   Last A1c: 6.3 10/2018 Last blood sugar: 96 this morning  Objective: General: No acute distress, AAOx3 - presents wearing a regular shoe.  DP/PT pulses palpable 2/4, CRT < 3 sec to all digits.  Protective sensation intact. Motor function intact.  RIGHT foot: Incisions are well-healed. Mild edema to the right first MPJ but improved compared to last appointment.  There is no erythema or warmth.  There is still some restriction of first MPJ range of motion on the right side.  Adequate range of motion of the left side.  Toes are rectus position.  No erythema or warmth.  Equinus still evident. No other open lesions or pre-ulcerative lesions.  No pain with calf compression, swelling, warmth, erythema.   Assessment and Plan:   Status post right foot surgery, improving  -Treatment options discussed including all alternatives, risks, and complications -She still feels that she is making improvements and over the last week she is actually been noticing improved motion to her toe as well as improved discomfort.  She still use the orthotics and using it every other day.  Majority of her discomfort at the end of the day.  Already continue physical therapy and continue to break in the orthotics.  If she needs adjustment she is going to let me know.  Dispensed night splint.  I will discussion about future options.  Will consider steroid injection next appointment if needed.  Also she asked for an MRI.  I would like to  wait at least 6 months surgery to evaluate the tendon on MRI.  RTC 4 weeks  Vivi Barrack DPM

## 2019-05-22 DIAGNOSIS — R269 Unspecified abnormalities of gait and mobility: Secondary | ICD-10-CM | POA: Diagnosis not present

## 2019-05-22 DIAGNOSIS — M25674 Stiffness of right foot, not elsewhere classified: Secondary | ICD-10-CM | POA: Diagnosis not present

## 2019-05-22 DIAGNOSIS — M25671 Stiffness of right ankle, not elsewhere classified: Secondary | ICD-10-CM | POA: Diagnosis not present

## 2019-05-22 DIAGNOSIS — M62561 Muscle wasting and atrophy, not elsewhere classified, right lower leg: Secondary | ICD-10-CM | POA: Diagnosis not present

## 2019-05-25 DIAGNOSIS — M25674 Stiffness of right foot, not elsewhere classified: Secondary | ICD-10-CM | POA: Diagnosis not present

## 2019-05-25 DIAGNOSIS — M62561 Muscle wasting and atrophy, not elsewhere classified, right lower leg: Secondary | ICD-10-CM | POA: Diagnosis not present

## 2019-05-25 DIAGNOSIS — R269 Unspecified abnormalities of gait and mobility: Secondary | ICD-10-CM | POA: Diagnosis not present

## 2019-05-25 DIAGNOSIS — M25671 Stiffness of right ankle, not elsewhere classified: Secondary | ICD-10-CM | POA: Diagnosis not present

## 2019-05-30 DIAGNOSIS — E7800 Pure hypercholesterolemia, unspecified: Secondary | ICD-10-CM | POA: Diagnosis not present

## 2019-05-30 DIAGNOSIS — Z8659 Personal history of other mental and behavioral disorders: Secondary | ICD-10-CM | POA: Diagnosis not present

## 2019-05-30 DIAGNOSIS — Z79899 Other long term (current) drug therapy: Secondary | ICD-10-CM | POA: Diagnosis not present

## 2019-05-30 DIAGNOSIS — E1065 Type 1 diabetes mellitus with hyperglycemia: Secondary | ICD-10-CM | POA: Diagnosis not present

## 2019-05-30 DIAGNOSIS — E109 Type 1 diabetes mellitus without complications: Secondary | ICD-10-CM | POA: Diagnosis not present

## 2019-05-30 DIAGNOSIS — I10 Essential (primary) hypertension: Secondary | ICD-10-CM | POA: Diagnosis not present

## 2019-06-05 DIAGNOSIS — M25671 Stiffness of right ankle, not elsewhere classified: Secondary | ICD-10-CM | POA: Diagnosis not present

## 2019-06-05 DIAGNOSIS — M62561 Muscle wasting and atrophy, not elsewhere classified, right lower leg: Secondary | ICD-10-CM | POA: Diagnosis not present

## 2019-06-05 DIAGNOSIS — M25674 Stiffness of right foot, not elsewhere classified: Secondary | ICD-10-CM | POA: Diagnosis not present

## 2019-06-05 DIAGNOSIS — R269 Unspecified abnormalities of gait and mobility: Secondary | ICD-10-CM | POA: Diagnosis not present

## 2019-06-12 DIAGNOSIS — M62561 Muscle wasting and atrophy, not elsewhere classified, right lower leg: Secondary | ICD-10-CM | POA: Diagnosis not present

## 2019-06-12 DIAGNOSIS — R269 Unspecified abnormalities of gait and mobility: Secondary | ICD-10-CM | POA: Diagnosis not present

## 2019-06-12 DIAGNOSIS — M25671 Stiffness of right ankle, not elsewhere classified: Secondary | ICD-10-CM | POA: Diagnosis not present

## 2019-06-12 DIAGNOSIS — M25674 Stiffness of right foot, not elsewhere classified: Secondary | ICD-10-CM | POA: Diagnosis not present

## 2019-06-15 DIAGNOSIS — Z794 Long term (current) use of insulin: Secondary | ICD-10-CM | POA: Diagnosis not present

## 2019-06-15 DIAGNOSIS — H40023 Open angle with borderline findings, high risk, bilateral: Secondary | ICD-10-CM | POA: Diagnosis not present

## 2019-06-15 DIAGNOSIS — E103293 Type 1 diabetes mellitus with mild nonproliferative diabetic retinopathy without macular edema, bilateral: Secondary | ICD-10-CM | POA: Diagnosis not present

## 2019-06-15 DIAGNOSIS — H35043 Retinal micro-aneurysms, unspecified, bilateral: Secondary | ICD-10-CM | POA: Diagnosis not present

## 2019-06-16 ENCOUNTER — Ambulatory Visit: Payer: Federal, State, Local not specified - PPO | Admitting: Podiatry

## 2019-06-29 DIAGNOSIS — R269 Unspecified abnormalities of gait and mobility: Secondary | ICD-10-CM | POA: Diagnosis not present

## 2019-06-29 DIAGNOSIS — M25671 Stiffness of right ankle, not elsewhere classified: Secondary | ICD-10-CM | POA: Diagnosis not present

## 2019-06-29 DIAGNOSIS — M62561 Muscle wasting and atrophy, not elsewhere classified, right lower leg: Secondary | ICD-10-CM | POA: Diagnosis not present

## 2019-06-29 DIAGNOSIS — M25674 Stiffness of right foot, not elsewhere classified: Secondary | ICD-10-CM | POA: Diagnosis not present

## 2019-07-05 DIAGNOSIS — M25561 Pain in right knee: Secondary | ICD-10-CM | POA: Diagnosis not present

## 2019-07-05 DIAGNOSIS — F909 Attention-deficit hyperactivity disorder, unspecified type: Secondary | ICD-10-CM | POA: Diagnosis not present

## 2019-07-05 DIAGNOSIS — M545 Low back pain: Secondary | ICD-10-CM | POA: Diagnosis not present

## 2019-07-05 DIAGNOSIS — I10 Essential (primary) hypertension: Secondary | ICD-10-CM | POA: Diagnosis not present

## 2019-07-13 DIAGNOSIS — M25671 Stiffness of right ankle, not elsewhere classified: Secondary | ICD-10-CM | POA: Diagnosis not present

## 2019-07-13 DIAGNOSIS — M62561 Muscle wasting and atrophy, not elsewhere classified, right lower leg: Secondary | ICD-10-CM | POA: Diagnosis not present

## 2019-07-13 DIAGNOSIS — M25674 Stiffness of right foot, not elsewhere classified: Secondary | ICD-10-CM | POA: Diagnosis not present

## 2019-07-13 DIAGNOSIS — R269 Unspecified abnormalities of gait and mobility: Secondary | ICD-10-CM | POA: Diagnosis not present

## 2019-07-20 DIAGNOSIS — R269 Unspecified abnormalities of gait and mobility: Secondary | ICD-10-CM | POA: Diagnosis not present

## 2019-07-20 DIAGNOSIS — M62561 Muscle wasting and atrophy, not elsewhere classified, right lower leg: Secondary | ICD-10-CM | POA: Diagnosis not present

## 2019-07-20 DIAGNOSIS — M25674 Stiffness of right foot, not elsewhere classified: Secondary | ICD-10-CM | POA: Diagnosis not present

## 2019-07-20 DIAGNOSIS — M25671 Stiffness of right ankle, not elsewhere classified: Secondary | ICD-10-CM | POA: Diagnosis not present

## 2019-07-26 DIAGNOSIS — M25461 Effusion, right knee: Secondary | ICD-10-CM | POA: Diagnosis not present

## 2019-07-31 DIAGNOSIS — R269 Unspecified abnormalities of gait and mobility: Secondary | ICD-10-CM | POA: Diagnosis not present

## 2019-07-31 DIAGNOSIS — M25674 Stiffness of right foot, not elsewhere classified: Secondary | ICD-10-CM | POA: Diagnosis not present

## 2019-07-31 DIAGNOSIS — M25671 Stiffness of right ankle, not elsewhere classified: Secondary | ICD-10-CM | POA: Diagnosis not present

## 2019-07-31 DIAGNOSIS — M62561 Muscle wasting and atrophy, not elsewhere classified, right lower leg: Secondary | ICD-10-CM | POA: Diagnosis not present

## 2019-08-04 ENCOUNTER — Ambulatory Visit (INDEPENDENT_AMBULATORY_CARE_PROVIDER_SITE_OTHER): Payer: Federal, State, Local not specified - PPO | Admitting: Podiatry

## 2019-08-04 ENCOUNTER — Encounter: Payer: Self-pay | Admitting: Podiatry

## 2019-08-04 ENCOUNTER — Other Ambulatory Visit: Payer: Self-pay

## 2019-08-04 DIAGNOSIS — M205X1 Other deformities of toe(s) (acquired), right foot: Secondary | ICD-10-CM

## 2019-08-04 DIAGNOSIS — T148XXA Other injury of unspecified body region, initial encounter: Secondary | ICD-10-CM | POA: Diagnosis not present

## 2019-08-04 DIAGNOSIS — M216X1 Other acquired deformities of right foot: Secondary | ICD-10-CM

## 2019-08-05 NOTE — Progress Notes (Signed)
Subjective: Valerie Dennis is a 34 y.o. is seen today in office s/p right cheilectomy and 2nd PIPJ arthroplasty.  Overall she states that she is having discomfort is about the same compared to last appointment.  She is on her feet a lot she is working also going to school and she is doing physical therapy.  She is concerned that maybe the calf raises other activities may be doing too much stress on the big toe.  Still with some swelling.  Also some mild discomfort in the left side.  No recent injury or falls.  Objective: General: No acute distress, AAOx3 - presents wearing a regular shoe.  DP/PT pulses palpable 2/4, CRT < 3 sec to all digits.  Protective sensation intact. Motor function intact.  RIGHT foot: Incisions are well-healed.  Mild swelling of the first MPJ on the right side there is no erythema or warmth.  Mild tenderness on the dorsal first MPJ as well as to the sesamoid complex plantarly.  There is no significant restriction with MPJ range of motion. LEFT foot: Minimal discomfort in this area there is no significant edema, erythema. Equinus still evident bilaterally No other open lesions or pre-ulcerative lesions.  No pain with calf compression, swelling, warmth, erythema.   Assessment and Plan:   Status post right foot surgery, improving  -Treatment options discussed including all alternatives, risks, and complications -I did modify her concerns to the further make a deeper first ray cut out for her.  She did well with this initially before surgery and hopefully this will take small pressure of the first MPJs.  I do want to continue physical therapy but would do more manual therapy to help with equinus as well as first MPJ range of motion.  Continue to ice at the end of the day and elevate.  This point is to hold off on an MRI.  I will see her back next appointment consider either steroid injection in the first MPJ dorsally or MRI if still concerned that tendon issues.  Vivi Barrack DPM  Cc: [facility], [employer]

## 2019-08-07 DIAGNOSIS — M62561 Muscle wasting and atrophy, not elsewhere classified, right lower leg: Secondary | ICD-10-CM | POA: Diagnosis not present

## 2019-08-07 DIAGNOSIS — R269 Unspecified abnormalities of gait and mobility: Secondary | ICD-10-CM | POA: Diagnosis not present

## 2019-08-07 DIAGNOSIS — M25674 Stiffness of right foot, not elsewhere classified: Secondary | ICD-10-CM | POA: Diagnosis not present

## 2019-08-07 DIAGNOSIS — M25671 Stiffness of right ankle, not elsewhere classified: Secondary | ICD-10-CM | POA: Diagnosis not present

## 2019-08-30 DIAGNOSIS — M25674 Stiffness of right foot, not elsewhere classified: Secondary | ICD-10-CM | POA: Diagnosis not present

## 2019-08-30 DIAGNOSIS — R269 Unspecified abnormalities of gait and mobility: Secondary | ICD-10-CM | POA: Diagnosis not present

## 2019-08-30 DIAGNOSIS — M62561 Muscle wasting and atrophy, not elsewhere classified, right lower leg: Secondary | ICD-10-CM | POA: Diagnosis not present

## 2019-08-30 DIAGNOSIS — M25671 Stiffness of right ankle, not elsewhere classified: Secondary | ICD-10-CM | POA: Diagnosis not present

## 2019-09-06 DIAGNOSIS — R269 Unspecified abnormalities of gait and mobility: Secondary | ICD-10-CM | POA: Diagnosis not present

## 2019-09-06 DIAGNOSIS — M25671 Stiffness of right ankle, not elsewhere classified: Secondary | ICD-10-CM | POA: Diagnosis not present

## 2019-09-06 DIAGNOSIS — M25674 Stiffness of right foot, not elsewhere classified: Secondary | ICD-10-CM | POA: Diagnosis not present

## 2019-09-06 DIAGNOSIS — M62561 Muscle wasting and atrophy, not elsewhere classified, right lower leg: Secondary | ICD-10-CM | POA: Diagnosis not present

## 2019-09-20 DIAGNOSIS — M62561 Muscle wasting and atrophy, not elsewhere classified, right lower leg: Secondary | ICD-10-CM | POA: Diagnosis not present

## 2019-09-20 DIAGNOSIS — R269 Unspecified abnormalities of gait and mobility: Secondary | ICD-10-CM | POA: Diagnosis not present

## 2019-09-20 DIAGNOSIS — M25671 Stiffness of right ankle, not elsewhere classified: Secondary | ICD-10-CM | POA: Diagnosis not present

## 2019-09-20 DIAGNOSIS — M25674 Stiffness of right foot, not elsewhere classified: Secondary | ICD-10-CM | POA: Diagnosis not present

## 2019-10-02 DIAGNOSIS — M25674 Stiffness of right foot, not elsewhere classified: Secondary | ICD-10-CM | POA: Diagnosis not present

## 2019-10-02 DIAGNOSIS — R269 Unspecified abnormalities of gait and mobility: Secondary | ICD-10-CM | POA: Diagnosis not present

## 2019-10-02 DIAGNOSIS — M25671 Stiffness of right ankle, not elsewhere classified: Secondary | ICD-10-CM | POA: Diagnosis not present

## 2019-10-02 DIAGNOSIS — M62561 Muscle wasting and atrophy, not elsewhere classified, right lower leg: Secondary | ICD-10-CM | POA: Diagnosis not present

## 2019-10-06 ENCOUNTER — Ambulatory Visit (INDEPENDENT_AMBULATORY_CARE_PROVIDER_SITE_OTHER): Payer: Federal, State, Local not specified - PPO | Admitting: Podiatry

## 2019-10-06 ENCOUNTER — Other Ambulatory Visit: Payer: Self-pay

## 2019-10-06 ENCOUNTER — Encounter: Payer: Self-pay | Admitting: Podiatry

## 2019-10-06 VITALS — Temp 97.3°F

## 2019-10-06 DIAGNOSIS — M205X1 Other deformities of toe(s) (acquired), right foot: Secondary | ICD-10-CM | POA: Diagnosis not present

## 2019-10-06 DIAGNOSIS — T148XXA Other injury of unspecified body region, initial encounter: Secondary | ICD-10-CM | POA: Diagnosis not present

## 2019-10-06 DIAGNOSIS — M216X1 Other acquired deformities of right foot: Secondary | ICD-10-CM | POA: Diagnosis not present

## 2019-10-06 NOTE — Progress Notes (Signed)
Subjective: Valerie Dennis is a 34 y.o. is seen today in office for follow up of right foot pain. She previously underwent right foot cheilectomy and 2nd PIPJ arthroplasty. She did not want to proceed with a gastroc recession at that time. Surgery was on 01/11/2019. Overall she states that she is doing about the same. She does feel that the surgery was helpful however he still having some discomfort mostly along the flexor tendon submetatarsal 1.  She has the same discomfort in the left side but not as significant as the right.  She also felt that she has plateaued with physical therapy.   Objective: General: No acute distress, AAOx3 - presents wearing a regular shoe.  DP/PT pulses palpable 2/4, CRT < 3 sec to all digits.  Protective sensation intact. Motor function intact.  RIGHT foot: Incisions are well-healed.  Swelling of the first MPJ appears to be improved.  There is still tenderness submetatarsal 1 on the sesamoid complex and going to the plantar aspect of the hallux.  The tendon appears to be intact.  She is able to passively and actively put the hallux and the MPJ through range of motion.  There is restriction in dorsiflexion and range of motion with minimal discomfort. Left foot: Similar discomfort submetatarsal 1 on the flexor tendon but tendon appears to be intact.  No edema there is no erythema or warmth. Equinus still evident bilaterally No other open lesions or pre-ulcerative lesions.  No pain with calf compression, swelling, warmth, erythema.   Assessment and Plan:   Continue pain and rule out flexor tendon tear right foot  -Treatment options discussed including all alternatives, risks, and complications -At this point discussion regards to treatment options.  We will repeat the MRI of the right foot to see if the flexor tendon has healed.  This was ordered today.  For now continue supportive shoes, inserts.  She states that last week she was not working her foot felt great however  unfortunate she is on her feet all day at work.  Also she is plateaued with physical therapy wound is continuous.  No follow-ups on file.  Vivi Barrack DPM

## 2019-10-12 DIAGNOSIS — E103293 Type 1 diabetes mellitus with mild nonproliferative diabetic retinopathy without macular edema, bilateral: Secondary | ICD-10-CM | POA: Diagnosis not present

## 2019-10-12 DIAGNOSIS — H40053 Ocular hypertension, bilateral: Secondary | ICD-10-CM | POA: Diagnosis not present

## 2019-10-12 DIAGNOSIS — H40023 Open angle with borderline findings, high risk, bilateral: Secondary | ICD-10-CM | POA: Diagnosis not present

## 2019-10-12 DIAGNOSIS — Z794 Long term (current) use of insulin: Secondary | ICD-10-CM | POA: Diagnosis not present

## 2019-10-24 ENCOUNTER — Ambulatory Visit (INDEPENDENT_AMBULATORY_CARE_PROVIDER_SITE_OTHER): Payer: Federal, State, Local not specified - PPO

## 2019-10-24 ENCOUNTER — Other Ambulatory Visit: Payer: Self-pay

## 2019-10-24 DIAGNOSIS — G8929 Other chronic pain: Secondary | ICD-10-CM | POA: Diagnosis not present

## 2019-10-24 DIAGNOSIS — T148XXA Other injury of unspecified body region, initial encounter: Secondary | ICD-10-CM

## 2019-10-24 DIAGNOSIS — M19071 Primary osteoarthritis, right ankle and foot: Secondary | ICD-10-CM | POA: Diagnosis not present

## 2019-10-24 DIAGNOSIS — M65871 Other synovitis and tenosynovitis, right ankle and foot: Secondary | ICD-10-CM | POA: Diagnosis not present

## 2019-11-07 DIAGNOSIS — G8929 Other chronic pain: Secondary | ICD-10-CM | POA: Diagnosis not present

## 2019-11-07 DIAGNOSIS — Z1589 Genetic susceptibility to other disease: Secondary | ICD-10-CM | POA: Diagnosis not present

## 2019-11-07 DIAGNOSIS — M545 Low back pain: Secondary | ICD-10-CM | POA: Diagnosis not present

## 2019-11-17 DIAGNOSIS — Z1589 Genetic susceptibility to other disease: Secondary | ICD-10-CM | POA: Diagnosis not present

## 2019-11-17 DIAGNOSIS — M7061 Trochanteric bursitis, right hip: Secondary | ICD-10-CM | POA: Diagnosis not present

## 2019-11-17 DIAGNOSIS — Y939 Activity, unspecified: Secondary | ICD-10-CM | POA: Diagnosis not present

## 2019-11-17 DIAGNOSIS — M7601 Gluteal tendinitis, right hip: Secondary | ICD-10-CM | POA: Diagnosis not present

## 2019-11-17 DIAGNOSIS — M7062 Trochanteric bursitis, left hip: Secondary | ICD-10-CM | POA: Diagnosis not present

## 2019-11-17 DIAGNOSIS — M545 Low back pain, unspecified: Secondary | ICD-10-CM | POA: Diagnosis not present

## 2019-11-17 DIAGNOSIS — G8929 Other chronic pain: Secondary | ICD-10-CM | POA: Diagnosis not present

## 2019-11-17 DIAGNOSIS — Z8261 Family history of arthritis: Secondary | ICD-10-CM | POA: Diagnosis not present

## 2019-12-05 DIAGNOSIS — Z8659 Personal history of other mental and behavioral disorders: Secondary | ICD-10-CM | POA: Diagnosis not present

## 2019-12-05 DIAGNOSIS — E7800 Pure hypercholesterolemia, unspecified: Secondary | ICD-10-CM | POA: Diagnosis not present

## 2019-12-05 DIAGNOSIS — E1065 Type 1 diabetes mellitus with hyperglycemia: Secondary | ICD-10-CM | POA: Diagnosis not present

## 2019-12-05 DIAGNOSIS — Z79899 Other long term (current) drug therapy: Secondary | ICD-10-CM | POA: Diagnosis not present

## 2019-12-05 DIAGNOSIS — Z9641 Presence of insulin pump (external) (internal): Secondary | ICD-10-CM | POA: Diagnosis not present

## 2019-12-05 DIAGNOSIS — Z794 Long term (current) use of insulin: Secondary | ICD-10-CM | POA: Diagnosis not present

## 2019-12-05 DIAGNOSIS — I10 Essential (primary) hypertension: Secondary | ICD-10-CM | POA: Diagnosis not present

## 2019-12-21 ENCOUNTER — Ambulatory Visit: Payer: Federal, State, Local not specified - PPO | Admitting: Orthopaedic Surgery

## 2019-12-21 ENCOUNTER — Encounter: Payer: Self-pay | Admitting: Orthopaedic Surgery

## 2019-12-21 DIAGNOSIS — M25561 Pain in right knee: Secondary | ICD-10-CM | POA: Diagnosis not present

## 2019-12-21 DIAGNOSIS — G8929 Other chronic pain: Secondary | ICD-10-CM

## 2019-12-21 NOTE — Progress Notes (Signed)
Office Visit Note   Patient: Valerie Dennis           Date of Birth: 12/09/1985           MRN: [id number] Visit Date: 12/21/2019              Requested by: Ebbie Latus, MD [facility] [place],  [place] [address] PCP: Ebbie Latus, MD   Assessment & Plan: Visit Diagnoses:  1. Chronic pain of right knee     Plan: Impression is chronic right knee pain that I feel is most likely patellofemoral dysfunction however patient has been in pain for a year and would like reassurance that there are no structural abnormalities therefore we will obtain MRI.  We also discussed the importance of weight loss and strengthening with physical therapy to help with patellofemoral pain.  She will follow up with [place] once those are completed. Call with concerns or questions.  Total face to face encounter time was greater than 25 minutes and over half of this time was spent in counseling and/or coordination of care.  Follow-Up Instructions: Return for after MRI.   Orders:  No orders of the defined types were placed in this encounter.  No orders of the defined types were placed in this encounter.     Procedures: No procedures performed   Clinical Data: No additional findings.   Subjective: Chief Complaint  Patient presents with  . Right Knee - Pain    HPI patient is a pleasant 34 year old female who comes in today with right knee pain for the past year.  No known injury or change in activity.  She first noticed popping in her knee back in 2018 but this was without pain.  About a year ago is when she noticed the pain.  She thinks this may have been aggravated following surgery to her right foot.  The pain she has is to the entire knee.  She has associated stiffness.  No locking or catching.  She has increased pain going from sitting to standing as well as walking and going up and down stairs as well as when she is participating in aerobics.  She does not take anything for pain.  She  has seen her primary care provider where she was sent for x-rays.  We do not have the images for these.  Review of Systems as detailed in HPI.  All others reviewed and are negative.   Objective: Vital Signs: There were no vitals taken for this visit.  Physical Exam well-developed well-nourished female no acute distress.  Alert oriented x3.  Ortho Exam right knee exam shows trace effusion.  Range of motion 0 to 120 degrees.  Valgus alignment of the knee.  Mild patellofemoral crepitus.  Ligaments are stable.  She is neurovascular intact distally.  Specialty Comments:  No specialty comments available.  Imaging: No new imaging   PMFS History: Patient Active Problem List   Diagnosis Date Noted  . Hallux limitus of right foot 03/27/2019  . Hammer toe of right foot 03/27/2019  . Acquired equinus deformity of right foot 03/27/2019  . Insomnia 01/09/2019  . Pressure sensation in left ear 11/28/2018  . OAG (open angle glaucoma) suspect, high risk, bilateral 04/20/2018  . Retinal ischemia 04/20/2018  . Acne 08/21/2016  . Class 1 obesity due to excess calories with serious comorbidity and body mass index (BMI) of 32.0 to 32.9 in adult 06/29/2016  . History of severe pre-eclampsia 06/08/2016  . Suzette Battiest  tenosynovitis, right 05/20/2016  . Right carpal tunnel syndrome 05/20/2016  . Trigger finger, right index finger 05/20/2016  . Status post primary low transverse cesarean section 04/28/2016  . ADHD (attention deficit hyperactivity disorder) 03/04/2016  . 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 Hypertension 02/18/2015  . HLD (hyperlipidemia) 03/30/2014  . Type 1 diabetes mellitus (HCC) 03/14/2014  . Acute macular neuroretinitis of left eye 03/14/2014  . Hypertrophy of nasal turbinates 05/16/2013  . Iliopsoas bursitis 08/11/2012  . Other specified joint disorders, unspecified hip 08/11/2012  . Acetabular  labrum tear 08/11/2012  . Pain in joint, pelvic region and thigh 08/11/2012   Past Medical History:  Diagnosis Date  . Diabetes mellitus without complication (HCC)   . Hypertension     Family History  Problem Relation Age of Onset  . Hyperlipidemia Mother   . Rheum arthritis Mother   . Heart disease Father   . Hyperlipidemia Father   . Hypertension Father   . Stroke Father   . Hypertension Sister   . Heart disease Maternal Grandmother   . Heart disease Maternal Grandfather   . Heart disease Paternal Grandfather   . Hypertension Paternal Grandfather   . Diabetes Paternal Grandfather     Past Surgical History:  Procedure Laterality Date  . CAUTERY OF TURBINATES  2003  . CESAREAN SECTION N/A 04/24/2016   Procedure: CESAREAN SECTION;  Surgeon: Reva Bores, MD;  Location: [facility] BIRTHING SUITES;  Service: Obstetrics;  Laterality: N/A;  . DE QUERVAIN'S RELEASE  2016  . WISDOM TOOTH EXTRACTION     Social History   Occupational History  . Not on file  Tobacco Use  . Smoking status: Never Smoker  . Smokeless tobacco: Never Used  Vaping Use  . Vaping Use: Never used  Substance and Sexual Activity  . Alcohol use: Yes    Alcohol/week: 0.0 standard drinks    Comment: Socially before pregnant  . Drug use: No  . Sexual activity: Yes    Birth control/protection: None

## 2019-12-27 DIAGNOSIS — R768 Other specified abnormal immunological findings in serum: Secondary | ICD-10-CM | POA: Diagnosis not present

## 2019-12-27 DIAGNOSIS — M545 Low back pain, unspecified: Secondary | ICD-10-CM | POA: Diagnosis not present

## 2019-12-27 DIAGNOSIS — G8929 Other chronic pain: Secondary | ICD-10-CM | POA: Diagnosis not present

## 2019-12-27 DIAGNOSIS — Z8261 Family history of arthritis: Secondary | ICD-10-CM | POA: Diagnosis not present

## 2020-01-10 ENCOUNTER — Encounter: Payer: Self-pay | Admitting: Orthopaedic Surgery

## 2020-01-11 ENCOUNTER — Other Ambulatory Visit: Payer: Self-pay

## 2020-01-11 DIAGNOSIS — G8929 Other chronic pain: Secondary | ICD-10-CM

## 2020-01-11 NOTE — Telephone Encounter (Signed)
Teams chatted [place] with [employer], they will contact pt to scheudle

## 2020-01-17 DIAGNOSIS — E663 Overweight: Secondary | ICD-10-CM | POA: Diagnosis not present

## 2020-01-17 DIAGNOSIS — Z0000 Encounter for general adult medical examination without abnormal findings: Secondary | ICD-10-CM | POA: Diagnosis not present

## 2020-01-17 DIAGNOSIS — E103393 Type 1 diabetes mellitus with moderate nonproliferative diabetic retinopathy without macular edema, bilateral: Secondary | ICD-10-CM | POA: Diagnosis not present

## 2020-01-17 DIAGNOSIS — F909 Attention-deficit hyperactivity disorder, unspecified type: Secondary | ICD-10-CM | POA: Diagnosis not present

## 2020-01-17 DIAGNOSIS — I10 Essential (primary) hypertension: Secondary | ICD-10-CM | POA: Diagnosis not present

## 2020-02-19 ENCOUNTER — Other Ambulatory Visit: Payer: Federal, State, Local not specified - PPO

## 2020-02-26 ENCOUNTER — Other Ambulatory Visit: Payer: Federal, State, Local not specified - PPO

## 2020-03-04 ENCOUNTER — Other Ambulatory Visit: Payer: Self-pay

## 2020-03-04 ENCOUNTER — Ambulatory Visit (INDEPENDENT_AMBULATORY_CARE_PROVIDER_SITE_OTHER): Payer: Federal, State, Local not specified - PPO

## 2020-03-04 DIAGNOSIS — G8929 Other chronic pain: Secondary | ICD-10-CM

## 2020-03-04 DIAGNOSIS — S83241A Other tear of medial meniscus, current injury, right knee, initial encounter: Secondary | ICD-10-CM | POA: Diagnosis not present

## 2020-03-04 DIAGNOSIS — M25561 Pain in right knee: Secondary | ICD-10-CM

## 2020-03-05 NOTE — Progress Notes (Signed)
Needs appt

## 2020-03-07 ENCOUNTER — Telehealth: Payer: Self-pay

## 2020-03-07 NOTE — Telephone Encounter (Signed)
Called patient no answer LMOM. She needs a f/u appt to review MRI with Dr Roda Shutters.

## 2020-03-20 ENCOUNTER — Ambulatory Visit: Payer: Self-pay

## 2020-03-20 ENCOUNTER — Other Ambulatory Visit: Payer: Self-pay

## 2020-03-20 ENCOUNTER — Ambulatory Visit (INDEPENDENT_AMBULATORY_CARE_PROVIDER_SITE_OTHER): Payer: Federal, State, Local not specified - PPO | Admitting: Sports Medicine

## 2020-03-20 DIAGNOSIS — S83241S Other tear of medial meniscus, current injury, right knee, sequela: Secondary | ICD-10-CM

## 2020-03-20 MED ORDER — MELOXICAM 15 MG PO TABS: ORAL_TABLET | ORAL | 3 refills | Status: AC

## 2020-03-20 NOTE — Patient Instructions (Signed)
? ?Platelet-rich plasma is used in musculoskeletal medicine to focus your own body?s ability to ?heal. It has several well-done published randomized control trials (RCT) which demonstrate both ?its effectiveness and safety in many musculoskeletal conditions, including osteoarthritis, ?tendinopathies, and damaged vertebral discs. PRP has been in clinical use since the 1990?s. ?Many people know that platelets form a clot if there is a cut in the skin. It turns out that ?platelets do not only form a clot, they also start the body?s own repair process. When platelets ?activate to form a clot, they also release alpha granules which have hundreds of chemical ?messengers in them that initiate and organize repair to the damaged tissue. Precisely placing ?PRP into the site of injury will initiate the healing process by activating on the damaged ?cartilage or tendon. This is an inflammatory process, and inflammation is the vital first phase of ?Healing. ? ?What to expect and how to prepare for PRP ? ? 2 weeks prior to the procedure: depending on the procedure, you may need to arrange ?for a driver to bring you home. IF you are having a lower extremity procedure, we can provide crutches ?as needed. ? ? 7 days prior to the procedure: Stop taking anti-inflammatory drugs like ibuprofen, ?Naprosyn, Celebrex, or Meloxicam. Let Dr. Beauford Lando know if you have been taking prednisone or other ?corticosteroids in the last month. ? ? The day before the procedure: thoroughly shower and clean your skin.  ? ? The day of the procedure: Wear loose-fitting clothing like sweatpants or shorts. If you ?are having an upper body procedure wear a top that can button or zip up. ? ?PRP will initiate healing and a productive inflammation, and PRP therapy will make the body ?part treated sore for 4 days to two weeks. Anti-inflammatory drugs (i.e. ibuprofen, Naprosyn, ?Celebrex) and corticosteroids such as prednisone can blunt or stop this process,  so it is ?important to not take any anti-inflammatory drugs for 7 days before getting PRP therapy, or for ?at least three weeks after PRP therapy. Corticosteroid injections can blunt inflammation for 30 ?days, so let us know if you have had one recently. Depending on the body part injected, you ?may be in a sling or on crutches for several days. Just like wringing out a wet dishcloth, if you ?load or tense a tendon or ligament that has just been injected with PRP, some of the PRP ?injected will squish out. By keeping the body part treated relaxed by using a sling (for the ?shoulder or arm) or crutches (for hips and legs) for a few days, the PRP can bind in place and do ?its job.  ? ?You may need a driver to bring you home.  ?Tobacco/nicotine is a potent toxin and its use constricts small blood vessels which are needed for tissue repair.  ?Tobacco/nicotine use will limit the effectiveness of any treatment and stopping tobacco use is one of the single  ?greatest actions you can take to improve your health. Avoid toxins like alcohol, which inhibits and depresses the ?cells needed for tissue repair. ? ?What happens during the PRP procedure? ? ?Platelet rich plasma is made by taking some of your blood and performing a two-stage ?centrifuge process on it to concentrate the PRP. First, your blood is drawn into a syringe with a ?small amount of anti-coagulant in it (this is to keep the blood from clotting during this process). ?The amount of blood drawn is usually about 10-30 milliliters, depending on how much PRP is needed for   the treatment.  ?(There are 355 milliliters in a 12-ounce soda can for comparison).  ?Then the blood is transferred in a sterile fashion into a ?centrifuge tube. It is then centrifuged for the first cycle where the red blood cells are isolated ?and discarded. In the second centrifuge cycle, the platelet-rich fraction of the remaining plasma ?is concentrated and placed in a syringe. The skin at the  injection site is numbed with a small ?amount of topical cooling spray. Dr Shaelin Lalley will then precisely inject the PRP ?into the injury site using ultrasound guidance. ? ?What to do after your procedure ? ?I will give you specific medicine to control any discomfort you may have after the procedure. ?Avoid NSAIDs like ibuprofen. ?Acetaminophen can be used for mild pain.  ?Depending on the part of the body ?treated, usually you will be placed in a sling or on crutches for 1 to 3 days. Do your best not to ?tense or load the treated area during this time. After 3 days, unless otherwise instructed, the ?treated body part should be used and slowly moved through its full range of motion. It will be ?sore, but you will not be doing damage by moving it, in fact it needs to move to heal. If you ?were on crutches for a period of time, walking is ok once you are off the crutches. For now, ?avoid activities that specifically hurt you before being treated. Exercise is vital to good health ?and finding a way to cross train around your injury is important not only for your physical ?health, but for your mental health as well. Ask me about cross training options for your injury. ?Some brief (10 minutes or less) period of heat or ice therapy will not hurt the therapy, but it is ?not required. Usually, depending on the initial injury, physical therapy is started from two ?weeks to four weeks after injection. Improvements in pain and function should be expected ?from 8 weeks to 12 weeks after injection and some injuries may require more than one ?treatment.  ? ? ?___________________________________________ ?Muaaz Brau J. Harshil Cavallaro, M.D., ABFM., CAQSM. ?Primary Care and Sports Medicine ?[facility] ? ?Adjunct Professor of Family Medicine  ?[employer] ? ?

## 2020-03-20 NOTE — Assessment & Plan Note (Signed)
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 Exam is for the most part benign, she did have an MRI that showed a posterior medial meniscal tear. She does occasionally get popping and pain, and is unable to exercise and do her daily activities. We a long discussion about possible therapeutic options including observation, physical therapy, NSAIDs, bracing. We also discussed steroid injection and PRP. She had some questions about viscosupplementation, I explained that PRP was for the most part experimental for this type of injury, and that viscosupplementation would likely not be approved. We are going to proceed with meloxicam, steroid injection today, I will send her home with some rehabilitation exercises to do, and I would like to see her back in about a month. If insufficient improvement we will have another discussion about PRP but I really suspect that she would need to address the meniscus arthroscopically. Return to see me in a month.

## 2020-03-20 NOTE — Progress Notes (Signed)
    Procedures performed today:    Procedure: Real-time Ultrasound Guided injection of the right knee Device: Samsung HS60  Verbal informed consent obtained.  Time-out conducted.  Noted no overlying erythema, induration, or other signs of local infection.  Skin prepped in a sterile fashion.  Local anesthesia: Topical Ethyl chloride.  With sterile technique and under real time ultrasound guidance:  Noted minimal effusion, 1 cc Kenalog 40, 2 cc lidocaine, 2 cc bupivacaine injected easily Completed without difficulty  Advised to call if fevers/chills, erythema, induration, drainage, or persistent bleeding.  Images permanently stored and available for review in PACS.  Impression: Technically successful ultrasound guided injection.  Independent interpretation of notes and tests performed by another provider:   None.  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 Exam is for the most part benign, she did have an MRI that showed a posterior medial meniscal tear. She does occasionally get popping and pain, and is unable to exercise and do her daily activities. We a long discussion about possible therapeutic options including observation, physical therapy, NSAIDs, bracing. We also discussed steroid injection and PRP. She had some questions about viscosupplementation, I explained that PRP was for the most part experimental for this type of injury, and that viscosupplementation would likely not be approved. We are going to proceed with meloxicam, steroid injection today, I will send her home with some rehabilitation exercises to do, and I would like to see her back in about a month. If insufficient improvement we will have another discussion about PRP but I really suspect that she would  need to address the meniscus arthroscopically. Return to see me in a month.  I spent 40 minutes of total time managing this patient today, this includes chart review, face to face, and non-face to face time.  A lot of this was also spent explaining things to the patient, this was separate from the time spent performing the above procedure.   ___________________________________________ Ihor Austin. Benjamin Stain, M.D., ABFM., CAQSM. Primary Care and Sports Medicine [facility]  Adjunct Instructor of Family Medicine  [employer]

## 2020-04-05 DIAGNOSIS — S83241A Other tear of medial meniscus, current injury, right knee, initial encounter: Secondary | ICD-10-CM | POA: Diagnosis not present

## 2020-04-17 ENCOUNTER — Ambulatory Visit: Payer: Federal, State, Local not specified - PPO | Admitting: Sports Medicine

## 2020-06-05 DIAGNOSIS — H40023 Open angle with borderline findings, high risk, bilateral: Secondary | ICD-10-CM | POA: Diagnosis not present

## 2020-06-05 DIAGNOSIS — E103293 Type 1 diabetes mellitus with mild nonproliferative diabetic retinopathy without macular edema, bilateral: Secondary | ICD-10-CM | POA: Diagnosis not present

## 2020-06-05 DIAGNOSIS — Z794 Long term (current) use of insulin: Secondary | ICD-10-CM | POA: Diagnosis not present

## 2020-06-05 DIAGNOSIS — H40053 Ocular hypertension, bilateral: Secondary | ICD-10-CM | POA: Diagnosis not present

## 2020-07-05 ENCOUNTER — Encounter: Payer: Self-pay | Admitting: Podiatry

## 2020-07-05 ENCOUNTER — Ambulatory Visit (INDEPENDENT_AMBULATORY_CARE_PROVIDER_SITE_OTHER): Payer: Federal, State, Local not specified - PPO | Admitting: Podiatry

## 2020-07-05 ENCOUNTER — Other Ambulatory Visit: Payer: Self-pay

## 2020-07-05 DIAGNOSIS — M7751 Other enthesopathy of right foot: Secondary | ICD-10-CM

## 2020-07-05 DIAGNOSIS — M205X1 Other deformities of toe(s) (acquired), right foot: Secondary | ICD-10-CM | POA: Diagnosis not present

## 2020-07-05 MED ORDER — TRIAMCINOLONE ACETONIDE 10 MG/ML IJ SUSP: 10.0000 mg | Freq: Once | INTRAMUSCULAR | Status: AC

## 2020-07-09 NOTE — Progress Notes (Signed)
Subjective: 35 year old female presents the office today for concerns of pain in the right big toe joint asking for an injection.  She states that she gets discomfort at end range of motion.  Denies recent injury or trauma.  She states that she is concerned about the bone marrow edema that was read on MRI previously.  Occasional discomfort the left foot but not as significant as the right side.  She states that some weeks the surgery has been helpful and she does not get discomfort but she was previously but still having limitation of motion.  States that she was reading about a fusion she thinks at some point she is going to have this done but not ready for this.  Denies any systemic complaints such as fevers, chills, nausea, vomiting. No acute changes since last appointment, and no other complaints at this time.   Objective: AAO x3, NAD DP/PT pulses palpable bilaterally, CRT less than 3 seconds Tenderness at end range of motion right first MPJ with restriction in range of motion.  Decreased range of motion of first MPJ on the right side compared to the contralateral extremity.  No erythema or warmth.  No pain on the flexor tendons today.  MMT 5/5. No pain with calf compression, swelling, warmth, erythema  Assessment: Capsulitis right foot  Plan: -All treatment options discussed with the patient including all alternatives, risks, complications.  -Steroid injection performed.  Skin was prepped with Betadine and mixture of 1 cc, 10, 0.5 cc of Marcaine plain, 0.5 cc of lidocaine plain was infiltrated into around the first MPJ without any complications.  Postinjection care discussed. -We will plan on making another set of orthotics in the future. -Discussed surgical intervention if needed but were to wait on any further surgery for now. -Patient encouraged to call the office with any questions, concerns, change in symptoms.   Vivi Barrack DPM

## 2020-07-23 DIAGNOSIS — A499 Bacterial infection, unspecified: Secondary | ICD-10-CM | POA: Diagnosis not present

## 2020-07-23 DIAGNOSIS — J019 Acute sinusitis, unspecified: Secondary | ICD-10-CM | POA: Diagnosis not present

## 2020-07-30 DIAGNOSIS — Z7984 Long term (current) use of oral hypoglycemic drugs: Secondary | ICD-10-CM | POA: Diagnosis not present

## 2020-07-30 DIAGNOSIS — I10 Essential (primary) hypertension: Secondary | ICD-10-CM | POA: Diagnosis not present

## 2020-07-30 DIAGNOSIS — E1065 Type 1 diabetes mellitus with hyperglycemia: Secondary | ICD-10-CM | POA: Diagnosis not present

## 2020-07-30 DIAGNOSIS — Z79899 Other long term (current) drug therapy: Secondary | ICD-10-CM | POA: Diagnosis not present

## 2020-07-30 DIAGNOSIS — E109 Type 1 diabetes mellitus without complications: Secondary | ICD-10-CM | POA: Diagnosis not present

## 2020-07-30 DIAGNOSIS — E7800 Pure hypercholesterolemia, unspecified: Secondary | ICD-10-CM | POA: Diagnosis not present

## 2020-07-30 DIAGNOSIS — F909 Attention-deficit hyperactivity disorder, unspecified type: Secondary | ICD-10-CM | POA: Diagnosis not present

## 2020-08-07 DIAGNOSIS — F900 Attention-deficit hyperactivity disorder, predominantly inattentive type: Secondary | ICD-10-CM | POA: Diagnosis not present

## 2020-08-07 DIAGNOSIS — G8929 Other chronic pain: Secondary | ICD-10-CM | POA: Diagnosis not present

## 2020-08-07 DIAGNOSIS — R519 Headache, unspecified: Secondary | ICD-10-CM | POA: Diagnosis not present

## 2021-05-12 ENCOUNTER — Encounter: Payer: Self-pay | Admitting: Podiatry

## 2021-05-13 ENCOUNTER — Other Ambulatory Visit: Payer: Self-pay | Admitting: Podiatry

## 2021-05-13 DIAGNOSIS — M7751 Other enthesopathy of right foot: Secondary | ICD-10-CM

## 2021-05-13 DIAGNOSIS — M205X1 Other deformities of toe(s) (acquired), right foot: Secondary | ICD-10-CM

## 2021-05-14 ENCOUNTER — Ambulatory Visit (INDEPENDENT_AMBULATORY_CARE_PROVIDER_SITE_OTHER): Payer: Federal, State, Local not specified - PPO

## 2021-05-14 DIAGNOSIS — M7751 Other enthesopathy of right foot: Secondary | ICD-10-CM | POA: Diagnosis not present

## 2021-05-14 DIAGNOSIS — M205X1 Other deformities of toe(s) (acquired), right foot: Secondary | ICD-10-CM | POA: Diagnosis not present

## 2021-05-15 ENCOUNTER — Other Ambulatory Visit: Payer: Self-pay | Admitting: Podiatry

## 2021-05-15 DIAGNOSIS — M205X1 Other deformities of toe(s) (acquired), right foot: Secondary | ICD-10-CM

## 2021-05-15 DIAGNOSIS — M7751 Other enthesopathy of right foot: Secondary | ICD-10-CM

## 2021-05-15 NOTE — Progress Notes (Signed)
MRI ordered

## 2021-05-20 ENCOUNTER — Telehealth: Payer: Self-pay | Admitting: *Deleted

## 2021-05-20 NOTE — Telephone Encounter (Signed)
-----   Message from Trula Slade, DPM sent at 05/15/2021  5:43 PM EDT ----- ?I ordered a MRI of the right foot for [facility] if you can please follow up on this. Thanks! ? ?

## 2021-05-20 NOTE — Telephone Encounter (Signed)
Called imaging @[facility]  , spoke with Kathryne Sharper and they have called patient today and left a message to return call to schedule the appointment. ?

## 2021-05-20 NOTE — Telephone Encounter (Signed)
-----   Message from Matthew R Wagoner, DPM sent at 05/15/2021  5:43 PM EDT ----- ?I ordered a MRI of the right foot for [facility] if you can please follow up on this. Thanks! ? ?

## 2021-05-22 ENCOUNTER — Ambulatory Visit: Payer: Federal, State, Local not specified - PPO | Admitting: Podiatry

## 2021-05-27 ENCOUNTER — Ambulatory Visit: Payer: Federal, State, Local not specified - PPO | Admitting: Podiatry

## 2021-06-09 ENCOUNTER — Other Ambulatory Visit: Payer: Federal, State, Local not specified - PPO

## 2021-06-26 ENCOUNTER — Encounter: Payer: Self-pay | Admitting: Podiatry

## 2021-06-27 ENCOUNTER — Other Ambulatory Visit: Payer: Self-pay | Admitting: Podiatry

## 2021-06-27 DIAGNOSIS — M7751 Other enthesopathy of right foot: Secondary | ICD-10-CM

## 2021-06-27 DIAGNOSIS — M205X1 Other deformities of toe(s) (acquired), right foot: Secondary | ICD-10-CM

## 2021-06-27 NOTE — Telephone Encounter (Signed)
Can someone check to see if the MRI needs a pre-cert for the foot? Updated order placed and I have called the [facility]. Thanks.

## 2021-06-30 ENCOUNTER — Other Ambulatory Visit: Payer: Federal, State, Local not specified - PPO

## 2021-07-02 NOTE — Telephone Encounter (Signed)
Spoke w/ [facility] and patient has been reschedule for June 5th per her request.No prior authorization is required for this study-ref # [id number]

## 2021-07-14 ENCOUNTER — Ambulatory Visit (INDEPENDENT_AMBULATORY_CARE_PROVIDER_SITE_OTHER): Payer: Federal, State, Local not specified - PPO

## 2021-07-14 DIAGNOSIS — M205X1 Other deformities of toe(s) (acquired), right foot: Secondary | ICD-10-CM | POA: Diagnosis not present

## 2021-07-14 DIAGNOSIS — G8929 Other chronic pain: Secondary | ICD-10-CM

## 2021-07-14 DIAGNOSIS — M7751 Other enthesopathy of right foot: Secondary | ICD-10-CM

## 2021-07-14 DIAGNOSIS — M79671 Pain in right foot: Secondary | ICD-10-CM | POA: Diagnosis not present

## 2021-07-21 ENCOUNTER — Ambulatory Visit: Payer: Federal, State, Local not specified - PPO | Admitting: Podiatry

## 2021-07-21 ENCOUNTER — Ambulatory Visit: Payer: Federal, State, Local not specified - PPO

## 2021-07-21 ENCOUNTER — Ambulatory Visit (INDEPENDENT_AMBULATORY_CARE_PROVIDER_SITE_OTHER): Payer: Federal, State, Local not specified - PPO

## 2021-07-21 DIAGNOSIS — M205X1 Other deformities of toe(s) (acquired), right foot: Secondary | ICD-10-CM | POA: Diagnosis not present

## 2021-07-21 DIAGNOSIS — T148XXA Other injury of unspecified body region, initial encounter: Secondary | ICD-10-CM

## 2021-07-21 DIAGNOSIS — M216X1 Other acquired deformities of right foot: Secondary | ICD-10-CM

## 2021-07-21 DIAGNOSIS — M7751 Other enthesopathy of right foot: Secondary | ICD-10-CM

## 2021-07-21 DIAGNOSIS — M205X2 Other deformities of toe(s) (acquired), left foot: Secondary | ICD-10-CM

## 2021-07-21 NOTE — Progress Notes (Signed)
Patient realized she did not bring her previous orthotics to discuss how she wants them fabricated and would rather have me see them than be casted today. Patient rescheduled evaluation appointment for another day.

## 2021-07-28 NOTE — Progress Notes (Signed)
Subjective: 36 year old female presents the office today for proper evaluation of right foot pain.  She did have an MRI performed and she feels that she gets pain at end range of motion when she brings her big toe up.  It is painful for her.  Objective: AAO x3, NAD DP/PT pulses palpable bilaterally, CRT less than 3 seconds There is pain with range of motion of the first MPJ of the right foot.  There is decreased range of motion of the first lesion right side compared to the left side.  No specific tenderness.  Trace edema.  No erythema or warmth.  No other area of pinpoint tenderness.  No pain with calf compression, swelling, warmth, erythema  Assessment: Hallux limitus right side  Plan: -All treatment options discussed with the patient including all alternatives, risks, complications.  -Again reviewed the MRI with her and we also took a new set weightbearing x-rays.  3 views of the feet were obtained bilaterally for comparison.  Minimal arthritic changes present first MPJ on x-ray. -I do think her symptoms are more from an range of motion jamming.  There is also mild arthritis.  We reviewed the MRI.  After long discussion we will get a second opinion on the MRI.  Discussed different types of injections for the joint as well but not sure how well this we tolerated.  We will get the report of a repeat MRI before pursuing further treatment. -Patient encouraged to call the office with any questions, concerns, change in symptoms.   Vivi Barrack DPM

## 2021-07-30 ENCOUNTER — Other Ambulatory Visit: Payer: Federal, State, Local not specified - PPO

## 2021-11-07 ENCOUNTER — Encounter: Payer: Self-pay | Admitting: Podiatry

## 2021-11-13 ENCOUNTER — Encounter: Payer: Self-pay | Admitting: Podiatry
